# Patient Record
Sex: Male | Born: 1970 | Race: Black or African American | Hispanic: No | Marital: Married | Smoking: Never smoker
Health system: Southern US, Community
[De-identification: ages and names within clinical notes are randomized; demographics above are authoritative.]

## PROBLEM LIST (undated history)

## (undated) DIAGNOSIS — F319 Bipolar disorder, unspecified: Secondary | ICD-10-CM

## (undated) DIAGNOSIS — F419 Anxiety disorder, unspecified: Secondary | ICD-10-CM

## (undated) DIAGNOSIS — E785 Hyperlipidemia, unspecified: Secondary | ICD-10-CM

## (undated) DIAGNOSIS — I10 Essential (primary) hypertension: Secondary | ICD-10-CM

## (undated) HISTORY — PX: OTHER SURGICAL HISTORY: SHX169

---

## 2013-07-01 ENCOUNTER — Encounter (HOSPITAL_COMMUNITY): Payer: Self-pay | Admitting: Emergency Medicine

## 2013-07-01 ENCOUNTER — Emergency Department (HOSPITAL_COMMUNITY)
Admission: EM | Admit: 2013-07-01 | Discharge: 2013-07-01 | Disposition: A | Discharge status: Home / Self Care | Payer: No Typology Code available for payment source | Attending: Emergency Medicine | Admitting: Emergency Medicine

## 2013-07-01 DIAGNOSIS — H18822 Corneal disorder due to contact lens, left eye: Secondary | ICD-10-CM

## 2013-07-01 DIAGNOSIS — Y9389 Activity, other specified: Secondary | ICD-10-CM | POA: Insufficient documentation

## 2013-07-01 DIAGNOSIS — Z79899 Other long term (current) drug therapy: Secondary | ICD-10-CM | POA: Insufficient documentation

## 2013-07-01 DIAGNOSIS — X58XXXA Exposure to other specified factors, initial encounter: Secondary | ICD-10-CM | POA: Insufficient documentation

## 2013-07-01 DIAGNOSIS — H18829 Corneal disorder due to contact lens, unspecified eye: Secondary | ICD-10-CM | POA: Insufficient documentation

## 2013-07-01 DIAGNOSIS — I10 Essential (primary) hypertension: Secondary | ICD-10-CM | POA: Insufficient documentation

## 2013-07-01 DIAGNOSIS — Y9289 Other specified places as the place of occurrence of the external cause: Secondary | ICD-10-CM | POA: Insufficient documentation

## 2013-07-01 DIAGNOSIS — F319 Bipolar disorder, unspecified: Secondary | ICD-10-CM | POA: Insufficient documentation

## 2013-07-01 DIAGNOSIS — S058X9A Other injuries of unspecified eye and orbit, initial encounter: Secondary | ICD-10-CM | POA: Insufficient documentation

## 2013-07-01 HISTORY — DX: Essential (primary) hypertension: I10

## 2013-07-01 HISTORY — DX: Bipolar disorder, unspecified: F31.9

## 2013-07-01 MED ORDER — CIPROFLOXACIN HCL 0.3 % OP SOLN: 1.0000 [drp] | OPHTHALMIC | Status: DC

## 2013-07-01 MED ORDER — TETRACAINE HCL 0.5 % OP SOLN
1.0000 [drp] | Freq: Once | OPHTHALMIC | Status: AC
  Administered 2013-07-01: 1 [drp] via OPHTHALMIC
  Filled 2013-07-01: qty 2

## 2013-07-01 MED ORDER — HYDROCODONE-ACETAMINOPHEN 5-325 MG PO TABS: 1.0000 | ORAL_TABLET | ORAL | Status: DC | PRN

## 2013-07-01 MED ORDER — FLUORESCEIN SODIUM 1 MG OP STRP
1.0000 | ORAL_STRIP | Freq: Once | OPHTHALMIC | Status: AC
  Administered 2013-07-01: 1 via OPHTHALMIC
  Filled 2013-07-01: qty 1

## 2013-07-01 NOTE — ED Notes (Signed)
States woke at 0200 today with left eye irritation. Got up and took out his contact. Left eye has become more irritated and painful.

## 2013-07-01 NOTE — ED Provider Notes (Signed)
CSN: [id number]     Arrival date & time 07/01/13  0935 History     First MD Initiated Contact with Patient 07/01/13 [phone]     Chief Complaint  Patient presents with  . Eye Pain   (Consider location/radiation/quality/duration/timing/severity/associated sxs/prior Treatment) HPI Comments: Patient is a 42 year old male presented to the emergency department with left eye pain that woke patient up this morning. Patient states he then took his contact out and it got more painful. He describes his pain as sharp and scratching and rates his pain 8/10 with no alleviating factors. The patient states his pain is worsened with light. Patient states he had been wearing his contact lens for over a day and typically does wear his lenses at night. Denies any recent contact lens cleaning solution use. Patient endorses that he has very poor vision without his contacts.    Past Medical History  Diagnosis Date  . Bipolar 1 disorder   . Hypertension    No past surgical history on file. No family history on file. History  Substance Use Topics  . Smoking status: Never Smoker   . Smokeless tobacco: Not on file  . Alcohol Use: Yes    Review of Systems  Constitutional: Negative for fever.  Eyes: Positive for pain and redness.  All other systems reviewed and are negative.    Allergies  Review of patient's allergies indicates no known allergies.  Home Medications   Current Outpatient Rx  Name  Route  Sig  Dispense  Refill  . ARIPiprazole (ABILIFY) 20 MG tablet   Oral   Take 20 mg by mouth daily.         . Armodafinil (NUVIGIL) 250 MG tablet   Oral   Take 250 mg by mouth daily.         Marland Kitchen atorvastatin (LIPITOR) 40 MG tablet   Oral   Take 40 mg by mouth daily.         Marland Kitchen buPROPion (WELLBUTRIN XL) 150 MG 24 hr tablet   Oral   Take 150 mg by mouth daily.         . Eszopiclone 3 MG TABS   Oral   Take 3 mg by mouth at bedtime. Take immediately before bedtime         . lamoTRIgine  (LAMICTAL) 200 MG tablet   Oral   Take 200 mg by mouth daily.         . Vilazodone HCl (VIIBRYD) 40 MG TABS   Oral   Take 40 mg by mouth daily.         . ciprofloxacin (CILOXAN) 0.3 % ophthalmic solution   Ophthalmic   Apply 1 drop to eye every 4 (four) hours. Place two drops in your left eye every 15 minutes for the first 6 hours followed by two drops in your left eye every 30 minutes for the rest of the first day. On day 2 place 2 drops in your left eye hourly. On days 3-14 place two drops every 4 hours.   2.5 mL   1   . HYDROcodone-acetaminophen (NORCO/VICODIN) 5-325 MG per tablet   Oral   Take 1 tablet by mouth every 4 (four) hours as needed for pain.   10 tablet   0    BP 141/89  Pulse 90  Temp(Src) 98.6 F (37 C) (Oral)  SpO2 96% Physical Exam  Constitutional: He is oriented to person, place, and time. He appears well-developed and well-nourished. No distress.  HENT:  Head: Normocephalic and atraumatic.  Eyes: EOM are normal. Pupils are equal, round, and reactive to light. No foreign body present in the left eye. Left conjunctiva is injected.  Fundoscopic exam:      The right eye shows no hemorrhage and no papilledema.       The left eye shows no hemorrhage and no papilledema.  Slit lamp exam:      The left eye shows corneal abrasion.  OD: 20/30 (with contact lens use) OS: 20/200 (no contact lens or eye glass use)   Neck: Neck supple.  Neurological: He is alert and oriented to person, place, and time.  Skin: Skin is warm and dry. He is not diaphoretic.  Psychiatric: He has a normal mood and affect.    ED Course   Procedures (including critical care time)  Labs Reviewed - No data to display No results found. 1. Corneal abrasion due to contact lens, left     MDM  Pt with corneal abrasion on PE most likely d/t contact lens use. Eye irrigated w NS, no evidence of FB. VA intact in OD (20/30) w/ contact lens use. OS: 20/200, but patient typically wears  contact lenses and/or glasses for distance and did not have such for VA testing today. Patient did endorse poor vision w/o corrective lenses. Pain did improve after tetracaine administration. Patient will be discharged home with cipro drops.   Patient understands to follow up with ophthalmology ASAP and advised to not wear contact lenses until cleared by ophthomologist, & to return to ER if new symptoms develop including change in vision, purulent drainage, or entrapment.Patient is agreeable to plan. Patient is stable at time of discharge      Jeannetta Ellis, PA-C 07/01/13 1405

## 2013-07-01 NOTE — ED Provider Notes (Signed)
Medical screening examination/treatment/procedure(s) were performed by non-physician practitioner and as supervising physician I was immediately available for consultation/collaboration.  Raeford Razor, MD 07/01/13 [phone]

## 2015-07-20 ENCOUNTER — Encounter (HOSPITAL_COMMUNITY): Payer: Self-pay | Admitting: Emergency Medicine

## 2015-07-20 ENCOUNTER — Emergency Department (HOSPITAL_COMMUNITY)
Admission: EM | Admit: 2015-07-20 | Discharge: 2015-07-21 | Disposition: A | Discharge status: Home / Self Care | Payer: No Typology Code available for payment source | Attending: Emergency Medicine | Admitting: Emergency Medicine

## 2015-07-20 DIAGNOSIS — T426X2A Poisoning by other antiepileptic and sedative-hypnotic drugs, intentional self-harm, initial encounter: Secondary | ICD-10-CM | POA: Insufficient documentation

## 2015-07-20 DIAGNOSIS — T50902A Poisoning by unspecified drugs, medicaments and biological substances, intentional self-harm, initial encounter: Secondary | ICD-10-CM

## 2015-07-20 DIAGNOSIS — Y9289 Other specified places as the place of occurrence of the external cause: Secondary | ICD-10-CM | POA: Diagnosis not present

## 2015-07-20 DIAGNOSIS — F39 Unspecified mood [affective] disorder: Secondary | ICD-10-CM | POA: Diagnosis not present

## 2015-07-20 DIAGNOSIS — T424X2A Poisoning by benzodiazepines, intentional self-harm, initial encounter: Secondary | ICD-10-CM | POA: Diagnosis not present

## 2015-07-20 DIAGNOSIS — F1310 Sedative, hypnotic or anxiolytic abuse, uncomplicated: Secondary | ICD-10-CM | POA: Insufficient documentation

## 2015-07-20 DIAGNOSIS — I10 Essential (primary) hypertension: Secondary | ICD-10-CM | POA: Insufficient documentation

## 2015-07-20 DIAGNOSIS — Z79899 Other long term (current) drug therapy: Secondary | ICD-10-CM | POA: Insufficient documentation

## 2015-07-20 DIAGNOSIS — Y9389 Activity, other specified: Secondary | ICD-10-CM | POA: Insufficient documentation

## 2015-07-20 DIAGNOSIS — F419 Anxiety disorder, unspecified: Secondary | ICD-10-CM | POA: Diagnosis not present

## 2015-07-20 DIAGNOSIS — Y998 Other external cause status: Secondary | ICD-10-CM | POA: Insufficient documentation

## 2015-07-20 DIAGNOSIS — F319 Bipolar disorder, unspecified: Secondary | ICD-10-CM | POA: Insufficient documentation

## 2015-07-20 LAB — COMPREHENSIVE METABOLIC PANEL
ALK PHOS: 79 U/L (ref 38–126)
ALT: 51 U/L (ref 17–63)
ANION GAP: 8 (ref 5–15)
AST: 37 U/L (ref 15–41)
Albumin: 4.9 g/dL (ref 3.5–5.0)
BILIRUBIN TOTAL: 0.6 mg/dL (ref 0.3–1.2)
BUN: 7 mg/dL (ref 6–20)
CALCIUM: 10.4 mg/dL — AB (ref 8.9–10.3)
CO2: 30 mmol/L (ref 22–32)
CREATININE: 1.3 mg/dL — AB (ref 0.61–1.24)
Chloride: 103 mmol/L (ref 101–111)
GFR calc non Af Amer: >60 mL/min (ref >60)
GLUCOSE: 101 mg/dL — AB (ref 65–99)
Potassium: 4.3 mmol/L (ref 3.5–5.1)
Sodium: 141 mmol/L (ref 135–145)
TOTAL PROTEIN: 8.7 g/dL — AB (ref 6.5–8.1)

## 2015-07-20 LAB — URINALYSIS, ROUTINE W REFLEX MICROSCOPIC
BILIRUBIN URINE: NEGATIVE
GLUCOSE, UA: NEGATIVE mg/dL
HGB URINE DIPSTICK: NEGATIVE
Ketones, ur: NEGATIVE mg/dL
Leukocytes, UA: NEGATIVE
Nitrite: NEGATIVE
Protein, ur: NEGATIVE mg/dL
SPECIFIC GRAVITY, URINE: 1.007 (ref 1.005–1.030)
UROBILINOGEN UA: 0.2 mg/dL (ref 0.0–1.0)
pH: 6 (ref 5.0–8.0)

## 2015-07-20 LAB — RAPID URINE DRUG SCREEN, HOSP PERFORMED
AMPHETAMINES: NOT DETECTED
BARBITURATES: NOT DETECTED
Benzodiazepines: POSITIVE — AB
Cocaine: NOT DETECTED
OPIATES: NOT DETECTED
TETRAHYDROCANNABINOL: NOT DETECTED

## 2015-07-20 LAB — CBC WITH DIFFERENTIAL/PLATELET
Basophils Absolute: 0 10*3/uL (ref 0.0–0.1)
Basophils Relative: 0 % (ref 0–1)
Eosinophils Absolute: 0.1 10*3/uL (ref 0.0–0.7)
Eosinophils Relative: 1 % (ref 0–5)
HEMATOCRIT: 47.6 % (ref 39.0–52.0)
HEMOGLOBIN: 16.4 g/dL (ref 13.0–17.0)
LYMPHS ABS: 2.3 10*3/uL (ref 0.7–4.0)
LYMPHS PCT: 34 % (ref 12–46)
MCH: 31.3 pg (ref 26.0–34.0)
MCHC: 34.5 g/dL (ref 30.0–36.0)
MCV: 90.8 fL (ref 78.0–100.0)
MONOS PCT: 3 % (ref 3–12)
Monocytes Absolute: 0.2 10*3/uL (ref 0.1–1.0)
NEUTROS ABS: 4.2 10*3/uL (ref 1.7–7.7)
NEUTROS PCT: 62 % (ref 43–77)
Platelets: 218 10*3/uL (ref 150–400)
RBC: 5.24 MIL/uL (ref 4.22–5.81)
RDW: 12.8 % (ref 11.5–15.5)
WBC: 6.8 10*3/uL (ref 4.0–10.5)

## 2015-07-20 LAB — LIPASE, BLOOD: Lipase: 25 U/L (ref 22–51)

## 2015-07-20 LAB — SALICYLATE LEVEL

## 2015-07-20 LAB — ACETAMINOPHEN LEVEL

## 2015-07-20 MED ORDER — GI COCKTAIL ~~LOC~~: 30.0000 mL | Freq: Once | ORAL | Status: DC

## 2015-07-20 NOTE — ED Notes (Signed)
Pt. Noted in room. No complaints or concerns voiced. No distress or abnormal behavior noted. Will continue to monitor with security cameras. Q 15 minute rounds continue. 

## 2015-07-20 NOTE — ED Provider Notes (Addendum)
CSN: [id number]     Arrival date & time 07/20/15  1034 History   First MD Initiated Contact with Patient 07/20/15 1048     Chief Complaint  Patient presents with  . Drug Overdose     (Consider location/radiation/quality/duration/timing/severity/associated sxs/prior Treatment) Patient is a 44 y.o. male presenting with Overdose.  Drug Overdose This is a new problem. The current episode started yesterday. The problem occurs constantly. The problem has not changed since onset.Pertinent negatives include no chest pain, no abdominal pain, no headaches and no shortness of breath. Nothing aggravates the symptoms. Nothing relieves the symptoms. He has tried nothing for the symptoms. The treatment provided no relief.   44 yo M with a chief complaint of a drug overdose. Patient took 4 4 mg Lunesta, as well as for half milligram Xanax tablets. He stated today's an attempt to kill himself. States he's been having off and on lower crampy abdominal pain, for the past couple weeks. Patient takes associated with some nausea. Denies vomiting. Denies fevers or chills. Sinus PCP yesterday and was prescribed omeprazole, so far no relief. Patient has been manic for the past 3 or 4 days. Patient is been without his Seroquel for that amount of time. States he spent more than $3500.   Past Medical History  Diagnosis Date  . Bipolar 1 disorder   . Hypertension    History reviewed. No pertinent past surgical history. No family history on file. Social History  Substance Use Topics  . Smoking status: Never Smoker   . Smokeless tobacco: None  . Alcohol Use: Yes    Review of Systems  Constitutional: Negative for fever and chills.  HENT: Negative for congestion and facial swelling.   Eyes: Negative for discharge and visual disturbance.  Respiratory: Negative for shortness of breath.   Cardiovascular: Negative for chest pain and palpitations.  Gastrointestinal: Negative for vomiting, abdominal pain and diarrhea.   Musculoskeletal: Negative for myalgias and arthralgias.  Skin: Negative for color change and rash.  Neurological: Negative for tremors, syncope and headaches.  Psychiatric/Behavioral: Negative for confusion and dysphoric mood.      Allergies  Review of patient's allergies indicates no known allergies.  Home Medications   Prior to Admission medications   Medication Sig Start Date End Date Taking? Authorizing Provider  ALPRAZolam Prudy Feeler) 0.5 MG tablet Take 0.5 mg by mouth daily as needed. 05/14/15  Yes Historical Provider, MD  Armodafinil (NUVIGIL) 250 MG tablet Take 250 mg by mouth daily.   Yes Historical Provider, MD  atorvastatin (LIPITOR) 40 MG tablet Take 40 mg by mouth daily.   Yes Historical Provider, MD  buPROPion (WELLBUTRIN XL) 150 MG 24 hr tablet Take 150 mg by mouth daily.   Yes Historical Provider, MD  Eszopiclone 3 MG TABS Take 3 mg by mouth at bedtime. Take immediately before bedtime   Yes Historical Provider, MD  lamoTRIgine (LAMICTAL) 200 MG tablet Take 200 mg by mouth daily.   Yes Historical Provider, MD  omeprazole (PRILOSEC) 20 MG capsule Take 20 mg by mouth daily. 07/18/15  Yes Historical Provider, MD  SEROQUEL XR 400 MG 24 hr tablet Take 800 mg by mouth at bedtime as needed. 07/19/15  Yes Historical Provider, MD  valACYclovir (VALTREX) 500 MG tablet Take 500 mg by mouth. Takes for 5 days 05/25/15  Yes Historical Provider, MD  ciprofloxacin (CILOXAN) 0.3 % ophthalmic solution Apply 1 drop to eye every 4 (four) hours. Place two drops in your left eye every 15 minutes for the  first 6 hours followed by two drops in your left eye every 30 minutes for the rest of the first day. On day 2 place 2 drops in your left eye hourly. On days 3-14 place two drops every 4 hours. Patient not taking: Reported on 07/20/2015 07/01/13   Francee Piccolo, PA-C  HYDROcodone-acetaminophen (NORCO/VICODIN) 5-325 MG per tablet Take 1 tablet by mouth every 4 (four) hours as needed for pain. Patient  not taking: Reported on 07/20/2015 07/01/13   Victorino Dike Piepenbrink, PA-C   BP 115/92 mmHg  Pulse 76  Temp(Src) 98.3 F (36.8 C) (Oral)  Resp 18  Ht  (1.803 m)  Wt 217 lb (98.431 kg)  BMI 30.28 kg/m2  SpO2 98% Physical Exam  Constitutional: He is oriented to person, place, and time. He appears well-developed and well-nourished.  HENT:  Head: Normocephalic and atraumatic.  Eyes: EOM are normal. Pupils are equal, round, and reactive to light.  Neck: Normal range of motion. Neck supple. No JVD present.  Cardiovascular: Normal rate and regular rhythm.  Exam reveals no gallop and no friction rub.   No murmur heard. Pulmonary/Chest: No respiratory distress. He has no wheezes.  Abdominal: He exhibits no distension. There is no rebound and no guarding.  Musculoskeletal: Normal range of motion.  Neurological: He is alert and oriented to person, place, and time.  Skin: No rash noted. No pallor.  Psychiatric: He has a normal mood and affect. His behavior is normal.    ED Course  Procedures (including critical care time) Labs Review Labs Reviewed  COMPREHENSIVE METABOLIC PANEL - Abnormal; Notable for the following:    Glucose, Bld 101 (*)    Creatinine, Ser 1.30 (*)    Calcium 10.4 (*)    Total Protein 8.7 (*)    All other components within normal limits  URINE RAPID DRUG SCREEN, HOSP PERFORMED - Abnormal; Notable for the following:    Benzodiazepines POSITIVE (*)    All other components within normal limits  ACETAMINOPHEN LEVEL - Abnormal; Notable for the following:    Acetaminophen (Tylenol), Serum <10 (*)    All other components within normal limits  CBC WITH DIFFERENTIAL/PLATELET  LIPASE, BLOOD  URINALYSIS, ROUTINE W REFLEX MICROSCOPIC (NOT AT [facility])  SALICYLATE LEVEL    Imaging Review No results found. I have personally reviewed and evaluated these images and lab results as part of my medical decision-making.   EKG Interpretation None      MDM   Final diagnoses:   Overdose, intentional self-harm, initial encounter    44 yo M with a chief complaint of suicidal ideation drug overdose. Patient also having lower abdominal pain. Benign abdominal exam. Will check abdominal labs. Patient very alert for metastatic medication that he took. Feel possible patient did not take these medications.  Evaluated by psych, will observe for 24 hours and reassess in the morning.    Melene Plan, DO 07/20/15 1416  Melene Plan, DO 07/20/15 1416

## 2015-07-20 NOTE — ED Notes (Signed)
Report received from Karen RN. Pt. Sleeping, respirations regular and unlabored. Will continue to monitor for safety via security cameras and Q 15 minute checks. 

## 2015-07-20 NOTE — Discharge Instructions (Signed)
Accidental Overdose  A drug overdose occurs when a chemical substance (drug or medication) is used in amounts large enough to overcome a person. This may result in severe illness or death. This is a type of poisoning. Accidental overdoses of medications or other substances come from a variety of reasons. When this happens accidentally, it is often because the person taking the substance does not know enough about what they have taken. Drugs which commonly cause overdose deaths are alcohol, psychotropic medications (medications which affect the mind), pain medications, illegal drugs (street drugs) such as cocaine and heroin, and multiple drugs taken at the same time. It may result from careless behavior (such as over-indulging at a party). Other causes of overdose may include multiple drug use, a lapse in memory, or drug use after a period of no drug use.   Sometimes overdosing occurs because a person cannot remember if they have taken their medication.   A common unintentional overdose in young children involves multi-vitamins containing iron. Iron is a part of the hemoglobin molecule in blood. It is used to transport oxygen to living cells. When taken in small amounts, iron allows the body to restock hemoglobin. In large amounts, it causes problems in the body. If this overdose is not treated, it can lead to death.  Never take medicines that show signs of tampering or do not seem quite right. Never take medicines in the dark or in poor lighting. Read the label and check each dose of medicine before you take it. When adults are poisoned, it happens most often through carelessness or lack of information. Taking medicines in the dark or taking medicine prescribed for someone else to treat the same type of problem is a dangerous practice.  SYMPTOMS   Symptoms of overdose depend on the medication and amount taken. They can vary from over-activity with stimulant over-dosage, to sleepiness from depressants such as  alcohol, narcotics and tranquilizers. Confusion, dizziness, nausea and vomiting may be present. If problems are severe enough coma and death may result.  DIAGNOSIS   Diagnosis and management are generally straightforward if the drug is known. Otherwise it is more difficult. At times, certain symptoms and signs exhibited by the patient, or blood tests, can reveal the drug in question.   TREATMENT   In an emergency department, most patients can be treated with supportive measures. Antidotes may be available if there has been an overdose of opioids or benzodiazepines. A rapid improvement will often occur if this is the cause of overdose.  At home or away from medical care:   There may be no immediate problems or warning signs in children.   Not everything works well in all cases of poisoning.   Take immediate action. Poisons may act quickly.   If you think someone has swallowed medicine or a household product, and the person is unconscious, having seizures (convulsions), or is not breathing, immediately call for an ambulance.  IF a person is conscious and appears to be doing OK but has swallowed a poison:   Do not wait to see what effect the poison will have. Immediately call a poison control center (listed in the white pages of your telephone book under "Poison Control" or inside the front cover with other emergency numbers). Some poison control centers have TTY capability for the deaf. Check with your local center if you or someone in your family requires this service.   Keep the container so you can read the label on the product for ingredients.     Describe what, when, and how much was taken and the age and condition of the person poisoned. Inform them if the person is vomiting, choking, drowsy, shows a change in color or temperature of skin, is conscious or unconscious, or is convulsing.   Do not cause vomiting unless instructed by medical personnel. Do not induce vomiting or force liquids into a person who  is convulsing, unconscious, or very drowsy.  Stay calm and in control.    Activated charcoal also is sometimes used in certain types of poisoning and you may wish to add a supply to your emergency medicines. It is available without a prescription. Call a poison control center before using this medication.  PREVENTION   Thousands of children die every year from unintentional poisoning. This may be from household chemicals, poisoning from carbon monoxide in a car, taking their parent's medications, or simply taking a few iron pills or vitamins with iron. Poisoning comes from unexpected sources.   Store medicines out of the sight and reach of children, preferably in a locked cabinet. Do not keep medications in a food cabinet. Always store your medicines in a secure place. Get rid of expired medications.   If you have children living with you or have them as occasional guests, you should have child-resistant caps on your medicine containers. Keep everything out of reach. Child proof your home.   If you are called to the telephone or to answer the door while you are taking a medicine, take the container with you or put the medicine out of the reach of small children.   Do not take your medication in front of children. Do not tell your child how good a medication is and how good it is for them. They may get the idea it is more of a treat.   If you are an adult and have accidentally taken an overdose, you need to consider how this happened and what can be done to prevent it from happening again. If this was from a street drug or alcohol, determine if there is a problem that needs addressing. If you are not sure a problems exists, it is easy to talk to a professional and ask them if they think you have a problem. It is better to handle this problem in this way before it happens again and has a much worse consequence.  Document Released: 01/17/2005 Document Revised: 01/26/2012 Document Reviewed: 06/25/2009  [employer]  Patient Information 2015 [employer]. This information is not intended to replace advice given to you by your health care provider. Make sure you discuss any questions you have with your health care provider.

## 2015-07-20 NOTE — ED Notes (Signed)
Pt. Noted sleeping in room. No complaints or concerns voiced. No distress or abnormal behavior noted. Will continue to monitor with security cameras. Q 15 minute rounds continue. 

## 2015-07-20 NOTE — BH Assessment (Addendum)
Tele Assessment Note   Michael Mclean is an 44 y.o. male. Patient is a 44 year old male that presents this date with Bipolar D/O and over dosed on his anxiety/sleep medications. Patient stated that he had been having stomach issues for the last few weeks and increased anxiety from work feeling very over whelmed. Patient admitted to taking 4 tablets of Xanax and 4 tablets of Lunesta this date stating he, "didn't care if he woke up or not." Patient stated he has been so frustrated with excessive debt from a recent bipolar episode where he charged a large amount on his credit cards that he did not want to "face life anymore." Patient denies any more thoughts of self harm this date and was remorseful in reference to his actions. Patients denies any SA issues but does admit to being off his Bipolar medications for the last week due to him forgetting to pick them up. Patient stated he would like to go home and stated he had no further thoughts of self harm. Patient also stated that they would make sure they obtained their medication/s and was educated on the importance of medication compliance.Collateral information obtained from his girlfriend (who was present and signed a release) confirmed that patient had not been on his medications for some time and had noticed patient had been decompensating. This Clinical research associate staffed the case with Michael Nottingham NP who after reviewing the above stated patient will be monitored for the next 24 hours and re-evaluated in the a.m. Patient agreed and this Clinical research associate informed ED MD Michael Mclean of the disposition.       Axis I: 296.40 Bipolar Axis II: Deferred Axis III: GERD Past Medical History  Diagnosis Date  . Bipolar 1 disorder   . Hypertension    Axis IV: occupational problems and problems related to social environment Axis V: 51-60 moderate symptoms  Past Medical History:  Past Medical History  Diagnosis Date  . Bipolar 1 disorder   . Hypertension     History reviewed. No pertinent  past surgical history.  Family History: No family history on file.  Social History:  reports that he has never smoked. He does not have any smokeless tobacco history on file. He reports that he drinks alcohol. He reports that he does not use illicit drugs.  Additional Social History:  Alcohol / Drug Use Pain Medications: None noted see MAR Prescriptions: See MAR Over the Counter: See MAR History of alcohol / drug use?: No history of alcohol / drug abuse  CIWA: CIWA-Ar BP: 132/91 mmHg Pulse Rate: 88 COWS:    PATIENT STRENGTHS: (choose at least two) Ability for insight Average or above average intelligence Capable of independent living Communication skills Religious Affiliation Supportive family/friends  Allergies: No Known Allergies  Home Medications:  (Not in a hospital admission)  OB/GYN Status:  No LMP for male patient.  General Assessment Data Location of Assessment: [facility] TTS Assessment: In system Is this a Tele or Face-to-Face Assessment?: Face-to-Face Is this an Initial Assessment or a Re-assessment for this encounter?: Initial Assessment Marital status: Long term relationship Michael Mclean name: NA Is patient pregnant?: No Pregnancy Status: No Living Arrangements: Spouse/significant other Can pt return to current living arrangement?: Yes Admission Status: Voluntary Is patient capable of signing voluntary admission?: Yes Referral Source: Self/Family/Friend Insurance type: Proofreader Exam [facility] Walk-in ONLY) Medical Exam completed: Yes  Crisis Care Plan Living Arrangements: Spouse/significant other Name of Psychiatrist: Tiajuana Mclean Name of Therapist: None at this time  Education Status Is patient currently in school?: No Current Grade: NA Highest grade of school patient has completed: BS Name of school: [employer] Contact person: Michael Mclean  Risk to self with the past 6 months Suicidal Ideation: No Has patient been a risk to  self within the past 6 months prior to admission? : No Suicidal Intent: No Has patient had any suicidal intent within the past 6 months prior to admission? : Yes Is patient at risk for suicide?: No (Patient overdosed on anxiety/sleep medications) Suicidal Plan?: No Has patient had any suicidal plan within the past 6 months prior to admission? : No Access to Means: Yes Specify Access to Suicidal Means: Medications What has been your use of drugs/alcohol within the last 12 months?: None Previous Attempts/Gestures: No How many times?: 0 Other Self Harm Risks: None noted Triggers for Past Attempts: Other (Comment) Barrister's clerk) Intentional Self Injurious Behavior: None Family Suicide History: No Recent stressful life event(s): Financial Problems Persecutory voices/beliefs?: No Depression: No Depression Symptoms: Feeling angry/irritable Substance abuse history and/or treatment for substance abuse?: No Suicide prevention information given to non-admitted patients: Not applicable  Risk to Others within the past 6 months Homicidal Ideation: No Does patient have any lifetime risk of violence toward others beyond the six months prior to admission? : No Thoughts of Harm to Others: No Current Homicidal Intent: No Current Homicidal Plan: No Access to Homicidal Means: No Identified Victim: NA History of harm to others?: No Assessment of Violence: None Noted Violent Behavior Description: None Does patient have access to weapons?: No Criminal Charges Pending?: No Does patient have a court date: No Is patient on probation?: No  Psychosis Hallucinations: None noted Delusions: None noted  Mental Status Report Appearance/Hygiene: In scrubs Eye Contact: Fair Motor Activity: Unremarkable Speech: Unremarkable Level of Consciousness: Drowsy Mood: Sullen Affect: Sullen Anxiety Level: Minimal Thought Processes: Coherent Judgement: Unimpaired Orientation: Person, Place, Time,  Situation Obsessive Compulsive Thoughts/Behaviors: None  Cognitive Functioning Concentration: Fair Memory: Recent Intact, Remote Intact IQ: Above Average Insight: Good Impulse Control: Good Appetite: Good Weight Loss: 0 Weight Gain: 0 Sleep: No Change Total Hours of Sleep: 8 Vegetative Symptoms: None  ADLScreening [facility] Assessment Services) Patient's cognitive ability adequate to safely complete daily activities?: Yes Patient able to express need for assistance with ADLs?: Yes Independently performs ADLs?: Yes (appropriate for developmental age)  Prior Inpatient Therapy Prior Inpatient Therapy: No Prior Therapy Dates: None Prior Therapy Facilty/Provider(s): [facility] Reason for Treatment: Bipolar  Prior Outpatient Therapy Prior Outpatient Therapy: No Prior Therapy Dates: NA Prior Therapy Facilty/Provider(s): [facility] Reason for Treatment: Bipolar Does patient have an ACCT team?: No Does patient have Intensive In-House Services?  : No Does patient have [facility] services? : No Does patient have P4CC services?: No  ADL Screening (condition at time of admission) Patient's cognitive ability adequate to safely complete daily activities?: Yes Is the patient deaf or have difficulty hearing?: No Does the patient have difficulty seeing, even when wearing glasses/contacts?: No Does the patient have difficulty concentrating, remembering, or making decisions?: No Patient able to express need for assistance with ADLs?: Yes Does the patient have difficulty dressing or bathing?: No Independently performs ADLs?: Yes (appropriate for developmental age) Weakness of Legs: None Weakness of Arms/Hands: None  Home Assistive Devices/Equipment Home Assistive Devices/Equipment: None  Therapy Consults (therapy consults require a physician order) PT Evaluation Needed: No OT Evalulation Needed: No SLP Evaluation Needed: No Abuse/Neglect Assessment (Assessment to be complete while patient  is alone) Physical Abuse: Denies Verbal Abuse:  Denies Sexual Abuse: Denies Exploitation of patient/patient's resources: Denies Self-Neglect: Denies Values / Beliefs Cultural Requests During Hospitalization: None Spiritual Requests During Hospitalization: None Consults Spiritual Care Consult Needed: No Social Work Consult Needed: No Merchant navy officer (For Healthcare) Does patient have an advance directive?: No Would patient like information on creating an advanced directive?: No - patient declined information    Additional Information 1:1 In Past 12 Months?: No CIRT Risk: No Elopement Risk: No Does patient have medical clearance?: Yes     Disposition: This Clinical research associate staffed the case with Michael Nottingham NP who after reviewing the above stated patient will be monitored for the next 24 hours and re-evaluated in the a.m. Patient agreed and this Clinical research associate informed ED MD Michael Mclean of the disposition.       Disposition Initial Assessment Completed for this Encounter: Yes Disposition of Patient: Other dispositions (Re-evaluate in 24 hours) Other disposition(s): Other (Comment) (Re-evaluate in 24 hours)  Alfredia Ferguson 07/20/2015 1:55 PM

## 2015-07-20 NOTE — ED Notes (Signed)
Bed: [facility] Expected date:  Expected time:  Means of arrival:  Comments: Seroquel, lunesta, and xanax

## 2015-07-20 NOTE — ED Notes (Signed)
Pt was oriented to unit. Explained security monitoring. He denied SI/HI/AVH and pain. Gave him a blanket, sandwich, snack, and Ginger Ale. Will continue to monitor for needs/safety.

## 2015-07-20 NOTE — ED Notes (Signed)
From MD office (psych) via EMS, pt reports 8 4 mg Lunesta in 12 hr (4 two hours ago) and 6-8 0.5 Xanax pta, c/o abd pain and nausea,  Zofran pta, EMS reports SI pta, pt endorses manic behavior, denies HI/SI on arrival.

## 2015-07-20 NOTE — ED Notes (Signed)
Per poison control, CNS and resp depression, labs, EKG, airway support, obs 6-8 hours or until at baseline

## 2015-07-20 NOTE — ED Notes (Signed)
Spoke with Collene Leyden RN from [employer]. She was satisfied with his vitals, ECG, and labs. No further follow-up.

## 2015-07-21 DIAGNOSIS — F419 Anxiety disorder, unspecified: Secondary | ICD-10-CM | POA: Diagnosis not present

## 2015-07-21 DIAGNOSIS — F319 Bipolar disorder, unspecified: Secondary | ICD-10-CM | POA: Insufficient documentation

## 2015-07-21 DIAGNOSIS — F39 Unspecified mood [affective] disorder: Secondary | ICD-10-CM | POA: Diagnosis not present

## 2015-07-21 NOTE — [facility] Suicide Risk Assessment (Cosign Needed)
Suicide Risk Assessment  Discharge Assessment   [facility] Discharge Suicide Risk Assessment   Demographic Factors:  Male  Total Time spent with patient: 20 minutes  Musculoskeletal: Strength & Muscle Tone: within normal limits Gait & Station: normal Patient leans: N/A  Psychiatric Specialty Exam:     Blood pressure 125/87, pulse 77, temperature 98.4 F (36.9 C), temperature source Oral, resp. rate 18, height  (1.803 m), weight 98.431 kg (217 lb), SpO2 100 %.Body mass index is 30.28 kg/(m^2).  General Appearance: Casual and Fairly Groomed  Patent attorney::  Good  Speech:  Clear and Coherent and Normal Rate  Volume:  Normal  Mood:  Anxious  Affect:  Congruent  Thought Process:  Coherent, Goal Directed and Intact  Orientation:  Full (Time, Place, and Person)  Thought Content:  WDL  Suicidal Thoughts:  No  Homicidal Thoughts:  No  Memory:  Immediate;   Good Recent;   Good Remote;   Good  Judgement:  Intact  Insight:  Good  Psychomotor Activity:  Normal  Concentration:  Good  Recall:  NA  Fund of Knowledge:Good  Language: Good  Akathisia:  No  Handed:  Right  AIMS (if indicated):     Assets:  Desire for Improvement  ADL's:  Intact  Cognition: WNL        Has this patient used any form of tobacco in the last 30 days? (Cigarettes, Smokeless Tobacco, Cigars, and/or Pipes) N/A  Mental Status Per Nursing Assessment::   On Admission:     Current Mental Status by Physician: NA  Loss Factors: NA  Historical Factors: NA  Risk Reduction Factors:   Sense of responsibility to family, Religious beliefs about death, Employed, Living with another person, especially a relative and Positive therapeutic relationship  Continued Clinical Symptoms:  Severe Anxiety and/or Agitation Bipolar Disorder:   Depressive phase  Cognitive Features That Contribute To Risk:  Polarized thinking    Suicide Risk:  Minimal: No identifiable suicidal ideation.  Patients presenting with no  risk factors but with morbid ruminations; may be classified as minimal risk based on the severity of the depressive symptoms  Principal Problem: Mood disorder Discharge Diagnoses:  Patient Active Problem List   Diagnosis Date Noted  . Mood disorder [F39] 07/21/2015    Priority: High  . Anxiety disorder [F41.9]   . Bipolar affective disorder [F31.9]     Follow-up Information    Schedule an appointment as soon as possible for a visit with PARTRIDGE,JAMES, MD.   Specialty:  Internal Medicine      Plan Of Care/Follow-up recommendations:  Activity:  as tolerated Diet:  regular  Is patient on multiple antipsychotic therapies at discharge:  No   Has Patient had three or more failed trials of antipsychotic monotherapy by history:  No  Recommended Plan for Multiple Antipsychotic Therapies: NA    Parlee Amescua C   PMHNP-BC 07/21/2015, 12:56 PM

## 2015-07-21 NOTE — ED Notes (Signed)
Pt. Noted sleeping in room. No complaints or concerns voiced. No distress or abnormal behavior noted. Will continue to monitor with security cameras. Q 15 minute rounds continue. 

## 2015-07-21 NOTE — Consult Note (Signed)
Michael Mclean   Reason for Mclean: Unspecified mood disorder, Anxiety disorder, Bipolar 1 disorder hx Referring Physician:  EDP Patient Identification: Michael Mclean MRN:  [id number] Principal Diagnosis: Mood disorder Diagnosis:   Patient Active Problem List   Diagnosis Date Noted  . Mood disorder [F39] 07/21/2015    Priority: High    Total Time spent with patient: 1 hour  Subjective:   Michael Mclean is a 44 y.o. male patient admitted with Unspecified mood disorder, Anxiety disorder, Bipolar 1 disorder hx.  HPI:  AA male, 44 years old was evaluated this morning for anxiety and taking extra dose of his sleeping and anxiety medication.  He vehemently denies taking these extra 2 pills each to kill himself but stated that he was anxious and could not sleep.  Patient admits to a hx of Bipolar disorder and insomnia.  His Seroquel was increased to 800 mg at night time with the addition of Xanax and Lunester for anxiety and sleep respectively.  He also started a new medications for GI discomfort.  He states that he panicked after he took the first dose of the GI medications because his pain and discomfort got even worse.  He panicked and took 2 extra dose of Xanax and Lunester to allow him sleep and relax.  He denies SI/HI/AVH and denies previous suicide attempt.  He denies feeling hopeless or helpless and he started seeing DR Clovis Pu at [facility] for his [facility] care.  Patient has an upcoming  appointment with Dr Clovis Pu later this month.  Patient is discharged home now.  HPI Elements:   Location:  Unspecified mood disorder, Anxiety disorder, Bipolar disorder 1, . Quality:  Moderate-severe. Severity:  Moderate-severe. Timing:  Acute. Context:  Came in seeking assistance for anxiety..  Past Medical History:  Past Medical History  Diagnosis Date  . Bipolar 1 disorder   . Hypertension    History reviewed. No pertinent past surgical history. Family History: No family  history on file. Social History:  History  Alcohol Use  . Yes     History  Drug Use No    Social History   Social History  . Marital Status: Single    Spouse Name: N/A  . Number of Children: N/A  . Years of Education: N/A   Social History Main Topics  . Smoking status: Never Smoker   . Smokeless tobacco: None  . Alcohol Use: Yes  . Drug Use: No  . Sexual Activity: Not Asked   Other Topics Concern  . None   Social History Narrative   Additional Social History:    Pain Medications: None noted see MAR Prescriptions: See MAR Over the Counter: See MAR History of alcohol / drug use?: No history of alcohol / drug abuse                     Allergies:  No Known Allergies  Labs:  Results for orders placed or performed during the hospital encounter of 07/20/15 (from the past 48 hour(s))  Urinalysis, Routine w reflex microscopic (not at [facility])     Status: None   Collection Time: 07/20/15 11:20 AM  Result Value Ref Range   Color, Urine YELLOW YELLOW   APPearance CLEAR CLEAR   Specific Gravity, Urine 1.007 1.005 - 1.030   pH 6.0 5.0 - 8.0   Glucose, UA NEGATIVE NEGATIVE mg/dL   Hgb urine dipstick NEGATIVE NEGATIVE   Bilirubin Urine NEGATIVE NEGATIVE   Ketones, ur NEGATIVE NEGATIVE  mg/dL   Protein, ur NEGATIVE NEGATIVE mg/dL   Urobilinogen, UA 0.2 0.0 - 1.0 mg/dL   Nitrite NEGATIVE NEGATIVE   Leukocytes, UA NEGATIVE NEGATIVE    Comment: MICROSCOPIC NOT DONE ON URINES WITH NEGATIVE PROTEIN, BLOOD, LEUKOCYTES, NITRITE, OR GLUCOSE <1000 mg/dL.  Urine rapid drug screen (hosp performed)     Status: Abnormal   Collection Time: 07/20/15 11:20 AM  Result Value Ref Range   Opiates NONE DETECTED NONE DETECTED   Cocaine NONE DETECTED NONE DETECTED   Benzodiazepines POSITIVE (A) NONE DETECTED   Amphetamines NONE DETECTED NONE DETECTED   Tetrahydrocannabinol NONE DETECTED NONE DETECTED   Barbiturates NONE DETECTED NONE DETECTED    Comment:        DRUG SCREEN FOR MEDICAL  PURPOSES ONLY.  IF CONFIRMATION IS NEEDED FOR ANY PURPOSE, NOTIFY LAB WITHIN 5 DAYS.        LOWEST DETECTABLE LIMITS FOR URINE DRUG SCREEN Drug Class       Cutoff (ng/mL) Amphetamine      1000 Barbiturate      200 Benzodiazepine   532 Tricyclics       992 Opiates          300 Cocaine          300 THC              50   CBC with Differential     Status: None   Collection Time: 07/20/15 11:29 AM  Result Value Ref Range   WBC 6.8 4.0 - 10.5 K/uL   RBC 5.24 4.22 - 5.81 MIL/uL   Hemoglobin 16.4 13.0 - 17.0 g/dL   HCT 47.6 39.0 - 52.0 %   MCV 90.8 78.0 - 100.0 fL   MCH 31.3 26.0 - 34.0 pg   MCHC 34.5 30.0 - 36.0 g/dL   RDW 12.8 11.5 - 15.5 %   Platelets 218 150 - 400 K/uL   Neutrophils Relative % 62 43 - 77 %   Neutro Abs 4.2 1.7 - 7.7 K/uL   Lymphocytes Relative 34 12 - 46 %   Lymphs Abs 2.3 0.7 - 4.0 K/uL   Monocytes Relative 3 3 - 12 %   Monocytes Absolute 0.2 0.1 - 1.0 K/uL   Eosinophils Relative 1 0 - 5 %   Eosinophils Absolute 0.1 0.0 - 0.7 K/uL   Basophils Relative 0 0 - 1 %   Basophils Absolute 0.0 0.0 - 0.1 K/uL  Comprehensive metabolic panel     Status: Abnormal   Collection Time: 07/20/15 11:29 AM  Result Value Ref Range   Sodium 141 135 - 145 mmol/L   Potassium 4.3 3.5 - 5.1 mmol/L   Chloride 103 101 - 111 mmol/L   CO2 30 22 - 32 mmol/L   Glucose, Bld 101 (H) 65 - 99 mg/dL   BUN 7 6 - 20 mg/dL   Creatinine, Ser 1.30 (H) 0.61 - 1.24 mg/dL   Calcium 10.4 (H) 8.9 - 10.3 mg/dL   Total Protein 8.7 (H) 6.5 - 8.1 g/dL   Albumin 4.9 3.5 - 5.0 g/dL   AST 37 15 - 41 U/L   ALT 51 17 - 63 U/L   Alkaline Phosphatase 79 38 - 126 U/L   Total Bilirubin 0.6 0.3 - 1.2 mg/dL   GFR calc non Af Amer >60 >60 mL/min   GFR calc Af Amer >60 >60 mL/min    Comment: (NOTE) The eGFR has been calculated using the CKD EPI equation. This calculation has not been  validated in all clinical situations. eGFR's persistently <60 mL/min signify possible Chronic Kidney Disease.    Anion  gap 8 5 - 15  Lipase, blood     Status: None   Collection Time: 07/20/15 11:29 AM  Result Value Ref Range   Lipase 25 22 - 51 U/L  Acetaminophen level     Status: Abnormal   Collection Time: 07/20/15 11:29 AM  Result Value Ref Range   Acetaminophen (Tylenol), Serum <10 (L) 10 - 30 ug/mL    Comment:        THERAPEUTIC CONCENTRATIONS VARY SIGNIFICANTLY. A RANGE OF 10-30 ug/mL MAY BE AN EFFECTIVE CONCENTRATION FOR MANY PATIENTS. HOWEVER, SOME ARE BEST TREATED AT CONCENTRATIONS OUTSIDE THIS RANGE. ACETAMINOPHEN CONCENTRATIONS >150 ug/mL AT 4 HOURS AFTER INGESTION AND >50 ug/mL AT 12 HOURS AFTER INGESTION ARE OFTEN ASSOCIATED WITH TOXIC REACTIONS.   Salicylate level     Status: None   Collection Time: 07/20/15 11:29 AM  Result Value Ref Range   Salicylate Lvl <8.4 2.8 - 30.0 mg/dL    Vitals: Blood pressure 122/82, pulse 81, temperature 98 F (36.7 C), temperature source Oral, resp. rate 20, height 5' 11" (1.803 m), weight 98.431 kg (217 lb), SpO2 97 %.  Risk to Self: Suicidal Ideation: No Suicidal Intent: No Is patient at risk for suicide?: No (Patient overdosed on anxiety/sleep medications) Suicidal Plan?: No Access to Means: Yes Specify Access to Suicidal Means: Medications What has been your use of drugs/alcohol within the last 12 months?: None How many times?: 0 Other Self Harm Risks: None noted Triggers for Past Attempts: Other (Comment) (Financial) Intentional Self Injurious Behavior: None Risk to Others: Homicidal Ideation: No Thoughts of Harm to Others: No Current Homicidal Intent: No Current Homicidal Plan: No Access to Homicidal Means: No Identified Victim: NA History of harm to others?: No Assessment of Violence: None Noted Violent Behavior Description: None Does patient have access to weapons?: No Criminal Charges Pending?: No Does patient have a court date: No Prior Inpatient Therapy: Prior Inpatient Therapy: No Prior Therapy Dates: None Prior Therapy  Facilty/Provider(s): [facility] Reason for Treatment: Bipolar Prior Outpatient Therapy: Prior Outpatient Therapy: No Prior Therapy Dates: NA Prior Therapy Facilty/Provider(s): [facility] Reason for Treatment: Bipolar Does patient have an ACCT team?: No Does patient have Intensive In-House Services?  : No Does patient have [facility] services? : No Does patient have P4CC services?: No  Current Facility-Administered Medications  Medication Dose Route Frequency Provider Last Rate Last Dose  . gi cocktail (Maalox,Lidocaine,Donnatal)  30 mL Oral Once Michael Etienne, DO       Current Outpatient Prescriptions  Medication Sig Dispense Refill  . ALPRAZolam (XANAX) 0.5 MG tablet Take 0.5 mg by mouth daily as needed.    . Armodafinil (NUVIGIL) 250 MG tablet Take 250 mg by mouth daily.    Marland Kitchen atorvastatin (LIPITOR) 40 MG tablet Take 40 mg by mouth daily.    Marland Kitchen buPROPion (WELLBUTRIN XL) 150 MG 24 hr tablet Take 150 mg by mouth daily.    . Eszopiclone 3 MG TABS Take 3 mg by mouth at bedtime. Take immediately before bedtime    . lamoTRIgine (LAMICTAL) 200 MG tablet Take 200 mg by mouth daily.    Marland Kitchen omeprazole (PRILOSEC) 20 MG capsule Take 20 mg by mouth daily.  0  . SEROQUEL XR 400 MG 24 hr tablet Take 800 mg by mouth at bedtime as needed.  0  . valACYclovir (VALTREX) 500 MG tablet Take 500 mg by mouth. Takes for 5 days    .  ciprofloxacin (CILOXAN) 0.3 % ophthalmic solution Apply 1 drop to eye every 4 (four) hours. Place two drops in your left eye every 15 minutes for the first 6 hours followed by two drops in your left eye every 30 minutes for the rest of the first day. On day 2 place 2 drops in your left eye hourly. On days 3-14 place two drops every 4 hours. (Patient not taking: Reported on 07/20/2015) 2.5 mL 1  . HYDROcodone-acetaminophen (NORCO/VICODIN) 5-325 MG per tablet Take 1 tablet by mouth every 4 (four) hours as needed for pain. (Patient not taking: Reported on 07/20/2015) 10 tablet 0     Musculoskeletal: Strength & Muscle Tone: within normal limits Gait & Station: normal Patient leans: N/A  Psychiatric Specialty Exam: Physical Exam  Review of Systems  Constitutional: Negative.   HENT: Negative.   Eyes: Negative.   Respiratory: Negative.   Cardiovascular: Negative.   Gastrointestinal: Negative.   Genitourinary: Negative.   Musculoskeletal: Negative.   Skin: Negative.   Neurological: Negative.   Endo/Heme/Allergies: Negative.     Blood pressure 122/82, pulse 81, temperature 98 F (36.7 C), temperature source Oral, resp. rate 20, height 5' 11" (1.803 m), weight 98.431 kg (217 lb), SpO2 97 %.Body mass index is 30.28 kg/(m^2).  General Appearance: Casual and Fairly Groomed  Engineer, water::  Good  Speech:  Clear and Coherent and Normal Rate  Volume:  Normal  Mood:  Anxious  Affect:  Congruent  Thought Process:  Coherent, Goal Directed and Intact  Orientation:  Full (Time, Place, and Person)  Thought Content:  WDL  Suicidal Thoughts:  No  Homicidal Thoughts:  No  Memory:  Immediate;   Good Recent;   Good Remote;   Good  Judgement:  Intact  Insight:  Good  Psychomotor Activity:  Normal  Concentration:  Good  Recall:  NA  Fund of Knowledge:Good  Language: Good  Akathisia:  No  Handed:  Right  AIMS (if indicated):     Assets:  Desire for Improvement  ADL's:  Intact  Cognition: WNL  Sleep:      Medical Decision Making: Established Problem, Stable/Improving (1)  Disposition:  Discharge home, follow up with your [facility] provider as scheduled.  Delfin Gant   PMHNP-BC 07/21/2015 11:58 AM Patient seen face-to-face for psychiatric evaluation, chart reviewed and case discussed with the physician extender and developed treatment plan. Reviewed the information documented and agree with the treatment plan. Corena Pilgrim, MD

## 2015-07-21 NOTE — ED Notes (Signed)
On the phone 

## 2015-07-21 NOTE — ED Notes (Addendum)
Pt encouraged to follow up with his PMD and psychiatrist concerning his medications.  Pt verbalized understanding. Pt ambulatory to dc window w/ mHt, belongings returned after leaving the unit.  Significant other to pick pt up.

## 2016-07-28 ENCOUNTER — Emergency Department: Payer: Managed Care, Other (non HMO)

## 2016-07-28 ENCOUNTER — Emergency Department
Admission: EM | Admit: 2016-07-28 | Discharge: 2016-07-28 | Disposition: A | Discharge status: Home / Self Care | Payer: Managed Care, Other (non HMO) | Attending: Student | Admitting: Student

## 2016-07-28 DIAGNOSIS — R0789 Other chest pain: Secondary | ICD-10-CM | POA: Diagnosis not present

## 2016-07-28 DIAGNOSIS — I10 Essential (primary) hypertension: Secondary | ICD-10-CM | POA: Diagnosis not present

## 2016-07-28 DIAGNOSIS — R079 Chest pain, unspecified: Secondary | ICD-10-CM | POA: Diagnosis present

## 2016-07-28 LAB — CBC
HCT: 42.1 % (ref 40.0–52.0)
HEMOGLOBIN: 14.8 g/dL (ref 13.0–18.0)
MCH: 31.6 pg (ref 26.0–34.0)
MCHC: 35.2 g/dL (ref 32.0–36.0)
MCV: 89.6 fL (ref 80.0–100.0)
PLATELETS: 228 10*3/uL (ref 150–440)
RBC: 4.7 MIL/uL (ref 4.40–5.90)
RDW: 13.5 % (ref 11.5–14.5)
WBC: 7.4 10*3/uL (ref 3.8–10.6)

## 2016-07-28 LAB — BASIC METABOLIC PANEL
ANION GAP: 6 (ref 5–15)
BUN: 13 mg/dL (ref 6–20)
CHLORIDE: 106 mmol/L (ref 101–111)
CO2: 25 mmol/L (ref 22–32)
CREATININE: 1.39 mg/dL — AB (ref 0.61–1.24)
Calcium: 9.3 mg/dL (ref 8.9–10.3)
GFR calc non Af Amer: 59 mL/min — ABNORMAL LOW (ref >60)
Glucose, Bld: 106 mg/dL — ABNORMAL HIGH (ref 65–99)
POTASSIUM: 3.8 mmol/L (ref 3.5–5.1)
SODIUM: 137 mmol/L (ref 135–145)

## 2016-07-28 LAB — TROPONIN I: Troponin I: <0.03 ng/mL (ref <0.03)

## 2016-07-28 MED ORDER — OXYCODONE HCL 5 MG PO TABS: 5.0000 mg | ORAL_TABLET | Freq: Four times a day (QID) | ORAL | 0 refills | Status: DC | PRN

## 2016-07-28 MED ORDER — IBUPROFEN 600 MG PO TABS
600.0000 mg | ORAL_TABLET | Freq: Once | ORAL | Status: AC
  Administered 2016-07-28: 600 mg via ORAL
  Filled 2016-07-28: qty 1

## 2016-07-28 MED ORDER — IBUPROFEN 400 MG PO TABS: 400.0000 mg | ORAL_TABLET | Freq: Four times a day (QID) | ORAL | 0 refills | Status: AC | PRN

## 2016-07-28 MED ORDER — ACETAMINOPHEN 500 MG PO TABS
1000.0000 mg | ORAL_TABLET | Freq: Once | ORAL | Status: AC
  Administered 2016-07-28: 1000 mg via ORAL
  Filled 2016-07-28: qty 2

## 2016-07-28 NOTE — ED Notes (Signed)
Patient refused incentive spirometer. Dr. Inocencio HomesGayle aware.

## 2016-07-28 NOTE — ED Triage Notes (Signed)
Pt presents to the ED with intermittent sharp CP radiating to the R side of the back x 1 week. Pt reports he began experiencing pain 7 days ago that worsened today causing him to come into the ED. Pt describes the pain as "feels like sciatica in the chest". Denies diaphoresis or SOB. A&Ox4, skin warm, dry, and intact. Respirations even and unlabored.

## 2016-07-28 NOTE — ED Provider Notes (Signed)
[facility] Emergency Department Provider Note   ____________________________________________   First MD Initiated Contact with Patient 07/28/16 2014     (approximate)  I have reviewed the triage vital signs and the nursing notes.   HISTORY  Chief Complaint Chest Pain    HPI Michael Mclean is a 45 y.o. male with history of bipolar disorder and hypertension who presents for evaluation of one week of intermittent right chest pain which is brought on by movement, usually sudden onset, currently mild while he was laying still. Pain is worse with deep inspiration, pain wraps around to the right upper back. Pain is not associated with any shortness of breath, sudden sweating, nausea or vomiting. Pain is not worse with exertion. No family history of early coronary artery disease, no personal history of coronary artery disease. No personal history of DVT or PE. No hemostasis, exogenous estrogen use, recent surgeries, recent prolonged period of immobilization or leg swelling. No family history of sudden cardiac death.   Past Medical History:  Diagnosis Date  . Bipolar 1 disorder (HCC)   . Hypertension     Patient Active Problem List   Diagnosis Date Noted  . Mood disorder (HCC) 07/21/2015  . Anxiety disorder   . Bipolar affective disorder (HCC)     History reviewed. No pertinent surgical history.  Prior to Admission medications   Medication Sig Start Date End Date Taking? Authorizing Provider  ALPRAZolam Prudy Feeler) 0.5 MG tablet Take 0.5 mg by mouth daily as needed. 05/14/15   Historical Provider, MD  Armodafinil (NUVIGIL) 250 MG tablet Take 250 mg by mouth daily.    Historical Provider, MD  atorvastatin (LIPITOR) 40 MG tablet Take 40 mg by mouth daily.    Historical Provider, MD  buPROPion (WELLBUTRIN XL) 150 MG 24 hr tablet Take 150 mg by mouth daily.    Historical Provider, MD  Eszopiclone 3 MG TABS Take 3 mg by mouth at bedtime. Take immediately before  bedtime    Historical Provider, MD  ibuprofen (ADVIL,MOTRIN) 400 MG tablet Take 1 tablet (400 mg total) by mouth every 6 (six) hours as needed for moderate pain. 07/28/16   Gayla Doss, MD  lamoTRIgine (LAMICTAL) 200 MG tablet Take 200 mg by mouth daily.    Historical Provider, MD  omeprazole (PRILOSEC) 20 MG capsule Take 20 mg by mouth daily. 07/18/15   Historical Provider, MD  oxyCODONE (ROXICODONE) 5 MG immediate release tablet Take 1 tablet (5 mg total) by mouth every 6 (six) hours as needed for moderate pain. Do not drive while taking this medication. 07/28/16   Gayla Doss, MD  SEROQUEL XR 400 MG 24 hr tablet Take 800 mg by mouth at bedtime as needed. 07/19/15   Historical Provider, MD  valACYclovir (VALTREX) 500 MG tablet Take 500 mg by mouth. Takes for 5 days 05/25/15   Historical Provider, MD    Allergies Review of patient's allergies indicates no known allergies.  History reviewed. No pertinent family history.  Social History Social History  Substance Use Topics  . Smoking status: Never Smoker  . Smokeless tobacco: Never Used  . Alcohol use Yes    Review of Systems Constitutional: No fever/chills Eyes: No visual changes. ENT: No sore throat. Cardiovascular: +chest pain. Respiratory: Denies shortness of breath. Gastrointestinal: No abdominal pain.  No nausea, no vomiting.  No diarrhea.  No constipation. Genitourinary: Negative for dysuria. Musculoskeletal: Negative for back pain. Skin: Negative for rash. Neurological: Negative for headaches, focal weakness or numbness.  10-point ROS otherwise negative.  ____________________________________________   PHYSICAL EXAM:  Vitals:   07/28/16 1830 07/28/16 2029 07/28/16 2113  BP: 134/84 (!) 142/98 (!) 128/97  Pulse: 92 91 86  Resp: 16 16 16   Temp: 99.5 F (37.5 C)    TempSrc: Oral    SpO2: 100% 98% 98%    VITAL SIGNS: ED Triage Vitals  Enc Vitals Group     BP 07/28/16 1830 134/84     Pulse Rate 07/28/16 1830 92      Resp 07/28/16 1830 16     Temp 07/28/16 1830 99.5 F (37.5 C)     Temp Source 07/28/16 1830 Oral     SpO2 07/28/16 1830 100 %     Weight --      Height --      Head Circumference --      Peak Flow --      Pain Score 07/28/16 1826 7     Pain Loc --      Pain Edu? --      Excl. in GC? --     Constitutional: Alert and oriented. Well appearing and in no acute distress. Eyes: Conjunctivae are normal. PERRL. EOMI. Head: Atraumatic. Nose: No congestion/rhinnorhea. Mouth/Throat: Mucous membranes are moist.  Oropharynx non-erythematous. Neck: No stridor.  Supple without meningismus. Cardiovascular: Normal rate, regular rhythm. Grossly normal heart sounds.  Good peripheral circulation. Respiratory: Normal respiratory effort.  No retractions. Lungs CTAB. Gastrointestinal: Soft and nontender. No distention.  No CVA tenderness. Genitourinary: Deferred Musculoskeletal: No lower extremity tenderness nor edema.  No joint effusions. Mild tenderness to palpation throughout the right anterior chest wall, engagement of the right pectoralis muscles causes the patient to grimace and cry out in pain, this movement completely reduces his chest pain. Neurologic:  Normal speech and language. No gross focal neurologic deficits are appreciated. No gait instability. Skin:  Skin is warm, dry and intact. No rash noted. Psychiatric: Mood and affect are normal. Speech and behavior are normal.  ____________________________________________   LABS (all labs ordered are listed, but only abnormal results are displayed)  Labs Reviewed  BASIC METABOLIC PANEL - Abnormal; Notable for the following:       Result Value   Glucose, Bld 106 (*)    Creatinine, Ser 1.39 (*)    GFR calc non Af Amer 59 (*)    All other components within normal limits  CBC  TROPONIN I   ____________________________________________  EKG  ED ECG REPORT I, Gayla DossGayle, Ayala Ribble A, the attending physician, personally viewed and interpreted this  ECG.   Date: 07/28/2016  EKG Time: 18:21  Rate: 95  Rhythm: normal sinus rhythm  Axis: normal  Intervals:none  ST&T Change: No acute ST elevation or acute ST depression. Mild J-point elevation in V2 but this was seen previously on the EKG on 07/21/2015. EKG is unchanged from prior.  ____________________________________________  RADIOLOGY  CXR IMPRESSION:  1. Trace right pleural effusion with subsegmental atelectasis in the  right lower lobe.     ____________________________________________   PROCEDURES  Procedure(s) performed: None  Procedures  Critical Care performed: No  ____________________________________________   INITIAL IMPRESSION / ASSESSMENT AND PLAN / ED COURSE  Pertinent labs & imaging results that were available during my care of the patient were reviewed by me and considered in my medical decision making (see chart for details).  Michael Mclean is a 45 y.o. male with history of bipolar disorder and hypertension who presents for evaluation of one week of intermittent right  chest pain which is brought on by movement. On exam, he is very well-appearing and in no acute distress. His vital signs are stable and he is afebrile. He has reproducible right chest wall pain which I suspect is muscular skeletal in nature. His EKG is not consistent with acute ischemia and is unchanged from prior. I reviewed his labs. BMP shows mild creatinine elevation at 1.39 however this is chronic and appears to be the patient's baseline. CBC unremarkable. Troponin is negative. Not consistent with ACS, pain is not exertional, not associated with any shortness of breath. Heart score 3, low risk for ACS. He is PERC negative and I doubt PE. Pain is not ripping or tearing in nature, symmetric distal pulses in both arms and legs, not consistent with acute aortic dissection. Chest x-ray showed a right-sided atelectasis with a trace pleural effusion. I recommended he use an incentive spirometer to  help prevent pneumonia however he has refused this when we tried to provide him with one and teach him to use it. We will discharge with anti-inflammatory medications as well as very short course of oxycodone for treatment of breakthrough pain. We discussed meticulous return precautions and he is comfortable with the discharge plan. DC home.       ____________________________________________   FINAL CLINICAL IMPRESSION(S) / ED DIAGNOSES  Final diagnoses:  Chest wall pain      NEW MEDICATIONS STARTED DURING THIS VISIT:  Discharge Medication List as of 07/28/2016  9:38 PM    START taking these medications   Details  ibuprofen (ADVIL,MOTRIN) 400 MG tablet Take 1 tablet (400 mg total) by mouth every 6 (six) hours as needed for moderate pain., Starting Mon 07/28/2016, Print    oxyCODONE (ROXICODONE) 5 MG immediate release tablet Take 1 tablet (5 mg total) by mouth every 6 (six) hours as needed for moderate pain. Do not drive while taking this medication., Starting Mon 07/28/2016, Print         Note:  This document was prepared using Dragon voice recognition software and may include unintentional dictation errors.    Gayla Doss, MD 07/28/16 [phone]

## 2016-09-11 ENCOUNTER — Emergency Department: Payer: Managed Care, Other (non HMO)

## 2016-09-11 ENCOUNTER — Inpatient Hospital Stay
Admission: EM | Admit: 2016-09-11 | Discharge: 2016-09-16 | DRG: 541 | Disposition: A | Discharge status: Home Health | Payer: Managed Care, Other (non HMO) | Attending: Internal Medicine | Admitting: Internal Medicine

## 2016-09-11 DIAGNOSIS — M4644 Discitis, unspecified, thoracic region: Secondary | ICD-10-CM | POA: Diagnosis present

## 2016-09-11 DIAGNOSIS — F419 Anxiety disorder, unspecified: Secondary | ICD-10-CM | POA: Diagnosis present

## 2016-09-11 DIAGNOSIS — F319 Bipolar disorder, unspecified: Secondary | ICD-10-CM | POA: Diagnosis present

## 2016-09-11 DIAGNOSIS — M4624 Osteomyelitis of vertebra, thoracic region: Secondary | ICD-10-CM | POA: Diagnosis not present

## 2016-09-11 DIAGNOSIS — Z79899 Other long term (current) drug therapy: Secondary | ICD-10-CM

## 2016-09-11 DIAGNOSIS — M869 Osteomyelitis, unspecified: Secondary | ICD-10-CM | POA: Diagnosis present

## 2016-09-11 DIAGNOSIS — I10 Essential (primary) hypertension: Secondary | ICD-10-CM | POA: Diagnosis present

## 2016-09-11 DIAGNOSIS — Z452 Encounter for adjustment and management of vascular access device: Secondary | ICD-10-CM

## 2016-09-11 DIAGNOSIS — E785 Hyperlipidemia, unspecified: Secondary | ICD-10-CM | POA: Diagnosis present

## 2016-09-11 DIAGNOSIS — L0291 Cutaneous abscess, unspecified: Secondary | ICD-10-CM

## 2016-09-11 DIAGNOSIS — K5900 Constipation, unspecified: Secondary | ICD-10-CM | POA: Diagnosis present

## 2016-09-11 HISTORY — DX: Anxiety disorder, unspecified: F41.9

## 2016-09-11 HISTORY — DX: Hyperlipidemia, unspecified: E78.5

## 2016-09-11 LAB — URINALYSIS COMPLETE WITH MICROSCOPIC (ARMC ONLY)
BACTERIA UA: NONE SEEN
Bilirubin Urine: NEGATIVE
Glucose, UA: NEGATIVE mg/dL
HGB URINE DIPSTICK: NEGATIVE
Ketones, ur: NEGATIVE mg/dL
Leukocytes, UA: NEGATIVE
NITRITE: NEGATIVE
PROTEIN: NEGATIVE mg/dL
SPECIFIC GRAVITY, URINE: 1.03 (ref 1.005–1.030)
pH: 5 (ref 5.0–8.0)

## 2016-09-11 LAB — CBC
HCT: 42.4 % (ref 40.0–52.0)
HEMOGLOBIN: 14.1 g/dL (ref 13.0–18.0)
MCH: 30 pg (ref 26.0–34.0)
MCHC: 33.2 g/dL (ref 32.0–36.0)
MCV: 90.2 fL (ref 80.0–100.0)
Platelets: 323 10*3/uL (ref 150–440)
RBC: 4.7 MIL/uL (ref 4.40–5.90)
RDW: 13.3 % (ref 11.5–14.5)
WBC: 7.4 10*3/uL (ref 3.8–10.6)

## 2016-09-11 LAB — BASIC METABOLIC PANEL
ANION GAP: 7 (ref 5–15)
BUN: 11 mg/dL (ref 6–20)
CALCIUM: 9.3 mg/dL (ref 8.9–10.3)
CO2: 26 mmol/L (ref 22–32)
Chloride: 109 mmol/L (ref 101–111)
Creatinine, Ser: 1.33 mg/dL — ABNORMAL HIGH (ref 0.61–1.24)
GFR calc Af Amer: >60 mL/min (ref >60)
GFR calc non Af Amer: >60 mL/min (ref >60)
GLUCOSE: 87 mg/dL (ref 65–99)
Potassium: 3.7 mmol/L (ref 3.5–5.1)
Sodium: 142 mmol/L (ref 135–145)

## 2016-09-11 LAB — TROPONIN I

## 2016-09-11 LAB — SEDIMENTATION RATE: SED RATE: 36 mm/h — AB (ref 0–15)

## 2016-09-11 MED ORDER — IOPAMIDOL (ISOVUE-370) INJECTION 76%
75.0000 mL | Freq: Once | INTRAVENOUS | Status: AC | PRN
  Administered 2016-09-11: 75 mL via INTRAVENOUS
  Filled 2016-09-11: qty 75

## 2016-09-11 MED ORDER — GADOBENATE DIMEGLUMINE 529 MG/ML IV SOLN
20.0000 mL | Freq: Once | INTRAVENOUS | Status: AC | PRN
  Administered 2016-09-12: 19 mL via INTRAVENOUS

## 2016-09-11 NOTE — ED Provider Notes (Signed)
[facility] Emergency Department Provider Note  ____________________________________________   First MD Initiated Contact with Patient 09/11/16 2201     (approximate)  I have reviewed the triage vital signs and the nursing notes.   HISTORY  Chief Complaint Chest Pain    HPI Michael Mclean is a 45 y.o. male with a history of hypertension and bipolar disorder who presents for evaluation of gradually worsening right anterior chest wall pain that radiates towards his back.  It has been present for about one month.  Thing in particular makes it worse or better and it waxes and wanes and hits him suddenly at times with a very sharp and stabbing pain.  He states now that the pain radiates from a spot right in the middle of his spine all the way around the anterior rib cage.  He denies fever/chills, night sweats, any other chest pain, shortness of breath, nausea, vomiting, abdominal pain, dysuria.  He has no history of UTIs or prostatitis. He is not an IV drug abuser.  He has had 3 workups at urgent care and he last saw them earlier today.  They recommended that he come to the ED for possible CT scan for his anterior chest wall pain.  He has no numbness or nor tingling in any of his extremities and is able to ambulate without difficulty.   Past Medical History:  Diagnosis Date  . Bipolar 1 disorder ([place])   . Hypertension     Patient Active Problem List   Diagnosis Date Noted  . Mood disorder ([place]) 07/21/2015  . Anxiety disorder   . Bipolar affective disorder ([place])     No past surgical history on file.  Prior to Admission medications   Medication Sig Start Date End Date Taking? Authorizing Provider  ALPRAZolam Duanne Moron) 0.5 MG tablet Take 0.5 mg by mouth daily as needed. 05/14/15   Historical Provider, MD  Armodafinil (NUVIGIL) 250 MG tablet Take 250 mg by mouth daily.    Historical Provider, MD  atorvastatin (LIPITOR) 40 MG tablet Take 40 mg by mouth daily.     Historical Provider, MD  buPROPion (WELLBUTRIN XL) 150 MG 24 hr tablet Take 150 mg by mouth daily.    Historical Provider, MD  Eszopiclone 3 MG TABS Take 3 mg by mouth at bedtime. Take immediately before bedtime    Historical Provider, MD  ibuprofen (ADVIL,MOTRIN) 400 MG tablet Take 1 tablet (400 mg total) by mouth every 6 (six) hours as needed for moderate pain. 07/28/16   Joanne Gavel, MD  lamoTRIgine (LAMICTAL) 200 MG tablet Take 200 mg by mouth daily.    Historical Provider, MD  omeprazole (PRILOSEC) 20 MG capsule Take 20 mg by mouth daily. 07/18/15   Historical Provider, MD  oxyCODONE (ROXICODONE) 5 MG immediate release tablet Take 1 tablet (5 mg total) by mouth every 6 (six) hours as needed for moderate pain. Do not drive while taking this medication. 07/28/16   Joanne Gavel, MD  SEROQUEL XR 400 MG 24 hr tablet Take 800 mg by mouth at bedtime as needed. 07/19/15   Historical Provider, MD  valACYclovir (VALTREX) 500 MG tablet Take 500 mg by mouth. Takes for 5 days 05/25/15   Historical Provider, MD    Allergies Review of patient's allergies indicates no known allergies.  No family history on file.  Social History Social History  Substance Use Topics  . Smoking status: Never Smoker  . Smokeless tobacco: Never Used  . Alcohol use Yes  Review of Systems Constitutional: No fever/chills Eyes: No visual changes. ENT: No sore throat. Cardiovascular: Right anterior chest wall pain that radiates around to the middle of his back Respiratory: Denies shortness of breath. Gastrointestinal: No abdominal pain.  No nausea, no vomiting.  No diarrhea.  No constipation. Genitourinary: Negative for dysuria. Musculoskeletal: Pain that radiates around from the chest wall and is now continuous to a spot in the middle of his back Skin: Negative for rash. Neurological: Negative for headaches, focal weakness or numbness.  10-point ROS otherwise  negative.  ____________________________________________   PHYSICAL EXAM:  VITAL SIGNS: ED Triage Vitals  Enc Vitals Group     BP 09/11/16 2030 (!) 141/98     Pulse Rate 09/11/16 2030 93     Resp 09/11/16 2030 18     Temp 09/11/16 2030 97.9 F (36.6 C)     Temp Source 09/11/16 2030 Oral     SpO2 09/11/16 2030 100 %     Weight 09/11/16 2031 208 lb (94.3 kg)     Height 09/11/16 2031 '5\' 11"'$  (1.803 m)     Head Circumference --      Peak Flow --      Pain Score 09/11/16 2032 6     Pain Loc --      Pain Edu? --      Excl. in [place]? --     Constitutional: Alert and oriented. Well appearing and in no acute distress. Eyes: Conjunctivae are normal. PERRL. EOMI. Head: Atraumatic. Nose: No congestion/rhinnorhea. Mouth/Throat: Mucous membranes are moist.  Oropharynx non-erythematous. Neck: No stridor.  No meningeal signs.  No cervical spine tenderness to palpation. Cardiovascular: Normal rate, regular rhythm. Good peripheral circulation. Grossly normal heart sounds. Respiratory: Normal respiratory effort.  No retractions. Lungs CTAB. Gastrointestinal: Soft and nontender. No distention.  Musculoskeletal: Mild tenderness to palpation in the middle of his thoracic spine in the T4/D5/C6 region without any external evidence of swelling or erythema.  No reproducible chest wall tenderness to palpation. Neurologic:  Normal speech and language. No gross focal neurologic deficits are appreciated.  Skin:  Skin is warm, dry and intact. No rash noted. Psychiatric: Mood and affect are normal. Speech and behavior are normal.  ____________________________________________   LABS (all labs ordered are listed, but only abnormal results are displayed)  Labs Reviewed  BASIC METABOLIC PANEL - Abnormal; Notable for the following:       Result Value   Creatinine, Ser 1.33 (*)    All other components within normal limits  URINALYSIS COMPLETEWITH MICROSCOPIC ([facility] ONLY) - Abnormal; Notable for the  following:    Color, Urine YELLOW (*)    APPearance CLEAR (*)    Squamous Epithelial / LPF 0-5 (*)    All other components within normal limits  SEDIMENTATION RATE - Abnormal; Notable for the following:    Sed Rate 36 (*)    All other components within normal limits  CULTURE, BLOOD (ROUTINE X 2)  CULTURE, BLOOD (ROUTINE X 2)  CBC  TROPONIN I   ____________________________________________  EKG  ED ECG REPORT I, Reida Hem, the attending physician, personally viewed and interpreted this ECG.  Date: 09/11/2016 EKG Time: 20:36 Rate: 87 Rhythm: normal sinus rhythm with first-degree AV block QRS Axis: normal Intervals: First-degree AV block with PR interval of 212 ms ST/T Wave abnormalities: normal Conduction Disturbances: none Narrative Interpretation: unremarkable  ____________________________________________  RADIOLOGY   Dg Chest 2 View  Result Date: 09/11/2016 CLINICAL DATA:  Right lower rib cage pain EXAM:  CHEST  2 VIEW COMPARISON:  07/28/2016 FINDINGS: Mild linear atelectasis or scarring in the lower lobe on lateral view. No acute infiltrate or effusion. Cardiomediastinal silhouette normal. No pneumothorax. IMPRESSION: No radiographic evidence for acute cardiopulmonary abnormality. Electronically Signed   By: Donavan Foil M.D.   On: 09/11/2016 20:55   Ct Chest W Contrast  Result Date: 09/11/2016 CLINICAL DATA:  45 y/o M; severe pain in the right rib cage radiating around to the back. EXAM: CT CHEST WITH CONTRAST TECHNIQUE: Multidetector CT imaging of the chest was performed during intravenous contrast administration. CONTRAST:  75 cc Isovue 370 COMPARISON:  None. FINDINGS: Cardiovascular: No significant vascular findings. Normal heart size. No pericardial effusion. Mediastinum/Nodes: No enlarged mediastinal, hilar, or axillary lymph nodes. Thyroid gland, trachea, and esophagus demonstrate no significant findings. Lungs/Pleura: Lungs are clear. No pleural effusion or  pneumothorax. Upper Abdomen: Multiple fluid attenuating well-circumscribed foci within the liver measuring up to 13 mm in segment VII as well as several additional smaller foci that are too small to characterize probably representing cysts. Musculoskeletal: Destructive lesion centered within T6-7 disc and endplates (series 6, image 99) and paravertebral surrounding soft tissue thickening from T5-T7 (2:63 and 5:103). IMPRESSION: Destructive lesion centered within the T6-7 disc and endplates with paravertebral soft tissue thickening. In the absence of primary malignancy this probably represents discitis osteomyelitis. Thoracic MRI with contrast is recommended to evaluate for epidural disease. These results were called by telephone at the time of interpretation on 09/11/2016 at 10:50 pm to Dr. Hinda Kehr , who verbally acknowledged these results. Electronically Signed   By: Kristine Garbe M.D.   On: 09/11/2016 22:55    ____________________________________________   PROCEDURES  Procedure(s) performed:   Procedures   Critical Care performed: No ____________________________________________   INITIAL IMPRESSION / ASSESSMENT AND PLAN / ED COURSE  Pertinent labs & imaging results that were available during my care of the patient were reviewed by me and considered in my medical decision making (see chart for details).  The patient's workup thus far has been reassuring.  Differential diagnosis includes a number of rare diagnoses such as extrapulmonary TB, sarcoidosis, vertebral osteomyelitis (possible hematogenous spread from prostatitis).  Other diagnoses such as zoster is very unlikely given the fact that he has not developed a rash over the last month.  The patient has no abdominal/GI symptoms making a liver abnormalities very unlikely.  Common things being common he most likely is having musculoskeletal pain although it is unclear why he would be having this since he is, as he describes,  sedentary and desk bound.  I added on an ESR and a urinalysis and I am going to evaluate his chest as thoroughly as possible with a CT chest IV contrast.  He has no neurological deficits and I think that diagnoses such as vertebral osteomyelitis is very unlikely although it would require MR imaging for thorough evaluation.  However the fact the symptoms started in his right anterior chest wall and radiating to his back in my mind make prostatitis less likely.  I will reassessed after the rest of the labs and the imaging.  I explained to the patient that we will likely not identify the cause of his pain tonight and that he may need to follow up either with his primary care doctor or with the pain clinic.   Clinical Course  Comment By Time  The radiologist called and spoke with me about the results.  The patient appears to have osteomyelitis versus discitis with  some edema and destructive lesions of the thoracic spine.  He recommended MR thoracic spine with contrast for further evaluation and to make sure there is no evidence of epidural abscess.  The pain radiating throughout the patient's chest is likely radiculopathy from the vertebral lesions.  I have ordered MR thoracic spine and MRI lumbar spine given that I feel it is important to make sure he does not have any other hematogenous spread from the original source.  His urinalysis is unremarkable making prostatitis unlikely.  His sedimentation rate is slightly elevated and he does not have a leukocytosis.  I will order blood cultures but will hold off on antibiotics as the patient is not currently septic and he may benefit from biopsy to specifically identify the organism at fault.  I will transfer ED care to the oncoming night time physician and discuss the case in detail.  I updated the patient and he understands the current plan and he has no history of claustrophobia. 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    Hinda Kehr, MD 09/11/16 [phone]

## 2016-09-11 NOTE — ED Triage Notes (Addendum)
Pt reports right lower ribcage pain radiating around to back with no accomp symptoms; seen for same month ago and dx with CWP; seen at urgent care x 3 and PCP but not feeling any better; seen at urgent care tonight as well and received a toradol injection and told to come to ED for a CT scan

## 2016-09-11 NOTE — ED Notes (Signed)
Patient transported to CT 

## 2016-09-12 ENCOUNTER — Inpatient Hospital Stay
Admit: 2016-09-12 | Discharge: 2016-09-12 | Disposition: A | Discharge status: Home / Self Care | Payer: Managed Care, Other (non HMO) | Attending: Infectious Diseases | Admitting: Infectious Diseases

## 2016-09-12 ENCOUNTER — Encounter: Payer: Self-pay | Admitting: Internal Medicine

## 2016-09-12 DIAGNOSIS — M4644 Discitis, unspecified, thoracic region: Secondary | ICD-10-CM | POA: Diagnosis present

## 2016-09-12 DIAGNOSIS — E785 Hyperlipidemia, unspecified: Secondary | ICD-10-CM | POA: Diagnosis present

## 2016-09-12 DIAGNOSIS — F319 Bipolar disorder, unspecified: Secondary | ICD-10-CM | POA: Diagnosis present

## 2016-09-12 DIAGNOSIS — F419 Anxiety disorder, unspecified: Secondary | ICD-10-CM | POA: Diagnosis present

## 2016-09-12 DIAGNOSIS — M4624 Osteomyelitis of vertebra, thoracic region: Secondary | ICD-10-CM | POA: Diagnosis present

## 2016-09-12 DIAGNOSIS — M869 Osteomyelitis, unspecified: Secondary | ICD-10-CM | POA: Diagnosis present

## 2016-09-12 DIAGNOSIS — I10 Essential (primary) hypertension: Secondary | ICD-10-CM | POA: Diagnosis present

## 2016-09-12 DIAGNOSIS — Z79899 Other long term (current) drug therapy: Secondary | ICD-10-CM | POA: Diagnosis not present

## 2016-09-12 DIAGNOSIS — K5900 Constipation, unspecified: Secondary | ICD-10-CM | POA: Diagnosis present

## 2016-09-12 LAB — RAPID HIV SCREEN (HIV 1/2 AB+AG)
HIV 1/2 ANTIBODIES: NONREACTIVE
HIV-1 P24 Antigen - HIV24: NONREACTIVE

## 2016-09-12 LAB — SEDIMENTATION RATE: Sed Rate: 41 mm/hr — ABNORMAL HIGH (ref 0–15)

## 2016-09-12 LAB — C-REACTIVE PROTEIN: CRP: <0.8 mg/dL (ref <1.0)

## 2016-09-12 MED ORDER — ALPRAZOLAM 0.5 MG PO TABS
0.5000 mg | ORAL_TABLET | Freq: Two times a day (BID) | ORAL | Status: DC | PRN
  Administered 2016-09-14: 0.5 mg via ORAL
  Filled 2016-09-12 (×2): qty 1

## 2016-09-12 MED ORDER — CEFTRIAXONE SODIUM-DEXTROSE 1-3.74 GM-% IV SOLR: 1.0000 g | Freq: Once | INTRAVENOUS | Status: DC

## 2016-09-12 MED ORDER — CEFTRIAXONE SODIUM-DEXTROSE 2-2.22 GM-% IV SOLR
2.0000 g | INTRAVENOUS | Status: DC
  Filled 2016-09-12: qty 50

## 2016-09-12 MED ORDER — ARMODAFINIL 250 MG PO TABS: 250.0000 mg | ORAL_TABLET | Freq: Every day | ORAL | Status: DC

## 2016-09-12 MED ORDER — VANCOMYCIN HCL IN DEXTROSE 1-5 GM/200ML-% IV SOLN
1000.0000 mg | Freq: Once | INTRAVENOUS | Status: AC
  Administered 2016-09-12: 1000 mg via INTRAVENOUS
  Filled 2016-09-12: qty 200

## 2016-09-12 MED ORDER — KETOROLAC TROMETHAMINE 30 MG/ML IJ SOLN
15.0000 mg | Freq: Four times a day (QID) | INTRAMUSCULAR | Status: DC | PRN
  Administered 2016-09-12 – 2016-09-16 (×12): 15 mg via INTRAVENOUS
  Filled 2016-09-12 (×11): qty 1

## 2016-09-12 MED ORDER — ACETAMINOPHEN 650 MG RE SUPP: 650.0000 mg | Freq: Four times a day (QID) | RECTAL | Status: DC | PRN

## 2016-09-12 MED ORDER — MORPHINE SULFATE (PF) 2 MG/ML IV SOLN
4.0000 mg | Freq: Once | INTRAVENOUS | Status: AC
  Administered 2016-09-12: 4 mg via INTRAVENOUS
  Filled 2016-09-12: qty 2

## 2016-09-12 MED ORDER — LAMOTRIGINE 100 MG PO TABS
200.0000 mg | ORAL_TABLET | Freq: Every day | ORAL | Status: DC
  Administered 2016-09-12 – 2016-09-16 (×5): 200 mg via ORAL
  Filled 2016-09-12 (×5): qty 2

## 2016-09-12 MED ORDER — ACETAMINOPHEN 325 MG PO TABS: 650.0000 mg | ORAL_TABLET | Freq: Four times a day (QID) | ORAL | Status: DC | PRN

## 2016-09-12 MED ORDER — SODIUM CHLORIDE 0.9 % IV SOLN
INTRAVENOUS | Status: DC
  Administered 2016-09-12 (×2): via INTRAVENOUS

## 2016-09-12 MED ORDER — TOPIRAMATE 25 MG PO TABS
100.0000 mg | ORAL_TABLET | ORAL | Status: DC
  Administered 2016-09-12 – 2016-09-15 (×4): 100 mg via ORAL
  Filled 2016-09-12 (×4): qty 4

## 2016-09-12 MED ORDER — ATORVASTATIN CALCIUM 20 MG PO TABS
40.0000 mg | ORAL_TABLET | Freq: Every day | ORAL | Status: DC
  Administered 2016-09-12 – 2016-09-16 (×5): 40 mg via ORAL
  Filled 2016-09-12 (×6): qty 2

## 2016-09-12 MED ORDER — CEFTRIAXONE SODIUM-DEXTROSE 2-2.22 GM-% IV SOLR
2.0000 g | Freq: Once | INTRAVENOUS | Status: DC
  Filled 2016-09-12: qty 50

## 2016-09-12 MED ORDER — ZOLPIDEM TARTRATE 5 MG PO TABS
5.0000 mg | ORAL_TABLET | Freq: Every evening | ORAL | Status: DC | PRN
  Administered 2016-09-12 – 2016-09-14 (×2): 5 mg via ORAL
  Filled 2016-09-12 (×2): qty 1

## 2016-09-12 MED ORDER — DOCUSATE SODIUM 100 MG PO CAPS
100.0000 mg | ORAL_CAPSULE | Freq: Two times a day (BID) | ORAL | Status: DC
  Administered 2016-09-12 – 2016-09-16 (×9): 100 mg via ORAL
  Filled 2016-09-12 (×9): qty 1

## 2016-09-12 MED ORDER — DEXTROSE 5 % IV SOLN: 1.0000 g | Freq: Once | INTRAVENOUS | Status: DC

## 2016-09-12 MED ORDER — ONDANSETRON HCL 4 MG/2ML IJ SOLN
4.0000 mg | Freq: Once | INTRAMUSCULAR | Status: AC
  Administered 2016-09-12: 4 mg via INTRAVENOUS
  Filled 2016-09-12: qty 2

## 2016-09-12 MED ORDER — KETOROLAC TROMETHAMINE 30 MG/ML IJ SOLN
INTRAMUSCULAR | Status: AC
  Administered 2016-09-12: 15 mg via INTRAVENOUS
  Filled 2016-09-12: qty 1

## 2016-09-12 MED ORDER — QUETIAPINE FUMARATE ER 200 MG PO TB24
400.0000 mg | ORAL_TABLET | Freq: Every day | ORAL | Status: DC
Start: 2016-09-12 — End: 2016-09-16
  Administered 2016-09-12 – 2016-09-16 (×5): 400 mg via ORAL
  Filled 2016-09-12 (×6): qty 2

## 2016-09-12 MED ORDER — OXYCODONE HCL 5 MG PO TABS
5.0000 mg | ORAL_TABLET | Freq: Four times a day (QID) | ORAL | Status: DC | PRN
  Administered 2016-09-12 – 2016-09-16 (×12): 5 mg via ORAL
  Filled 2016-09-12 (×13): qty 1

## 2016-09-12 MED ORDER — VANCOMYCIN HCL 10 G IV SOLR
1250.0000 mg | Freq: Two times a day (BID) | INTRAVENOUS | Status: DC
  Administered 2016-09-12: 1250 mg via INTRAVENOUS
  Filled 2016-09-12 (×2): qty 1250

## 2016-09-12 MED ORDER — BUPROPION HCL ER (XL) 150 MG PO TB24
150.0000 mg | ORAL_TABLET | Freq: Every day | ORAL | Status: DC
  Administered 2016-09-12 – 2016-09-16 (×5): 150 mg via ORAL
  Filled 2016-09-12 (×5): qty 1

## 2016-09-12 MED ORDER — ENOXAPARIN SODIUM 40 MG/0.4ML ~~LOC~~ SOLN
40.0000 mg | SUBCUTANEOUS | Status: DC
  Administered 2016-09-12: 40 mg via SUBCUTANEOUS
  Filled 2016-09-12: qty 0.4

## 2016-09-12 NOTE — Progress Notes (Signed)
*  PRELIMINARY RESULTS* Echocardiogram 2D Echocardiogram has been performed.  Cristela BlueHege, Jazzma Neidhardt 09/12/2016, 2:52 PM

## 2016-09-12 NOTE — ED Provider Notes (Signed)
-----------------------------------------   2:49 AM on 09/12/2016 -----------------------------------------  Patient MRI consistent with osteomyelitis and discitis. Some epidural phlegmon but no epidural abscess. D[facility] with [facility] Conchita ParisNundkumar [facility]. No current indication for surgical intervention. Recommended starting IV abx. If blood cultures do not grow out any bacteria would then recommend IR biopsy. Will plan on starting IV abx and admitting to hospitalist service.    Phineas SemenGraydon Rinda Rollyson, MD 09/12/16 [phone]

## 2016-09-12 NOTE — Progress Notes (Signed)
Pharmacy Antibiotic Note  Michael Mclean Mclean is a 45 y.o. male admitted on 09/11/2016 with osteomyelitis.  Pharmacy has been consulted for ceftriaxone and vancomycin dosing.  Plan: 1. Ceftriaxone 2 gm IV Q24H 2. Vancomycin 1 gm IV x 1 in ED followed in 6 hours (stacked dosing) by vancomycin 1.25 gm IV Q12H, predicted trough 16 mcg/mL. Pharmacy will continue to follow and adjust as needed to maintain trough 15 to 20 mcg/mL.  Vd 58 L, Ke 0.072 hr-1, T1/2 9.6 hr  Height: 5\' 11"  (180.3 cm) Weight: 208 lb (94.3 kg) IBW/kg (Calculated) : 75.3  Temp (24hrs), Avg:97.9 F (36.6 C), Min:97.9 F (36.6 C), Max:97.9 F (36.6 C)   Recent Labs Lab 09/11/16 2038  WBC 7.4  CREATININE 1.33*    Estimated Creatinine Clearance: 81.4 mL/min (by C-G formula based on SCr of 1.33 mg/dL (H)).    No Known Allergies   Thank you for allowing pharmacy to be a part of this patient's care.  Michael Mclean, Pharm.D., BCPS Clinical Pharmacist 09/12/2016 3:52 AM

## 2016-09-12 NOTE — Consult Note (Signed)
[facility]     Reason for Consult: T spine osteomyelitis    Referring Physician: Boykin Reaper Date of Admission:  09/11/2016   Principal Problem:   Osteomyelitis [facility])   HPI: Michael Mclean is a 45 y.o. male relatively healthy admitted with a month - 2 month of increasing rib cage and back pain. On admit found on imaging to have evidence of osteo/discitis.  He reports the only illness was in August when he stopped his bipolar meds and develop da brief episode of violent nausea and vomiting and diarrhea. Did not seek care and his sxs resolved. He had a small cut on his hand in September as well but this did not get infected. No recent dental work or procedures. No ssti, no uti sxs, no PNA or upper resp infection.  No sick contacts. No travel, no animals. Lives with his GF, works from home 4/5 days for [employer]. No hx TB or TB contacts. Denies fevers, chills, ns. Not sure about wt loss.   Past Medical History:  Diagnosis Date  . Anxiety disorder   . Bipolar 1 disorder ([place])   . Hyperlipidemia   . Hypertension    Past Surgical History:  Procedure Laterality Date  . none     Social History  Substance Use Topics  . Smoking status: Never Smoker  . Smokeless tobacco: Never Used  . Alcohol use Yes   History reviewed. No pertinent family history.  Allergies: No Known Allergies  Current antibiotics: Antibiotics Given (last 72 hours)    Date/Time Action Medication Dose Rate   09/12/16 0935 Given   vancomycin (VANCOCIN) 1,250 mg in sodium chloride 0.9 % 250 mL IVPB 1,250 mg 166.7 mL/hr      MEDICATIONS: . atorvastatin  40 mg Oral Daily  . buPROPion  150 mg Oral Daily  . docusate sodium  100 mg Oral BID  . lamoTRIgine  200 mg Oral Daily  . QUEtiapine  400 mg Oral Daily  . topiramate  100 mg Oral [id number]    Review of Systems - 11 systems reviewed and negative per HPI   OBJECTIVE: Temp:  [97.9 F (36.6 C)-98.2 F (36.8 C)] 98.2 F (36.8 C) (10/27  1201) Pulse Rate:  [78-93] 82 (10/27 1201) Resp:  [15-25] 18 (10/27 0509) BP: (122-147)/(75-103) 125/93 (10/27 1201) SpO2:  [98 %-100 %] 100 % (10/27 1201) Weight:  [94.3 kg (208 lb)] 94.3 kg (208 lb) (10/26 2332) Physical Exam  Constitutional: He is oriented to person, place, and time. He appears well-developed and well-nourished. No distress.  HENT: anicteric Mouth/Throat: Oropharynx is clear and moist. No oropharyngeal exudate.  Cardiovascular: Normal rate, regular rhythm and normal heart sounds. Exam reveals no gallop and no friction rub.  Pulmonary/Chest: Effort normal and breath sounds normal. No respiratory distress. He has no wheezes.  Abdominal: Soft. Bowel sounds are normal. He exhibits no distension. There is no tenderness.  Back TTP over t spine, no deformity Lymphadenopathy: He has no cervical adenopathy.  Neurological: He is alert and oriented to person, place, and time.  Skin: Skin is warm and dry. No rash noted. No erythema. No stigmata endocarditits Psychiatric: He has a normal mood and affect. His behavior is normal.   LABS: Results for orders placed or performed during the hospital encounter of 09/11/16 (from the past 48 hour(s))  Basic metabolic panel     Status: Abnormal   Collection Time: 09/11/16  8:38 PM  Result Value Ref Range   Sodium 142 135 -  145 mmol/L   Potassium 3.7 3.5 - 5.1 mmol/L   Chloride 109 101 - 111 mmol/L   CO2 26 22 - 32 mmol/L   Glucose, Bld 87 65 - 99 mg/dL   BUN 11 6 - 20 mg/dL   Creatinine, Ser 1.33 (H) 0.61 - 1.24 mg/dL   Calcium 9.3 8.9 - 10.3 mg/dL   GFR calc non Af Amer >60 >60 mL/min   GFR calc Af Amer >60 >60 mL/min    Comment: (NOTE) The eGFR has been calculated using the CKD EPI equation. This calculation has not been validated in all clinical situations. eGFR's persistently <60 mL/min signify possible Chronic Kidney Disease.    Anion gap 7 5 - 15  CBC     Status: None   Collection Time: 09/11/16  8:38 PM  Result Value  Ref Range   WBC 7.4 3.8 - 10.6 K/uL   RBC 4.70 4.40 - 5.90 MIL/uL   Hemoglobin 14.1 13.0 - 18.0 g/dL   HCT 42.4 40.0 - 52.0 %   MCV 90.2 80.0 - 100.0 fL   MCH 30.0 26.0 - 34.0 pg   MCHC 33.2 32.0 - 36.0 g/dL   RDW 13.3 11.5 - 14.5 %   Platelets 323 150 - 440 K/uL  Troponin I     Status: None   Collection Time: 09/11/16  8:38 PM  Result Value Ref Range   Troponin I <0.03 <0.03 ng/mL  Sedimentation rate     Status: Abnormal   Collection Time: 09/11/16  8:38 PM  Result Value Ref Range   Sed Rate 36 (H) 0 - 15 mm/hr  Urinalysis complete, with microscopic ([facility] only)     Status: Abnormal   Collection Time: 09/11/16 10:30 PM  Result Value Ref Range   Color, Urine YELLOW (A) YELLOW   APPearance CLEAR (A) CLEAR   Glucose, UA NEGATIVE NEGATIVE mg/dL   Bilirubin Urine NEGATIVE NEGATIVE   Ketones, ur NEGATIVE NEGATIVE mg/dL   Specific Gravity, Urine 1.030 1.005 - 1.030   Hgb urine dipstick NEGATIVE NEGATIVE   pH 5.0 5.0 - 8.0   Protein, ur NEGATIVE NEGATIVE mg/dL   Nitrite NEGATIVE NEGATIVE   Leukocytes, UA NEGATIVE NEGATIVE   RBC / HPF 0-5 0 - 5 RBC/hpf   WBC, UA 0-5 0 - 5 WBC/hpf   Bacteria, UA NONE SEEN NONE SEEN   Squamous Epithelial / LPF 0-5 (A) NONE SEEN   Mucous PRESENT   Blood culture (routine x 2)     Status: None (Preliminary result)   Collection Time: 09/11/16 11:18 PM  Result Value Ref Range   Specimen Description BLOOD  RIGHT AC    Special Requests      BOTTLES DRAWN AEROBIC AND ANAEROBIC  ANA 10ML AER 7ML   Culture NO GROWTH < 12 HOURS    Report Status PENDING   Blood culture (routine x 2)     Status: None (Preliminary result)   Collection Time: 09/11/16 11:18 PM  Result Value Ref Range   Specimen Description BLOOD  LEFT HAND    Special Requests      BOTTLES DRAWN AEROBIC AND ANAEROBIC  ANA 4ML AER 4ML   Culture NO GROWTH < 12 HOURS    Report Status PENDING   Sedimentation rate     Status: Abnormal   Collection Time: 09/12/16 12:08 PM  Result Value Ref Range    Sed Rate 41 (H) 0 - 15 mm/hr  Rapid HIV screen (HIV 1/2 Ab+Ag)  Status: None   Collection Time: 09/12/16 12:08 PM  Result Value Ref Range   HIV-1 P24 Antigen - HIV24 NON REACTIVE NON REACTIVE   HIV 1/2 Antibodies NON REACTIVE NON REACTIVE   Interpretation (HIV Ag Ab)      A non reactive test result means that HIV 1 or HIV 2 antibodies and HIV 1 p24 antigen were not detected in the specimen.  C-reactive protein     Status: None   Collection Time: 09/12/16 12:09 PM  Result Value Ref Range   CRP <0.8 <1.0 mg/dL    Comment: Performed at [facility]   No components found for: ESR, C REACTIVE PROTEIN MICRO: Recent Results (from the past 720 hour(s))  Blood culture (routine x 2)     Status: None (Preliminary result)   Collection Time: 09/11/16 11:18 PM  Result Value Ref Range Status   Specimen Description BLOOD  RIGHT AC  Final   Special Requests   Final    BOTTLES DRAWN AEROBIC AND ANAEROBIC  ANA 10ML AER 7ML   Culture NO GROWTH < 12 HOURS  Final   Report Status PENDING  Incomplete  Blood culture (routine x 2)     Status: None (Preliminary result)   Collection Time: 09/11/16 11:18 PM  Result Value Ref Range Status   Specimen Description BLOOD  LEFT HAND  Final   Special Requests   Final    BOTTLES DRAWN AEROBIC AND ANAEROBIC  ANA 4ML AER 4ML   Culture NO GROWTH < 12 HOURS  Final   Report Status PENDING  Incomplete    IMAGING: Dg Chest 2 View  Result Date: 09/11/2016 CLINICAL DATA:  Right lower rib cage pain EXAM: CHEST  2 VIEW COMPARISON:  07/28/2016 FINDINGS: Mild linear atelectasis or scarring in the lower lobe on lateral view. No acute infiltrate or effusion. Cardiomediastinal silhouette normal. No pneumothorax. IMPRESSION: No radiographic evidence for acute cardiopulmonary abnormality. Electronically Signed   By: Donavan Foil M.D.   On: 09/11/2016 20:55   Ct Chest W Contrast  Result Date: 09/11/2016 CLINICAL DATA:  45 y/o M; severe pain in the right rib cage  radiating around to the back. EXAM: CT CHEST WITH CONTRAST TECHNIQUE: Multidetector CT imaging of the chest was performed during intravenous contrast administration. CONTRAST:  75 cc Isovue 370 COMPARISON:  None. FINDINGS: Cardiovascular: No significant vascular findings. Normal heart size. No pericardial effusion. Mediastinum/Nodes: No enlarged mediastinal, hilar, or axillary lymph nodes. Thyroid gland, trachea, and esophagus demonstrate no significant findings. Lungs/Pleura: Lungs are clear. No pleural effusion or pneumothorax. Upper Abdomen: Multiple fluid attenuating well-circumscribed foci within the liver measuring up to 13 mm in segment VII as well as several additional smaller foci that are too small to characterize probably representing cysts. Musculoskeletal: Destructive lesion centered within T6-7 disc and endplates (series 6, image 99) and paravertebral surrounding soft tissue thickening from T5-T7 (2:63 and 5:103). IMPRESSION: Destructive lesion centered within the T6-7 disc and endplates with paravertebral soft tissue thickening. In the absence of primary malignancy this probably represents discitis osteomyelitis. Thoracic MRI with contrast is recommended to evaluate for epidural disease. These results were called by telephone at the time of interpretation on 09/11/2016 at 10:50 pm to Dr. Hinda Kehr , who verbally acknowledged these results. Electronically Signed   By: Kristine Garbe M.D.   On: 09/11/2016 22:55   Mr Thoracic Spine W Wo Contrast  Result Date: 09/12/2016 CLINICAL DATA:  Initial evaluation for acute pain at right lower rib cage, radiating  around to back. EXAM: MRI THORACIC SPINE AND LUMBAR SPINE WITHOUT AND WITH CONTRAST TECHNIQUE: Multiplanar and multiecho pulse sequences of the thoracic spine were obtained without and with intravenous contrast. CONTRAST:  49m MULTIHANCE GADOBENATE DIMEGLUMINE 529 MG/ML IV SOLN COMPARISON:  Prior CTs from 09/11/2016. FINDINGS: MRI  THORACIC SPINE FINDINGS Alignment: Vertebral bodies normally aligned with preservation of the normal thoracic kyphosis. No listhesis or malalignment. Vertebrae: There is abnormal T1 hypo intense, T2/STIR hyperintense signal intensity with postcontrast enhancement involving the T6 and T7 vertebral bodies, most consistent with osteomyelitis discitis. Abnormal edema and enhancement within the T6-7 disc space. There is disc space height loss at T6-7 with scattered endplate irregularity. No associated pathologic fracture. Abnormal epidural enhancement within the ventral epidural space at T5-6 through T7-8 without frank abscess. Abnormal edema with enhancing phlegmonous change within the adjacent paraspinous soft tissues. No discrete soft tissue abscess or drainable fluid collection identified. Remainder of the vertebral bodies are normal in appearance with normal signal intensity. Cord: Signal intensity within the thoracic spinal cord is normal. Mild canal stenosis at the level of T6-7 related to the adjacent inflammatory process. No other significant canal narrowing. The Disc levels: Note made of a few small degenerative disc bulge nodes within the thoracic spine, most notable at the T9 level. No significant stenosis. MRI LUMBAR SPINE FINDINGS Segmentation: Normal segmentation. Lowest well-formed disc is labeled the L5-S1 level. Alignment: Vertebral bodies normally aligned with preservation of the normal lumbar lordosis. No listhesis. Vertebrae: Vertebral body heights maintained. No evidence for acute or chronic fracture. Signal intensity within the vertebral body bone marrow is normal. No findings to suggest acute infection within the lumbar spine. Conus medullaris: Extends to the L1-2 level and appears normal. Paraspinal and other soft tissues: Paraspinous soft tissues demonstrate no acute abnormality. Disc levels: L1-2:  Negative. L2-3:  Negative. L3-4: No disc bulge or disc protrusion. Mild bilateral facet  arthrosis. No significant stenosis. L4-5: Broad posterior central disc protrusion with slight caudad angulation. Associated annular fissure. Resultant mild subarticular stenosis without frank neural impingement. Mild bilateral foraminal narrowing, slightly worse on the left. L5-S1:  Negative. IMPRESSION: MRI THORACIC SPINE IMPRESSION: 1. 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 2. Associated epidural enhancement/phlegmon extending from T5-6 through T7 without discrete epidural abscess. Associated mild canal stenosis. 3. Associated paraspinous phlegmon at the T6-7 level. No discrete abscess or drainable fluid collection identified. MRI LUMBAR SPINE IMPRESSION: 1. No MRI evidence for acute infection within the lumbar spine. 2. Central disc protrusion at L4-5 without significant stenosis or evidence for neural impingement. 3. Mild facet arthropathy at L3-4. Electronically Signed   By: BJeannine BogaM.D.   On: 09/12/2016 02:11   Mr Lumbar Spine W Wo Contrast  Result Date: 09/12/2016 CLINICAL DATA:  Initial evaluation for acute pain at right lower rib cage, radiating around to back. EXAM: MRI THORACIC SPINE AND LUMBAR SPINE WITHOUT AND WITH CONTRAST TECHNIQUE: Multiplanar and multiecho pulse sequences of the thoracic spine were obtained without and with intravenous contrast. CONTRAST:  13mMULTIHANCE GADOBENATE DIMEGLUMINE 529 MG/ML IV SOLN COMPARISON:  Prior CTs from 09/11/2016. FINDINGS: MRI THORACIC SPINE FINDINGS Alignment: Vertebral bodies normally aligned with preservation of the normal thoracic kyphosis. No listhesis or malalignment. Vertebrae: There is abnormal T1 hypo intense, T2/STIR hyperintense signal intensity with postcontrast enhancement involving the T6 and T7 vertebral bodies, most consistent with osteomyelitis discitis. Abnormal edema and enhancement within the T6-7 disc space. There is disc space height loss at T6-7 with  scattered endplate irregularity.  No associated pathologic fracture. Abnormal epidural enhancement within the ventral epidural space at T5-6 through T7-8 without frank abscess. Abnormal edema with enhancing phlegmonous change within the adjacent paraspinous soft tissues. No discrete soft tissue abscess or drainable fluid collection identified. Remainder of the vertebral bodies are normal in appearance with normal signal intensity. Cord: Signal intensity within the thoracic spinal cord is normal. Mild canal stenosis at the level of T6-7 related to the adjacent inflammatory process. No other significant canal narrowing. The Disc levels: Note made of a few small degenerative disc bulge nodes within the thoracic spine, most notable at the T9 level. No significant stenosis. MRI LUMBAR SPINE FINDINGS Segmentation: Normal segmentation. Lowest well-formed disc is labeled the L5-S1 level. Alignment: Vertebral bodies normally aligned with preservation of the normal lumbar lordosis. No listhesis. Vertebrae: Vertebral body heights maintained. No evidence for acute or chronic fracture. Signal intensity within the vertebral body bone marrow is normal. No findings to suggest acute infection within the lumbar spine. Conus medullaris: Extends to the L1-2 level and appears normal. Paraspinal and other soft tissues: Paraspinous soft tissues demonstrate no acute abnormality. Disc levels: L1-2:  Negative. L2-3:  Negative. L3-4: No disc bulge or disc protrusion. Mild bilateral facet arthrosis. No significant stenosis. L4-5: Broad posterior central disc protrusion with slight caudad angulation. Associated annular fissure. Resultant mild subarticular stenosis without frank neural impingement. Mild bilateral foraminal narrowing, slightly worse on the left. L5-S1:  Negative. IMPRESSION: MRI THORACIC SPINE IMPRESSION: 1. 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 2. Associated epidural enhancement/phlegmon extending from T5-6 through T7  without discrete epidural abscess. Associated mild canal stenosis. 3. Associated paraspinous phlegmon at the T6-7 level. No discrete abscess or drainable fluid collection identified. MRI LUMBAR SPINE IMPRESSION: 1. No MRI evidence for acute infection within the lumbar spine. 2. Central disc protrusion at L4-5 without significant stenosis or evidence for neural impingement. 3. Mild facet arthropathy at L3-4. Electronically Signed   By: Jeannine Boga M.D.   On: 09/12/2016 02:11    Assessment:   Berthel Scull is a 45 y.o. male admitted with 1-2 months of back and rib cage pain and found to have MRI evidence of discitis and osteomyelitis at T6-7, and epidural enhancement and phlegmon. He has no obvious source. HIV neg. WBC on ly 7.4 BCX ngtd. ESR 41crp <0.8. UA neg, no evidence PNA on CT scan.  No TB risk factors, no recent procedures or infections. Echo Pending Perplexing case with no obvious source Recommendations Agree with Aspiration by IR  - please send fluid for culture, fungal and AFB. If enough is available could also send for cytology and crystal analysis as gout can cause similar findings but is rare and he has no hx gout. Once aspiration done and cultures pending can restart vanco and ceftriaxone. If bcx neg Sunday can place PICC line.  Thank you very much for allowing me to participate in the care of this patient. Please call with questions.   Cheral Marker. Ola Spurr, MD

## 2016-09-12 NOTE — Progress Notes (Signed)
Ct guided aspiration cancelled due to Lovenox in AM. Will be done tomorrow . Stopped abx till he gets procedure. D/C lovenox and start after procedure

## 2016-09-12 NOTE — ED Notes (Signed)
Patient transported to MRI 

## 2016-09-12 NOTE — ED Notes (Signed)
Pt provided meal tray and something to drink by Eileen StanfordJenna, Rn, okay per Dr. Tobi BastosPyreddy

## 2016-09-12 NOTE — H&P (Signed)
[facility] -  at [facility]   PATIENT NAME: Michael Mclean    MR#:  [id number]  DATE OF BIRTH:  Mar 22, 1971  DATE OF ADMISSION:  09/11/2016  PRIMARY CARE PHYSICIAN: Dione BoozePARTRIDGE,JAMES, MD   REQUESTING/REFERRING PHYSICIAN:   CHIEF COMPLAINT:   Chief Complaint  Patient presents with  . Chest Pain    HISTORY OF PRESENT ILLNESS: Michael KetCheron Jagoda  is a 45 y.o. male with a known history of Bipolar disorder, hyperlipidemia, hypertension presented to the emergency room with right rib cage pain radiating to the back. Pain is going on since September and it is sharp and aching in nature 7 out of 10 on a scale of 1-10. No complaints of any shortness of breath. No history of any fever or chills. Patient was managed for chest pain in September at our hospital and discharged to home. Patient says he has this pain going on since the last few weeks and was bothering him and he came to the emergency room. Patient also complains of mid thoracic and lumbar pain. No complaints of any bowel or bladder incontinence. No no paresthesias and any lower extremity weakness. Patient was worked up with CT spine and MRI spine which showed a epidural phlegmon T6-T7 level significant for osteomyelitis and discitis. Patient started on broad-spectrum IV antibiotics in the emergency room and case was discussed with Dr. Lucianne MussKumar neurosurgeon at [facility] who recommended IV antibiotics and cultures. Hospitalist service was consulted for further care of the patient. No complaints of headache, dizziness and blurry vision.  PAST MEDICAL HISTORY:   Past Medical History:  Diagnosis Date  . Anxiety disorder   . Bipolar 1 disorder (HCC)   . Hyperlipidemia   . Hypertension     PAST SURGICAL HISTORY: Past Surgical History:  Procedure Laterality Date  . none      SOCIAL HISTORY:  Social History  Substance Use Topics  . Smoking status: Never Smoker  . Smokeless tobacco: Never Used  . Alcohol use  Yes    FAMILY HISTORY: No family history on file.  DRUG ALLERGIES: No Known Allergies  REVIEW OF SYSTEMS:   CONSTITUTIONAL: No fever, fatigue or weakness.  EYES: No blurred or double vision.  EARS, NOSE, AND THROAT: No tinnitus or ear pain.  RESPIRATORY: No cough, shortness of breath, wheezing or hemoptysis.  CARDIOVASCULAR: Right rib cage pain, No orthopnea, edema.  GASTROINTESTINAL: No nausea, vomiting, diarrhea or abdominal pain.  GENITOURINARY: No dysuria, hematuria.  ENDOCRINE: No polyuria, nocturia,  HEMATOLOGY: No anemia, easy bruising or bleeding SKIN: No rash or lesion. MUSCULOSKELETAL: Mid thoracic pain   NEUROLOGIC: No tingling, numbness, weakness.  PSYCHIATRY: No anxiety or depression.   MEDICATIONS AT HOME:  Prior to Admission medications   Medication Sig Start Date End Date Taking? Authorizing Provider  ALPRAZolam Prudy Feeler(XANAX) 0.5 MG tablet Take 0.5 mg by mouth daily as needed. 05/14/15  Yes Historical Provider, MD  Armodafinil (NUVIGIL) 250 MG tablet Take 250 mg by mouth daily.   Yes Historical Provider, MD  atorvastatin (LIPITOR) 40 MG tablet Take 40 mg by mouth daily.   Yes Historical Provider, MD  buPROPion (WELLBUTRIN XL) 150 MG 24 hr tablet Take 150 mg by mouth daily.   Yes Historical Provider, MD  Eszopiclone 3 MG TABS Take 3 mg by mouth at bedtime. Take immediately before bedtime   Yes Historical Provider, MD  ibuprofen (ADVIL,MOTRIN) 400 MG tablet Take 1 tablet (400 mg total) by mouth every 6 (six) hours as needed for moderate  pain. 07/28/16  Yes Gayla Doss, MD  lamoTRIgine (LAMICTAL) 200 MG tablet Take 200 mg by mouth daily.   Yes Historical Provider, MD  SEROQUEL XR 400 MG 24 hr tablet Take 800 mg by mouth at bedtime as needed. 07/19/15  Yes Historical Provider, MD  topiramate (TOPAMAX) 100 MG tablet Take 1 tablet by mouth every morning.   Yes Historical Provider, MD  valACYclovir (VALTREX) 500 MG tablet Take 500 mg by mouth. Takes for 5 days 05/25/15  Yes  Historical Provider, MD  oxyCODONE (ROXICODONE) 5 MG immediate release tablet Take 1 tablet (5 mg total) by mouth every 6 (six) hours as needed for moderate pain. Do not drive while taking this medication. Patient not taking: Reported on 09/12/2016 07/28/16   Gayla Doss, MD      PHYSICAL EXAMINATION:   VITAL SIGNS: Blood pressure (!) 147/103, pulse 81, temperature 97.9 F (36.6 C), temperature source Oral, resp. rate (!) 22, height 5\' 11"  (1.803 m), weight 94.3 kg (208 lb), SpO2 99 %.  GENERAL:  45 y.o.-year-old patient lying in the bed with no acute distress.  EYES: Pupils equal, round, reactive to light and accommodation. No scleral icterus. Extraocular muscles intact.  HEENT: Head atraumatic, normocephalic. Oropharynx and nasopharynx clear.  NECK:  Supple, no jugular venous distention. No thyroid enlargement, no tenderness.  LUNGS: Normal breath sounds bilaterally, no wheezing, rales,rhonchi or crepitation. No use of accessory muscles of respiration.  CARDIOVASCULAR: S1, S2 normal. No murmurs, rubs, or gallops.  ABDOMEN: Soft, nontender, nondistended. Bowel sounds present. No organomegaly or mass.  Trunk : Mid thoracic tenderness EXTREMITIES: No pedal edema, cyanosis, or clubbing.  NEUROLOGIC: Cranial nerves II through XII are intact. Muscle strength 5/5 in all extremities. Sensation intact. Gait normal. PSYCHIATRIC: The patient is alert and oriented x 3.  SKIN: No obvious rash, lesion, or ulcer.   LABORATORY PANEL:   CBC  Recent Labs Lab 09/11/16 2038  WBC 7.4  HGB 14.1  HCT 42.4  PLT 323  MCV 90.2  MCH 30.0  MCHC 33.2  RDW 13.3   ------------------------------------------------------------------------------------------------------------------  Chemistries   Recent Labs Lab 09/11/16 2038  NA 142  K 3.7  CL 109  CO2 26  GLUCOSE 87  BUN 11  CREATININE 1.33*  CALCIUM 9.3    ------------------------------------------------------------------------------------------------------------------ estimated creatinine clearance is 81.4 mL/min (by C-G formula based on SCr of 1.33 mg/dL (H)). ------------------------------------------------------------------------------------------------------------------ No results for input(s): TSH, T4TOTAL, T3FREE, THYROIDAB in the last 72 hours.  Invalid input(s): FREET3   Coagulation profile No results for input(s): INR, PROTIME in the last 168 hours. ------------------------------------------------------------------------------------------------------------------- No results for input(s): DDIMER in the last 72 hours. -------------------------------------------------------------------------------------------------------------------  Cardiac Enzymes  Recent Labs Lab 09/11/16 2038  TROPONINI <0.03   ------------------------------------------------------------------------------------------------------------------ Invalid input(s): POCBNP  ---------------------------------------------------------------------------------------------------------------  Urinalysis    Component Value Date/Time   COLORURINE YELLOW (A) 09/11/2016 2230   APPEARANCEUR CLEAR (A) 09/11/2016 2230   LABSPEC 1.030 09/11/2016 2230   PHURINE 5.0 09/11/2016 2230   GLUCOSEU NEGATIVE 09/11/2016 2230   HGBUR NEGATIVE 09/11/2016 2230   BILIRUBINUR NEGATIVE 09/11/2016 2230   KETONESUR NEGATIVE 09/11/2016 2230   PROTEINUR NEGATIVE 09/11/2016 2230   UROBILINOGEN 0.2 07/20/2015 1120   NITRITE NEGATIVE 09/11/2016 2230   LEUKOCYTESUR NEGATIVE 09/11/2016 2230     RADIOLOGY: Dg Chest 2 View  Result Date: 09/11/2016 CLINICAL DATA:  Right lower rib cage pain EXAM: CHEST  2 VIEW COMPARISON:  07/28/2016 FINDINGS: Mild linear atelectasis or scarring in the lower lobe on lateral view.  No acute infiltrate or effusion. Cardiomediastinal silhouette normal. No  pneumothorax. IMPRESSION: No radiographic evidence for acute cardiopulmonary abnormality. Electronically Signed   By: Jasmine Pang M.D.   On: 09/11/2016 20:55   Ct Chest W Contrast  Result Date: 09/11/2016 CLINICAL DATA:  45 y/o M; severe pain in the right rib cage radiating around to the back. EXAM: CT CHEST WITH CONTRAST TECHNIQUE: Multidetector CT imaging of the chest was performed during intravenous contrast administration. CONTRAST:  75 cc Isovue 370 COMPARISON:  None. FINDINGS: Cardiovascular: No significant vascular findings. Normal heart size. No pericardial effusion. Mediastinum/Nodes: No enlarged mediastinal, hilar, or axillary lymph nodes. Thyroid gland, trachea, and esophagus demonstrate no significant findings. Lungs/Pleura: Lungs are clear. No pleural effusion or pneumothorax. Upper Abdomen: Multiple fluid attenuating well-circumscribed foci within the liver measuring up to 13 mm in segment VII as well as several additional smaller foci that are too small to characterize probably representing cysts. Musculoskeletal: Destructive lesion centered within T6-7 disc and endplates (series 6, image 99) and paravertebral surrounding soft tissue thickening from T5-T7 (2:63 and 5:103). IMPRESSION: Destructive lesion centered within the T6-7 disc and endplates with paravertebral soft tissue thickening. In the absence of primary malignancy this probably represents discitis osteomyelitis. Thoracic MRI with contrast is recommended to evaluate for epidural disease. These results were called by telephone at the time of interpretation on 09/11/2016 at 10:50 pm to Dr. Loleta Rose , who verbally acknowledged these results. Electronically Signed   By: Mitzi Hansen M.D.   On: 09/11/2016 22:55   Mr Thoracic Spine W Wo Contrast  Result Date: 09/12/2016 CLINICAL DATA:  Initial evaluation for acute pain at right lower rib cage, radiating around to back. EXAM: MRI THORACIC SPINE AND LUMBAR SPINE WITHOUT  AND WITH CONTRAST TECHNIQUE: Multiplanar and multiecho pulse sequences of the thoracic spine were obtained without and with intravenous contrast. CONTRAST:  19mL MULTIHANCE GADOBENATE DIMEGLUMINE 529 MG/ML IV SOLN COMPARISON:  Prior CTs from 09/11/2016. FINDINGS: MRI THORACIC SPINE FINDINGS Alignment: Vertebral bodies normally aligned with preservation of the normal thoracic kyphosis. No listhesis or malalignment. Vertebrae: There is abnormal T1 hypo intense, T2/STIR hyperintense signal intensity with postcontrast enhancement involving the T6 and T7 vertebral bodies, most consistent with osteomyelitis discitis. Abnormal edema and enhancement within the T6-7 disc space. There is disc space height loss at T6-7 with scattered endplate irregularity. No associated pathologic fracture. Abnormal epidural enhancement within the ventral epidural space at T5-6 through T7-8 without frank abscess. Abnormal edema with enhancing phlegmonous change within the adjacent paraspinous soft tissues. No discrete soft tissue abscess or drainable fluid collection identified. Remainder of the vertebral bodies are normal in appearance with normal signal intensity. Cord: Signal intensity within the thoracic spinal cord is normal. Mild canal stenosis at the level of T6-7 related to the adjacent inflammatory process. No other significant canal narrowing. The Disc levels: Note made of a few small degenerative disc bulge nodes within the thoracic spine, most notable at the T9 level. No significant stenosis. MRI LUMBAR SPINE FINDINGS Segmentation: Normal segmentation. Lowest well-formed disc is labeled the L5-S1 level. Alignment: Vertebral bodies normally aligned with preservation of the normal lumbar lordosis. No listhesis. Vertebrae: Vertebral body heights maintained. No evidence for acute or chronic fracture. Signal intensity within the vertebral body bone marrow is normal. No findings to suggest acute infection within the lumbar spine. Conus  medullaris: Extends to the L1-2 level and appears normal. Paraspinal and other soft tissues: Paraspinous soft tissues demonstrate no acute abnormality. Disc levels: L1-2:  Negative. L2-3:  Negative. L3-4: No disc bulge or disc protrusion. Mild bilateral facet arthrosis. No significant stenosis. L4-5: Broad posterior central disc protrusion with slight caudad angulation. Associated annular fissure. Resultant mild subarticular stenosis without frank neural impingement. Mild bilateral foraminal narrowing, slightly worse on the left. L5-S1:  Negative. IMPRESSION: MRI THORACIC SPINE IMPRESSION: 1. 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 2. Associated epidural enhancement/phlegmon extending from T5-6 through T7 without discrete epidural abscess. Associated mild canal stenosis. 3. Associated paraspinous phlegmon at the T6-7 level. No discrete abscess or drainable fluid collection identified. MRI LUMBAR SPINE IMPRESSION: 1. No MRI evidence for acute infection within the lumbar spine. 2. Central disc protrusion at L4-5 without significant stenosis or evidence for neural impingement. 3. Mild facet arthropathy at L3-4. Electronically Signed   By: Rise Mu M.D.   On: 09/12/2016 02:11   Mr Lumbar Spine W Wo Contrast  Result Date: 09/12/2016 CLINICAL DATA:  Initial evaluation for acute pain at right lower rib cage, radiating around to back. EXAM: MRI THORACIC SPINE AND LUMBAR SPINE WITHOUT AND WITH CONTRAST TECHNIQUE: Multiplanar and multiecho pulse sequences of the thoracic spine were obtained without and with intravenous contrast. CONTRAST:  19mL MULTIHANCE GADOBENATE DIMEGLUMINE 529 MG/ML IV SOLN COMPARISON:  Prior CTs from 09/11/2016. FINDINGS: MRI THORACIC SPINE FINDINGS Alignment: Vertebral bodies normally aligned with preservation of the normal thoracic kyphosis. No listhesis or malalignment. Vertebrae: There is abnormal T1 hypo intense, T2/STIR hyperintense signal  intensity with postcontrast enhancement involving the T6 and T7 vertebral bodies, most consistent with osteomyelitis discitis. Abnormal edema and enhancement within the T6-7 disc space. There is disc space height loss at T6-7 with scattered endplate irregularity. No associated pathologic fracture. Abnormal epidural enhancement within the ventral epidural space at T5-6 through T7-8 without frank abscess. Abnormal edema with enhancing phlegmonous change within the adjacent paraspinous soft tissues. No discrete soft tissue abscess or drainable fluid collection identified. Remainder of the vertebral bodies are normal in appearance with normal signal intensity. Cord: Signal intensity within the thoracic spinal cord is normal. Mild canal stenosis at the level of T6-7 related to the adjacent inflammatory process. No other significant canal narrowing. The Disc levels: Note made of a few small degenerative disc bulge nodes within the thoracic spine, most notable at the T9 level. No significant stenosis. MRI LUMBAR SPINE FINDINGS Segmentation: Normal segmentation. Lowest well-formed disc is labeled the L5-S1 level. Alignment: Vertebral bodies normally aligned with preservation of the normal lumbar lordosis. No listhesis. Vertebrae: Vertebral body heights maintained. No evidence for acute or chronic fracture. Signal intensity within the vertebral body bone marrow is normal. No findings to suggest acute infection within the lumbar spine. Conus medullaris: Extends to the L1-2 level and appears normal. Paraspinal and other soft tissues: Paraspinous soft tissues demonstrate no acute abnormality. Disc levels: L1-2:  Negative. L2-3:  Negative. L3-4: No disc bulge or disc protrusion. Mild bilateral facet arthrosis. No significant stenosis. L4-5: Broad posterior central disc protrusion with slight caudad angulation. Associated annular fissure. Resultant mild subarticular stenosis without frank neural impingement. Mild bilateral  foraminal narrowing, slightly worse on the left. L5-S1:  Negative. IMPRESSION: MRI THORACIC SPINE IMPRESSION: 1. 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 2. Associated epidural enhancement/phlegmon extending from T5-6 through T7 without discrete epidural abscess. Associated mild canal stenosis. 3. Associated paraspinous phlegmon at the T6-7 level. No discrete abscess or drainable fluid collection identified. MRI LUMBAR SPINE IMPRESSION: 1. No MRI evidence for acute infection within  the lumbar spine. 2. Central disc protrusion at L4-5 without significant stenosis or evidence for neural impingement. 3. Mild facet arthropathy at L3-4. Electronically Signed   By: Rise Mu M.D.   On: 09/12/2016 02:11    EKG: Orders placed or performed during the hospital encounter of 09/11/16  . EKG 12-Lead  . EKG 12-Lead  . ED EKG within 10 minutes  . ED EKG within 10 minutes    IMPRESSION AND PLAN: 45 year old male patient with history of hypertension, bipolar disorder, hyperlipidemia presented to the emergency room with mid thoracic back pain and right rib cage pain. Imaging studies in the emergency room showed osteomyelitis with discitis at T6-T7 level. No epidural abscess noted.  Admitting diagnosis 1. Osteomyelitis and discitis T5 T6 T7 level 2. Back pain secondary to discitis 3. Hypertension 4. Hyperlipidemia Treatment plan Admit patient to medical floor Start patient on IV vancomycin and IV Rocephin antibiotics Blood cultures Pain management Infectious disease consultation Interventional radiology consultation for drainage if needed Continue medication for bipolar disorder and hypertension DVT prophylaxis with subcutaneous Lovenox.  All the records are reviewed and case discussed with ED provider. Management plans discussed with the patient, family and they are in agreement.  CODE STATUS:FULL Code Status History    Date Active Date Inactive Code  Status Order ID Comments User Context   07/20/2015 11:24 AM 07/21/2015  5:03 PM Full Code [id number]  Melene Plan, DO ED       TOTAL TIME TAKING CARE OF THIS PATIENT: 56 minutes.    Ihor Austin M.D on 09/12/2016 at 3:47 AM  Between 7am to 6pm - Pager - [phone]  After 6pm go to [email or web address] - password [email or web address]  [facility]  Office  [phone]  CC: Primary care physician; Dione Booze, MD

## 2016-09-13 ENCOUNTER — Inpatient Hospital Stay: Payer: Managed Care, Other (non HMO)

## 2016-09-13 LAB — PROTIME-INR
INR: 1.07
PROTHROMBIN TIME: 13.9 s (ref 11.4–15.2)

## 2016-09-13 LAB — APTT: APTT: 38 s — AB (ref 24–36)

## 2016-09-13 MED ORDER — FENTANYL CITRATE (PF) 100 MCG/2ML IJ SOLN
INTRAMUSCULAR | Status: DC | PRN
  Administered 2016-09-13 (×2): 50 ug via INTRAVENOUS

## 2016-09-13 MED ORDER — MIDAZOLAM HCL 5 MG/5ML IJ SOLN
INTRAMUSCULAR | Status: DC | PRN
  Administered 2016-09-13 (×2): 1 mg via INTRAVENOUS

## 2016-09-13 MED ORDER — ENOXAPARIN SODIUM 40 MG/0.4ML ~~LOC~~ SOLN
40.0000 mg | SUBCUTANEOUS | Status: DC
  Administered 2016-09-13 – 2016-09-15 (×3): 40 mg via SUBCUTANEOUS
  Filled 2016-09-13 (×3): qty 0.4

## 2016-09-13 MED ORDER — VANCOMYCIN HCL 10 G IV SOLR
1500.0000 mg | Freq: Two times a day (BID) | INTRAVENOUS | Status: DC
  Filled 2016-09-13: qty 1500

## 2016-09-13 MED ORDER — VANCOMYCIN HCL IN DEXTROSE 1-5 GM/200ML-% IV SOLN
1000.0000 mg | Freq: Once | INTRAVENOUS | Status: AC
  Administered 2016-09-13: 1000 mg via INTRAVENOUS
  Filled 2016-09-13: qty 200

## 2016-09-13 MED ORDER — DEXTROSE 5 % IV SOLN
2.0000 g | INTRAVENOUS | Status: DC
  Administered 2016-09-13: 2 g via INTRAVENOUS
  Filled 2016-09-13 (×2): qty 2

## 2016-09-13 MED ORDER — FENTANYL CITRATE (PF) 100 MCG/2ML IJ SOLN
INTRAMUSCULAR | Status: AC
  Filled 2016-09-13: qty 4

## 2016-09-13 MED ORDER — MIDAZOLAM HCL 5 MG/5ML IJ SOLN
INTRAMUSCULAR | Status: AC
  Filled 2016-09-13: qty 5

## 2016-09-13 NOTE — Consult Note (Addendum)
Chief Complaint: Patient was seen in consultation today for  Chief Complaint  Patient presents with  . Chest Pain   at the request of * No referring provider recorded for this case *  Referring Physician(s): * No referring provider recorded for this case *  Supervising Physician: Jolaine Click  Patient Status: [facility] - In-pt  History of Present Illness: Michael Mclean is a 45 y.o. male with suspected T6/7 discitis. He presented with back pain and was found to have findings suspicious for discitis base don CT/MR.  Past Medical History:  Diagnosis Date  . Anxiety disorder   . Bipolar 1 disorder (HCC)   . Hyperlipidemia   . Hypertension     Past Surgical History:  Procedure Laterality Date  . none      Allergies: Review of patient's allergies indicates no known allergies.  Medications: Prior to Admission medications   Medication Sig Start Date End Date Taking? Authorizing Provider  ALPRAZolam Prudy Feeler) 0.5 MG tablet Take 0.5 mg by mouth daily as needed. 05/14/15  Yes Historical Provider, MD  Armodafinil (NUVIGIL) 250 MG tablet Take 250 mg by mouth daily.   Yes Historical Provider, MD  atorvastatin (LIPITOR) 40 MG tablet Take 40 mg by mouth daily.   Yes Historical Provider, MD  buPROPion (WELLBUTRIN XL) 150 MG 24 hr tablet Take 150 mg by mouth daily.   Yes Historical Provider, MD  Eszopiclone 3 MG TABS Take 3 mg by mouth at bedtime. Take immediately before bedtime   Yes Historical Provider, MD  ibuprofen (ADVIL,MOTRIN) 400 MG tablet Take 1 tablet (400 mg total) by mouth every 6 (six) hours as needed for moderate pain. 07/28/16  Yes Gayla Doss, MD  lamoTRIgine (LAMICTAL) 200 MG tablet Take 200 mg by mouth daily.   Yes Historical Provider, MD  SEROQUEL XR 400 MG 24 hr tablet Take 800 mg by mouth at bedtime as needed. 07/19/15  Yes Historical Provider, MD  topiramate (TOPAMAX) 100 MG tablet Take 1 tablet by mouth every morning.   Yes Historical Provider, MD  valACYclovir  (VALTREX) 500 MG tablet Take 500 mg by mouth. Takes for 5 days 05/25/15  Yes Historical Provider, MD  oxyCODONE (ROXICODONE) 5 MG immediate release tablet Take 1 tablet (5 mg total) by mouth every 6 (six) hours as needed for moderate pain. Do not drive while taking this medication. Patient not taking: Reported on 09/12/2016 07/28/16   Gayla Doss, MD     History reviewed. No pertinent family history.  Social History   Social History  . Marital status: Single    Spouse name: N/A  . Number of children: N/A  . Years of education: N/A   Occupational History  . works for [employer] for [employer]    Social History Main Topics  . Smoking status: Never Smoker  . Smokeless tobacco: Never Used  . Alcohol use Yes  . Drug use: No  . Sexual activity: Not Asked   Other Topics Concern  . None   Social History Narrative  . None     Review of Systems: A 12 point ROS discussed and pertinent positives are indicated in the HPI above.  All other systems are negative.  Review of Systems  Vital Signs: BP 120/78   Pulse 83   Temp 98.3 F (36.8 C) (Oral)   Resp 16   Ht 5\' 11"  (1.803 m)   Wt 208 lb (94.3 kg)   SpO2 100%   BMI 29.01  kg/m   Physical Exam  Constitutional: He is oriented to person, place, and time. He appears well-developed and well-nourished.  HENT:  Head: Normocephalic and atraumatic.  Neurological: He is alert and oriented to person, place, and time.    Mallampati Score:     Imaging: Dg Chest 2 View  Result Date: 09/11/2016 CLINICAL DATA:  Right lower rib cage pain EXAM: CHEST  2 VIEW COMPARISON:  07/28/2016 FINDINGS: Mild linear atelectasis or scarring in the lower lobe on lateral view. No acute infiltrate or effusion. Cardiomediastinal silhouette normal. No pneumothorax. IMPRESSION: No radiographic evidence for acute cardiopulmonary abnormality. Electronically Signed   By: Jasmine PangKim  Fujinaga M.D.   On: 09/11/2016 20:55   Ct Chest W  Contrast  Result Date: 09/11/2016 CLINICAL DATA:  45 y/o M; severe pain in the right rib cage radiating around to the back. EXAM: CT CHEST WITH CONTRAST TECHNIQUE: Multidetector CT imaging of the chest was performed during intravenous contrast administration. CONTRAST:  75 cc Isovue 370 COMPARISON:  None. FINDINGS: Cardiovascular: No significant vascular findings. Normal heart size. No pericardial effusion. Mediastinum/Nodes: No enlarged mediastinal, hilar, or axillary lymph nodes. Thyroid gland, trachea, and esophagus demonstrate no significant findings. Lungs/Pleura: Lungs are clear. No pleural effusion or pneumothorax. Upper Abdomen: Multiple fluid attenuating well-circumscribed foci within the liver measuring up to 13 mm in segment VII as well as several additional smaller foci that are too small to characterize probably representing cysts. Musculoskeletal: Destructive lesion centered within T6-7 disc and endplates (series 6, image 99) and paravertebral surrounding soft tissue thickening from T5-T7 (2:63 and 5:103). IMPRESSION: Destructive lesion centered within the T6-7 disc and endplates with paravertebral soft tissue thickening. In the absence of primary malignancy this probably represents discitis osteomyelitis. Thoracic MRI with contrast is recommended to evaluate for epidural disease. These results were called by telephone at the time of interpretation on 09/11/2016 at 10:50 pm to Dr. Loleta RoseORY FORBACH , who verbally acknowledged these results. Electronically Signed   By: Mitzi HansenLance  Furusawa-Stratton M.D.   On: 09/11/2016 22:55   Mr Thoracic Spine W Wo Contrast  Result Date: 09/12/2016 CLINICAL DATA:  Initial evaluation for acute pain at right lower rib cage, radiating around to back. EXAM: MRI THORACIC SPINE AND LUMBAR SPINE WITHOUT AND WITH CONTRAST TECHNIQUE: Multiplanar and multiecho pulse sequences of the thoracic spine were obtained without and with intravenous contrast. CONTRAST:  19mL MULTIHANCE  GADOBENATE DIMEGLUMINE 529 MG/ML IV SOLN COMPARISON:  Prior CTs from 09/11/2016. FINDINGS: MRI THORACIC SPINE FINDINGS Alignment: Vertebral bodies normally aligned with preservation of the normal thoracic kyphosis. No listhesis or malalignment. Vertebrae: There is abnormal T1 hypo intense, T2/STIR hyperintense signal intensity with postcontrast enhancement involving the T6 and T7 vertebral bodies, most consistent with osteomyelitis discitis. Abnormal edema and enhancement within the T6-7 disc space. There is disc space height loss at T6-7 with scattered endplate irregularity. No associated pathologic fracture. Abnormal epidural enhancement within the ventral epidural space at T5-6 through T7-8 without frank abscess. Abnormal edema with enhancing phlegmonous change within the adjacent paraspinous soft tissues. No discrete soft tissue abscess or drainable fluid collection identified. Remainder of the vertebral bodies are normal in appearance with normal signal intensity. Cord: Signal intensity within the thoracic spinal cord is normal. Mild canal stenosis at the level of T6-7 related to the adjacent inflammatory process. No other significant canal narrowing. The Disc levels: Note made of a few small degenerative disc bulge nodes within the thoracic spine, most notable at the T9 level. No significant stenosis. MRI  LUMBAR SPINE FINDINGS Segmentation: Normal segmentation. Lowest well-formed disc is labeled the L5-S1 level. Alignment: Vertebral bodies normally aligned with preservation of the normal lumbar lordosis. No listhesis. Vertebrae: Vertebral body heights maintained. No evidence for acute or chronic fracture. Signal intensity within the vertebral body bone marrow is normal. No findings to suggest acute infection within the lumbar spine. Conus medullaris: Extends to the L1-2 level and appears normal. Paraspinal and other soft tissues: Paraspinous soft tissues demonstrate no acute abnormality. Disc levels: L1-2:   Negative. L2-3:  Negative. L3-4: No disc bulge or disc protrusion. Mild bilateral facet arthrosis. No significant stenosis. L4-5: Broad posterior central disc protrusion with slight caudad angulation. Associated annular fissure. Resultant mild subarticular stenosis without frank neural impingement. Mild bilateral foraminal narrowing, slightly worse on the left. L5-S1:  Negative. IMPRESSION: MRI THORACIC SPINE IMPRESSION: 1. 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 2. Associated epidural enhancement/phlegmon extending from T5-6 through T7 without discrete epidural abscess. Associated mild canal stenosis. 3. Associated paraspinous phlegmon at the T6-7 level. No discrete abscess or drainable fluid collection identified. MRI LUMBAR SPINE IMPRESSION: 1. No MRI evidence for acute infection within the lumbar spine. 2. Central disc protrusion at L4-5 without significant stenosis or evidence for neural impingement. 3. Mild facet arthropathy at L3-4. Electronically Signed   By: Rise MuBenjamin  McClintock M.D.   On: 09/12/2016 02:11   Mr Lumbar Spine W Wo Contrast  Result Date: 09/12/2016 CLINICAL DATA:  Initial evaluation for acute pain at right lower rib cage, radiating around to back. EXAM: MRI THORACIC SPINE AND LUMBAR SPINE WITHOUT AND WITH CONTRAST TECHNIQUE: Multiplanar and multiecho pulse sequences of the thoracic spine were obtained without and with intravenous contrast. CONTRAST:  19mL MULTIHANCE GADOBENATE DIMEGLUMINE 529 MG/ML IV SOLN COMPARISON:  Prior CTs from 09/11/2016. FINDINGS: MRI THORACIC SPINE FINDINGS Alignment: Vertebral bodies normally aligned with preservation of the normal thoracic kyphosis. No listhesis or malalignment. Vertebrae: There is abnormal T1 hypo intense, T2/STIR hyperintense signal intensity with postcontrast enhancement involving the T6 and T7 vertebral bodies, most consistent with osteomyelitis discitis. Abnormal edema and enhancement within the T6-7  disc space. There is disc space height loss at T6-7 with scattered endplate irregularity. No associated pathologic fracture. Abnormal epidural enhancement within the ventral epidural space at T5-6 through T7-8 without frank abscess. Abnormal edema with enhancing phlegmonous change within the adjacent paraspinous soft tissues. No discrete soft tissue abscess or drainable fluid collection identified. Remainder of the vertebral bodies are normal in appearance with normal signal intensity. Cord: Signal intensity within the thoracic spinal cord is normal. Mild canal stenosis at the level of T6-7 related to the adjacent inflammatory process. No other significant canal narrowing. The Disc levels: Note made of a few small degenerative disc bulge nodes within the thoracic spine, most notable at the T9 level. No significant stenosis. MRI LUMBAR SPINE FINDINGS Segmentation: Normal segmentation. Lowest well-formed disc is labeled the L5-S1 level. Alignment: Vertebral bodies normally aligned with preservation of the normal lumbar lordosis. No listhesis. Vertebrae: Vertebral body heights maintained. No evidence for acute or chronic fracture. Signal intensity within the vertebral body bone marrow is normal. No findings to suggest acute infection within the lumbar spine. Conus medullaris: Extends to the L1-2 level and appears normal. Paraspinal and other soft tissues: Paraspinous soft tissues demonstrate no acute abnormality. Disc levels: L1-2:  Negative. L2-3:  Negative. L3-4: No disc bulge or disc protrusion. Mild bilateral facet arthrosis. No significant stenosis. L4-5: Broad posterior central disc protrusion with slight  caudad angulation. Associated annular fissure. Resultant mild subarticular stenosis without frank neural impingement. Mild bilateral foraminal narrowing, slightly worse on the left. L5-S1:  Negative. IMPRESSION: MRI THORACIC SPINE IMPRESSION: 1. Findings consistent with osteomyelitis discitis centered at the  T6-7 level. 2. Associated epidural enhancement/phlegmon extending from T5-6 through T7 without discrete epidural abscess. Associated mild canal stenosis. 3. Associated paraspinous phlegmon at the T6-7 level. No discrete abscess or drainable fluid collection identified. MRI LUMBAR SPINE IMPRESSION: 1. No MRI evidence for acute infection within the lumbar spine. 2. Central disc protrusion at L4-5 without significant stenosis or evidence for neural impingement. 3. Mild facet arthropathy at L3-4. Electronically Signed   By: Rise Mu M.D.   On: 09/12/2016 02:11    Labs:  CBC:  Recent Labs  07/28/16 1830 09/11/16 2038  WBC 7.4 7.4  HGB 14.8 14.1  HCT 42.1 42.4  PLT 228 323    COAGS:  Recent Labs  09/13/16 0648  INR 1.07  APTT 38*    BMP:  Recent Labs  07/28/16 1830 09/11/16 2038  NA 137 142  K 3.8 3.7  CL 106 109  CO2 25 26  GLUCOSE 106* 87  BUN 13 11  CALCIUM 9.3 9.3  CREATININE 1.39* 1.33*  GFRNONAA 59* >60  GFRAA >60 >60    LIVER FUNCTION TESTS: No results for input(s): BILITOT, AST, ALT, ALKPHOS, PROT, ALBUMIN in the last 8760 hours.  TUMOR MARKERS: No results for input(s): AFPTM, CEA, CA199, CHROMGRNA in the last 8760 hours.  Assessment and Plan:  T6/7 discitis. CT guided aspiration of paraspinal phlegmon to follow.  Thank you for this interesting consult.  I greatly enjoyed meeting Michael Mclean and look forward to participating in their care.  A copy of this report was sent to the requesting provider on this date.  Electronically Signed: Pamelyn Bancroft, ART A 09/13/2016, 1:26 PM   I spent a total of 40 Minutes    in face to face in clinical consultation, greater than 50% of which was counseling/coordinating care for discitis.

## 2016-09-13 NOTE — Progress Notes (Signed)
[facility]         [place]   09/13/2016  Patient: Michael Mclean   Date of Birth:  [birth year]  Date of admission:  09/11/2016  Date of Discharge  09/13/2016    To Whom it May Concern:   Meldrick Earl LitesGregory  Is admitted in [facility] since 09/11/2016. He is expected to be here till 09/16/2016  If you have any questions or concerns, please don't hesitate to call.  Sincerely,   [name], Shelitha Magley R  M.D Office : [phone]   .

## 2016-09-13 NOTE — Procedures (Signed)
T6/7 Right paraspinal phlegmon aspiration Specimen sent for culture. No comp/EBL

## 2016-09-13 NOTE — Progress Notes (Signed)
Patient's IV infiltrated. [facility]. Patient moved to room 216 with this RN. Handoff given to RN on floor.

## 2016-09-13 NOTE — Progress Notes (Signed)
[employer]OUND [employer] - [place] at [facility]   PATIENT NAME: Michael Mclean    MR#:  [id number]  DATE OF BIRTH:  January 05, 1971  SUBJECTIVE:  CHIEF COMPLAINT:   Chief Complaint  Patient presents with  . Chest Pain   Back pain better Afebrile  REVIEW OF SYSTEMS:    Review of Systems  Constitutional: Negative for chills and fever.  HENT: Negative for sore throat.   Eyes: Negative for blurred vision, double vision and pain.  Respiratory: Negative for cough, hemoptysis, shortness of breath and wheezing.   Cardiovascular: Negative for chest pain, palpitations, orthopnea and leg swelling.  Gastrointestinal: Negative for abdominal pain, constipation, diarrhea, heartburn, nausea and vomiting.  Genitourinary: Negative for dysuria and hematuria.  Musculoskeletal: Positive for back pain. Negative for joint pain.  Skin: Negative for rash.  Neurological: Negative for sensory change, speech change, focal weakness and headaches.  Endo/Heme/Allergies: Does not bruise/bleed easily.  Psychiatric/Behavioral: Negative for depression. The patient is not nervous/anxious.     DRUG ALLERGIES:  No Known Allergies  VITALS:  Blood pressure 116/83, pulse 79, temperature 98.3 F (36.8 C), temperature source Oral, resp. rate 20, height 5\' 11"  (1.803 m), weight 94.3 kg (208 lb), SpO2 100 %.  PHYSICAL EXAMINATION:   Physical Exam  GENERAL:  45 y.o.-year-old patient lying in the bed with no acute distress.  EYES: Pupils equal, round, reactive to light and accommodation. No scleral icterus. Extraocular muscles intact.  HEENT: Head atraumatic, normocephalic. Oropharynx and nasopharynx clear.  NECK:  Supple, no jugular venous distention. No thyroid enlargement, no tenderness.  LUNGS: Normal breath sounds bilaterally, no wheezing, rales, rhonchi. No use of accessory muscles of respiration.  CARDIOVASCULAR: S1, S2 normal. No murmurs, rubs, or gallops.  ABDOMEN: Soft, nontender, nondistended. Bowel  sounds present. No organomegaly or mass.  EXTREMITIES: No cyanosis, clubbing or edema b/l.    NEUROLOGIC: Cranial nerves II through XII are intact. No focal Motor or sensory deficits b/l.   PSYCHIATRIC: The patient is alert and oriented x 3.  SKIN: No obvious rash, lesion, or ulcer.   Ponit tenderness upper spine  LABORATORY PANEL:   CBC  Recent Labs Lab 09/11/16 2038  WBC 7.4  HGB 14.1  HCT 42.4  PLT 323   ------------------------------------------------------------------------------------------------------------------ Chemistries   Recent Labs Lab 09/11/16 2038  NA 142  K 3.7  CL 109  CO2 26  GLUCOSE 87  BUN 11  CREATININE 1.33*  CALCIUM 9.3   ------------------------------------------------------------------------------------------------------------------  Cardiac Enzymes  Recent Labs Lab 09/11/16 2038  TROPONINI <0.03   ------------------------------------------------------------------------------------------------------------------  RADIOLOGY:  Dg Chest 2 View  Result Date: 09/11/2016 CLINICAL DATA:  Right lower rib cage pain EXAM: CHEST  2 VIEW COMPARISON:  07/28/2016 FINDINGS: Mild linear atelectasis or scarring in the lower lobe on lateral view. No acute infiltrate or effusion. Cardiomediastinal silhouette normal. No pneumothorax. IMPRESSION: No radiographic evidence for acute cardiopulmonary abnormality. Electronically Signed   By: Jasmine Pang M.D.   On: 09/11/2016 20:55   Ct Chest W Contrast  Result Date: 09/11/2016 CLINICAL DATA:  45 y/o M; severe pain in the right rib cage radiating around to the back. EXAM: CT CHEST WITH CONTRAST TECHNIQUE: Multidetector CT imaging of the chest was performed during intravenous contrast administration. CONTRAST:  75 cc Isovue 370 COMPARISON:  None. FINDINGS: Cardiovascular: No significant vascular findings. Normal heart size. No pericardial effusion. Mediastinum/Nodes: No enlarged mediastinal, hilar, or axillary  lymph nodes. Thyroid gland, trachea, and esophagus demonstrate no significant findings. Lungs/Pleura: Lungs are clear. No  pleural effusion or pneumothorax. Upper Abdomen: Multiple fluid attenuating well-circumscribed foci within the liver measuring up to 13 mm in segment VII as well as several additional smaller foci that are too small to characterize probably representing cysts. Musculoskeletal: Destructive lesion centered within T6-7 disc and endplates (series 6, image 99) and paravertebral surrounding soft tissue thickening from T5-T7 (2:63 and 5:103). IMPRESSION: Destructive lesion centered within the T6-7 disc and endplates with paravertebral soft tissue thickening. In the absence of primary malignancy this probably represents discitis osteomyelitis. Thoracic MRI with contrast is recommended to evaluate for epidural disease. These results were called by telephone at the time of interpretation on 09/11/2016 at 10:50 pm to Dr. Loleta Rose , who verbally acknowledged these results. Electronically Signed   By: Mitzi Hansen M.D.   On: 09/11/2016 22:55   Mr Thoracic Spine W Wo Contrast  Result Date: 09/12/2016 CLINICAL DATA:  Initial evaluation for acute pain at right lower rib cage, radiating around to back. EXAM: MRI THORACIC SPINE AND LUMBAR SPINE WITHOUT AND WITH CONTRAST TECHNIQUE: Multiplanar and multiecho pulse sequences of the thoracic spine were obtained without and with intravenous contrast. CONTRAST:  19mL MULTIHANCE GADOBENATE DIMEGLUMINE 529 MG/ML IV SOLN COMPARISON:  Prior CTs from 09/11/2016. FINDINGS: MRI THORACIC SPINE FINDINGS Alignment: Vertebral bodies normally aligned with preservation of the normal thoracic kyphosis. No listhesis or malalignment. Vertebrae: There is abnormal T1 hypo intense, T2/STIR hyperintense signal intensity with postcontrast enhancement involving the T6 and T7 vertebral bodies, most consistent with osteomyelitis discitis. Abnormal edema and enhancement  within the T6-7 disc space. There is disc space height loss at T6-7 with scattered endplate irregularity. No associated pathologic fracture. Abnormal epidural enhancement within the ventral epidural space at T5-6 through T7-8 without frank abscess. Abnormal edema with enhancing phlegmonous change within the adjacent paraspinous soft tissues. No discrete soft tissue abscess or drainable fluid collection identified. Remainder of the vertebral bodies are normal in appearance with normal signal intensity. Cord: Signal intensity within the thoracic spinal cord is normal. Mild canal stenosis at the level of T6-7 related to the adjacent inflammatory process. No other significant canal narrowing. The Disc levels: Note made of a few small degenerative disc bulge nodes within the thoracic spine, most notable at the T9 level. No significant stenosis. MRI LUMBAR SPINE FINDINGS Segmentation: Normal segmentation. Lowest well-formed disc is labeled the L5-S1 level. Alignment: Vertebral bodies normally aligned with preservation of the normal lumbar lordosis. No listhesis. Vertebrae: Vertebral body heights maintained. No evidence for acute or chronic fracture. Signal intensity within the vertebral body bone marrow is normal. No findings to suggest acute infection within the lumbar spine. Conus medullaris: Extends to the L1-2 level and appears normal. Paraspinal and other soft tissues: Paraspinous soft tissues demonstrate no acute abnormality. Disc levels: L1-2:  Negative. L2-3:  Negative. L3-4: No disc bulge or disc protrusion. Mild bilateral facet arthrosis. No significant stenosis. L4-5: Broad posterior central disc protrusion with slight caudad angulation. Associated annular fissure. Resultant mild subarticular stenosis without frank neural impingement. Mild bilateral foraminal narrowing, slightly worse on the left. L5-S1:  Negative. IMPRESSION: MRI THORACIC SPINE IMPRESSION: 1. Findings consistent with osteomyelitis discitis  centered at the T6-7 level. 2. Associated epidural enhancement/phlegmon extending from T5-6 through T7 without discrete epidural abscess. Associated mild canal stenosis. 3. Associated paraspinous phlegmon at the T6-7 level. No discrete abscess or drainable fluid collection identified. MRI LUMBAR SPINE IMPRESSION: 1. No MRI evidence for acute infection within the lumbar spine. 2. Central disc protrusion at L4-5 without  significant stenosis or evidence for neural impingement. 3. Mild facet arthropathy at L3-4. Electronically Signed   By: Rise MuBenjamin  McClintock M.D.   On: 09/12/2016 02:11   Mr Lumbar Spine W Wo Contrast  Result Date: 09/12/2016 CLINICAL DATA:  Initial evaluation for acute pain at right lower rib cage, radiating around to back. EXAM: MRI THORACIC SPINE AND LUMBAR SPINE WITHOUT AND WITH CONTRAST TECHNIQUE: Multiplanar and multiecho pulse sequences of the thoracic spine were obtained without and with intravenous contrast. CONTRAST:  19mL MULTIHANCE GADOBENATE DIMEGLUMINE 529 MG/ML IV SOLN COMPARISON:  Prior CTs from 09/11/2016. FINDINGS: MRI THORACIC SPINE FINDINGS Alignment: Vertebral bodies normally aligned with preservation of the normal thoracic kyphosis. No listhesis or malalignment. Vertebrae: There is abnormal T1 hypo intense, T2/STIR hyperintense signal intensity with postcontrast enhancement involving the T6 and T7 vertebral bodies, most consistent with osteomyelitis discitis. Abnormal edema and enhancement within the T6-7 disc space. There is disc space height loss at T6-7 with scattered endplate irregularity. No associated pathologic fracture. Abnormal epidural enhancement within the ventral epidural space at T5-6 through T7-8 without frank abscess. Abnormal edema with enhancing phlegmonous change within the adjacent paraspinous soft tissues. No discrete soft tissue abscess or drainable fluid collection identified. Remainder of the vertebral bodies are normal in appearance with normal signal  intensity. Cord: Signal intensity within the thoracic spinal cord is normal. Mild canal stenosis at the level of T6-7 related to the adjacent inflammatory process. No other significant canal narrowing. The Disc levels: Note made of a few small degenerative disc bulge nodes within the thoracic spine, most notable at the T9 level. No significant stenosis. MRI LUMBAR SPINE FINDINGS Segmentation: Normal segmentation. Lowest well-formed disc is labeled the L5-S1 level. Alignment: Vertebral bodies normally aligned with preservation of the normal lumbar lordosis. No listhesis. Vertebrae: Vertebral body heights maintained. No evidence for acute or chronic fracture. Signal intensity within the vertebral body bone marrow is normal. No findings to suggest acute infection within the lumbar spine. Conus medullaris: Extends to the L1-2 level and appears normal. Paraspinal and other soft tissues: Paraspinous soft tissues demonstrate no acute abnormality. Disc levels: L1-2:  Negative. L2-3:  Negative. L3-4: No disc bulge or disc protrusion. Mild bilateral facet arthrosis. No significant stenosis. L4-5: Broad posterior central disc protrusion with slight caudad angulation. Associated annular fissure. Resultant mild subarticular stenosis without frank neural impingement. Mild bilateral foraminal narrowing, slightly worse on the left. L5-S1:  Negative. IMPRESSION: MRI THORACIC SPINE IMPRESSION: 1. 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 2. Associated epidural enhancement/phlegmon extending from T5-6 through T7 without discrete epidural abscess. Associated mild canal stenosis. 3. Associated paraspinous phlegmon at the T6-7 level. No discrete abscess or drainable fluid collection identified. MRI LUMBAR SPINE IMPRESSION: 1. No MRI evidence for acute infection within the lumbar spine. 2. Central disc protrusion at L4-5 without significant stenosis or evidence for neural impingement. 3. Mild facet  arthropathy at L3-4. Electronically Signed   By: Rise MuBenjamin  McClintock M.D.   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 ID consulted fluid for culture, fungal and AFB  * Hypertension Home meds  * Hyperlipidemia  All the records are reviewed and case discussed with Care Management/Social Workerr. Management plans discussed with the patient, family and they are in agreement.  CODE STATUS: FULL CODE  DVT Prophylaxis: SCDs  TOTAL TIME TAKING CARE OF THIS PATIENT: 35 minutes.   POSSIBLE D/C IN 3-4 DAYS, DEPENDING ON CLINICAL CONDITION.  [name], Anubis Fundora R M.D on 09/13/2016 at 9:50 AM  Between 7am to 6pm - Pager - [phone]  After 6pm go to [email or web address] - password [email or web address] [facility]  [facility] [place] [facility]  Office  [phone]  CC: Primary care physician; Dione BoozePARTRIDGE,JAMES, MD  Note: This dictation was prepared with Dragon dictation along with smaller phrase technology. Any transcriptional errors that result from this process are unintentional.

## 2016-09-13 NOTE — Progress Notes (Signed)
Pharmacy Antibiotic Note  Michael Mclean is a 45 y.o. 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 Pharmacy has been consulted for Vancomycin and ceftriaxone dosing.  Plan:  Ke: 0.072   T1/2:  9.63    Vd: 66  Patient received vancomycin 1gm IV X1. Will start Vancomycin 1500mg  Iv every 12 hours with 6 hours stack dosing. Calculated trough at Css is 18. Will draw trough level prior to 4th dose. Will continue to monitor patients renal function and adjust dose as needed.   Will start the patient on ceftriaxone 2gm IV every 24 hours.   Height: 5\' 11"  (180.3 cm) Weight: 208 lb (94.3 kg) IBW/kg (Calculated) : 75.3  Temp (24hrs), Avg:98 F (36.7 C), Min:97.5 F (36.4 C), Max:98.3 F (36.8 C)   Recent Labs Lab 09/11/16 2038  WBC 7.4  CREATININE 1.33*    Estimated Creatinine Clearance: 81.4 mL/min (by C-G formula based on SCr of 1.33 mg/dL (H)).    No Known Allergies  Antimicrobials this admission: 10/28 Ceftriaxone >> 10/28 Vancomycin >>  Dose adjustments this admission:  Microbiology results: 10/26  BCx: NG x 2 days 10/28 Body Fluid Cx:  Thank you for allowing pharmacy to be a part of this patient's care.  Cher NakaiSheema Larena Ohnemus, PharmD Clinical Pharmacist 09/13/2016 5:43 PM

## 2016-09-14 LAB — BASIC METABOLIC PANEL
ANION GAP: 6 (ref 5–15)
BUN: 11 mg/dL (ref 6–20)
CHLORIDE: 108 mmol/L (ref 101–111)
CO2: 26 mmol/L (ref 22–32)
Calcium: 9.3 mg/dL (ref 8.9–10.3)
Creatinine, Ser: 1.15 mg/dL (ref 0.61–1.24)
GFR calc Af Amer: >60 mL/min (ref >60)
GLUCOSE: 94 mg/dL (ref 65–99)
POTASSIUM: 3.9 mmol/L (ref 3.5–5.1)
SODIUM: 140 mmol/L (ref 135–145)

## 2016-09-14 LAB — ECHOCARDIOGRAM COMPLETE
Height: 71 in
WEIGHTICAEL: 3328 [oz_av]

## 2016-09-14 MED ORDER — CEFTRIAXONE SODIUM-DEXTROSE 2-2.22 GM-% IV SOLR
2.0000 g | INTRAVENOUS | Status: DC
  Administered 2016-09-14 – 2016-09-15 (×2): 2 g via INTRAVENOUS
  Filled 2016-09-14 (×3): qty 50

## 2016-09-14 MED ORDER — VANCOMYCIN HCL 10 G IV SOLR
1500.0000 mg | Freq: Two times a day (BID) | INTRAVENOUS | Status: DC
  Administered 2016-09-14 – 2016-09-16 (×4): 1500 mg via INTRAVENOUS
  Filled 2016-09-14 (×6): qty 1500

## 2016-09-14 MED ORDER — OXYCODONE HCL 5 MG PO TABS
5.0000 mg | ORAL_TABLET | Freq: Once | ORAL | Status: AC
  Administered 2016-09-14: 5 mg via ORAL

## 2016-09-14 NOTE — Progress Notes (Signed)
[employer] - [place] at [facility]   PATIENT NAME: Michael Mclean    MR#:  [id number]  DATE OF BIRTH:  September 14, 1971  SUBJECTIVE:  CHIEF COMPLAINT:   Chief Complaint  Patient presents with  . Chest Pain   Continues to have upper back pain. This had improved but worsened after procedure. Now tolerable again. No weakness or numbness of lower extremity's. Afebrile.  REVIEW OF SYSTEMS:    Review of Systems  Constitutional: Negative for chills and fever.  HENT: Negative for sore throat.   Eyes: Negative for blurred vision, double vision and pain.  Respiratory: Negative for cough, hemoptysis, shortness of breath and wheezing.   Cardiovascular: Negative for chest pain, palpitations, orthopnea and leg swelling.  Gastrointestinal: Negative for abdominal pain, constipation, diarrhea, heartburn, nausea and vomiting.  Genitourinary: Negative for dysuria and hematuria.  Musculoskeletal: Positive for back pain. Negative for joint pain.  Skin: Negative for rash.  Neurological: Negative for sensory change, speech change, focal weakness and headaches.  Endo/Heme/Allergies: Does not bruise/bleed easily.  Psychiatric/Behavioral: Negative for depression. The patient is not nervous/anxious.     DRUG ALLERGIES:  No Known Allergies  VITALS:  Blood pressure 100/83, pulse 75, temperature 98.6 F (37 C), temperature source Oral, resp. rate 18, height 5\' 11"  (1.803 m), weight 94.3 kg (208 lb), SpO2 99 %.  PHYSICAL EXAMINATION:   Physical Exam  GENERAL:  [age] y.o.-year-old patient lying in the bed with no acute distress.  EYES: Pupils equal, round, reactive to light and accommodation. No scleral icterus. Extraocular muscles intact.  HEENT: Head atraumatic, normocephalic. Oropharynx and nasopharynx clear.  NECK:  Supple, no jugular venous distention. No thyroid enlargement, no tenderness.  LUNGS: Normal breath sounds bilaterally, no wheezing, rales, rhonchi. No use of accessory  muscles of respiration.  CARDIOVASCULAR: S1, S2 normal. No murmurs, rubs, or gallops.  ABDOMEN: Soft, nontender, nondistended. Bowel sounds present. No organomegaly or mass.  EXTREMITIES: No cyanosis, clubbing or edema b/l.    NEUROLOGIC: Cranial nerves II through XII are intact. No focal Motor or sensory deficits b/l.   PSYCHIATRIC: The patient is alert and oriented x 3.  SKIN: No obvious rash, lesion, or ulcer.   Ponit tenderness upper spine  LABORATORY PANEL:   CBC  Recent Labs Lab 09/11/16 2038  WBC 7.4  HGB 14.1  HCT 42.4  PLT 323   ------------------------------------------------------------------------------------------------------------------ Chemistries   Recent Labs Lab 09/11/16 2038  NA 142  K 3.7  CL 109  CO2 26  GLUCOSE 87  BUN 11  CREATININE 1.33*  CALCIUM 9.3   ------------------------------------------------------------------------------------------------------------------  Cardiac Enzymes  Recent Labs Lab 09/11/16 2038  TROPONINI <0.03   ------------------------------------------------------------------------------------------------------------------  RADIOLOGY:  Ct Aspiration  Result Date: 09/13/2016 INDICATION: T6-7 discitis EXAM: CT GUIDANCE NEEDLE PLACEMENT MEDICATIONS: The patient is currently admitted to the hospital and receiving intravenous antibiotics. The antibiotics were administered within an appropriate time frame prior to the initiation of the procedure. ANESTHESIA/SEDATION: Fentanyl 100 mcg IV; Versed 2 mg IV Moderate Sedation Time:  15 The patient was continuously monitored during the procedure by the interventional radiology nurse under my direct supervision. COMPLICATIONS: None immediate. PROCEDURE: Informed written consent was obtained from the patient after a thorough discussion of the procedural risks, benefits and alternatives. All questions were addressed. Maximal Sterile Barrier Technique was utilized including caps, mask,  sterile gowns, sterile gloves, sterile drape, hand hygiene and skin antiseptic. A timeout was performed prior to the initiation of the procedure. 1% lidocaine was utilized for local anesthesia.  Under CT guidance, an 18 gauge needle was inserted into the right paraspinal phlegmon via paraspinal approach. Aspiration yielded a few bloody fragments of perispinal tissue. This was sent for culture. Followup imaging was performed. FINDINGS: Imaging documents needle placement in the right paraspinal phlegmon at the T6-T7 level. Post aspiration imaging demonstrates no evidence of pneumothorax. IMPRESSION: Successful the T6-7 right paraspinal phlegmon aspiration as described above. A sample was sent for culture. Electronically Signed   By: Jolaine ClickArthur  Hoss M.D.   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 ID consulted and appreciate input fluid for culture, fungal and AFB Discussed with micro-lab  * Hyperlipidemia  * Bipolar disorder Continue home medications   All the records are reviewed and case discussed with Care Management/Social Workerr. Management plans discussed with the patient, family and they are in agreement.  CODE STATUS: FULL CODE  DVT Prophylaxis: SCDs  TOTAL TIME TAKING CARE OF THIS PATIENT: 35 minutes.   POSSIBLE D/C IN 3-4 DAYS, DEPENDING ON CLINICAL CONDITION.  [name], [name] R M.D on 09/14/2016 at 10:38 AM  Between 7am to 6pm - Pager - [phone]  After 6pm go to [email or web address] - password [email or web address] [facility]  [facility] [place] [facility]  Office  [phone]  CC: Primary care physician; Dione BoozePARTRIDGE,JAMES, MD  Note: This dictation was prepared with Dragon dictation along with smaller phrase technology. Any transcriptional errors that result from this process are unintentional.

## 2016-09-14 NOTE — Progress Notes (Signed)
Pharmacy Antibiotic Note  Michael Mclean is a 45 y.o. 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 Pharmacy has been consulted for Vancomycin and ceftriaxone dosing.  Plan:  Ke: 0.072   T1/2:  9.63    Vd: 66  Patient received vancomycin 1gm IV X1. Will start Vancomycin 1500mg  Iv every 12 hours with 6 hours stack dosing. Calculated trough at Css is 18. Will draw trough level prior to 4th dose. Will continue to monitor patients renal function and adjust dose as needed.  Will start the patient on ceftriaxone 2gm IV every 24 hours.   10/29: Scr 1.15  Ke=0.075  T1/2=9.24  Vd 66. Will continue Vancomycin 1500 mg Q12h. F/u renal fxn. Timing adjusted. Check trough 10/31.    Height: 5\' 11"  (180.3 cm) Weight: 208 lb (94.3 kg) IBW/kg (Calculated) : 75.3  Temp (24hrs), Avg:98.5 F (36.9 C), Min:97.5 F (36.4 C), Max:99.2 F (37.3 C)   Recent Labs Lab 09/11/16 2038 09/14/16 1114  WBC 7.4  --   CREATININE 1.33* 1.15    Estimated Creatinine Clearance: 94.1 mL/min (by C-G formula based on SCr of 1.15 mg/dL).    No Known Allergies  Antimicrobials this admission: 10/28 Ceftriaxone >> 10/28 Vancomycin >>  Dose adjustments this admission:  Microbiology results: 10/26  BCx: NG x 2 days 10/28 Body Fluid Cx:  Thank you for allowing pharmacy to be a part of this patient's care.  Bari MantisKristin Janit Cutter PharmD Clinical Pharmacist 09/14/2016

## 2016-09-15 ENCOUNTER — Inpatient Hospital Stay: Payer: Managed Care, Other (non HMO)

## 2016-09-15 LAB — ACID FAST SMEAR (AFB): ACID FAST SMEAR - AFSCU2: NEGATIVE

## 2016-09-15 LAB — CBC
HCT: 38.7 % — ABNORMAL LOW (ref 40.0–52.0)
HEMOGLOBIN: 13.3 g/dL (ref 13.0–18.0)
MCH: 30.4 pg (ref 26.0–34.0)
MCHC: 34.3 g/dL (ref 32.0–36.0)
MCV: 88.6 fL (ref 80.0–100.0)
PLATELETS: 242 10*3/uL (ref 150–440)
RBC: 4.37 MIL/uL — ABNORMAL LOW (ref 4.40–5.90)
RDW: 13.1 % (ref 11.5–14.5)
WBC: 3.4 10*3/uL — ABNORMAL LOW (ref 3.8–10.6)

## 2016-09-15 LAB — BASIC METABOLIC PANEL
Anion gap: 4 — ABNORMAL LOW (ref 5–15)
BUN: 13 mg/dL (ref 6–20)
CALCIUM: 8.6 mg/dL — AB (ref 8.9–10.3)
CHLORIDE: 115 mmol/L — AB (ref 101–111)
CO2: 22 mmol/L (ref 22–32)
CREATININE: 0.98 mg/dL (ref 0.61–1.24)
Glucose, Bld: 110 mg/dL — ABNORMAL HIGH (ref 65–99)
Potassium: 3.7 mmol/L (ref 3.5–5.1)
SODIUM: 141 mmol/L (ref 135–145)

## 2016-09-15 MED ORDER — POLYETHYLENE GLYCOL 3350 17 G PO PACK: 17.0000 g | PACK | Freq: Every day | ORAL | Status: DC | PRN

## 2016-09-15 MED ORDER — TOPIRAMATE 25 MG PO TABS
100.0000 mg | ORAL_TABLET | Freq: Every day | ORAL | Status: DC
  Administered 2016-09-16: 100 mg via ORAL
  Filled 2016-09-15: qty 4

## 2016-09-15 NOTE — Progress Notes (Signed)
Infectious Disease Long Term IV Antibiotic Orders  Diagnosis: T6-7 vertebral osteomyelitis, discitis  Culture results - Pending  Lab Results  Component Value Date   ESRSEDRATE 41 (H) 09/12/2016   Lab Results  Component Value Date   CRP <0.8 09/12/2016   Lab Results  Component Value Date   CREATININE 0.98 09/15/2016   Lab Results  Component Value Date   HGB 13.3 09/15/2016   Allergies: No Known Allergies  Discharge antibiotics Vancomycin       1500           mg  every   12            hours .     Goal vancomycin trough 15-20.    Pharmacy to adjust dosing based on levels Ceftriaxone 2 grams every         24      hours  PICC Care per protocol Labs weekly while on IV antibiotics      CBC w diff   Comprehensive met panel Vancomycin Trough   CRP  ESR   Planned duration of antibiotics 6 weeks  Stop date Dec 9th 2017 Follow up clinic date - within 2 weeks   FAX weekly labs to [phone]   P , MD  

## 2016-09-15 NOTE — Care Management (Signed)
Patient admitted from home with osteomyelitis, discitis.  Patient lives at home with his girlfriend and fhild.  PCP M[facility]les.  [facility] [address]  Patient to discharge with home IV antibiotics.  Per MD PICC line to be placed today.  Patient was offered agency choice and [facility] was selected.  He has called [facility], and they are in network with [facility].  I have notified Barbara CowerJason with [facility] of heads up referral RNCM following.

## 2016-09-15 NOTE — Progress Notes (Signed)
[employer] [facility] - [place] at [facility]   PATIENT NAME: Michael Mclean    MR#:  [id number]  DATE OF BIRTH:  Nov 15, 1971  SUBJECTIVE:  CHIEF COMPLAINT:   Chief Complaint  Patient presents with  . Chest Pain   Upper back pain. Constipation.  REVIEW OF SYSTEMS:    Review of Systems  Constitutional: Negative for chills and fever.  HENT: Negative for sore throat.   Eyes: Negative for blurred vision, double vision and pain.  Respiratory: Negative for cough, hemoptysis, shortness of breath and wheezing.   Cardiovascular: Negative for chest pain, palpitations, orthopnea and leg swelling.  Gastrointestinal: Negative for abdominal pain, constipation, diarrhea, heartburn, nausea and vomiting.  Genitourinary: Negative for dysuria and hematuria.  Musculoskeletal: Positive for back pain. Negative for joint pain.  Skin: Negative for rash.  Neurological: Negative for sensory change, speech change, focal weakness and headaches.  Endo/Heme/Allergies: Does not bruise/bleed easily.  Psychiatric/Behavioral: Negative for depression. The patient is not nervous/anxious.     DRUG ALLERGIES:  No Known Allergies  VITALS:  Blood pressure 112/68, pulse 73, temperature 98 F (36.7 C), temperature source Oral, resp. rate 20, height 5\' 11"  (1.803 m), weight 94.3 kg (208 lb), SpO2 100 %.  PHYSICAL EXAMINATION:   Physical Exam  GENERAL:  45 y.o.-year-old patient lying in the bed with no acute distress.  EYES: Pupils equal, round, reactive to light and accommodation. No scleral icterus. Extraocular muscles intact.  HEENT: Head atraumatic, normocephalic. Oropharynx and nasopharynx clear.  NECK:  Supple, no jugular venous distention. No thyroid enlargement, no tenderness.  LUNGS: Normal breath sounds bilaterally, no wheezing, rales, rhonchi. No use of accessory muscles of respiration.  CARDIOVASCULAR: S1, S2 normal. No murmurs, rubs, or gallops.  ABDOMEN: Soft, nontender, nondistended. Bowel  sounds present. No organomegaly or mass.  EXTREMITIES: No cyanosis, clubbing or edema b/l.    NEUROLOGIC: Cranial nerves II through XII are intact. No focal Motor or sensory deficits b/l.   PSYCHIATRIC: The patient is alert and oriented x 3.  SKIN: No obvious rash, lesion, or ulcer.   Ponit tenderness upper spine  LABORATORY PANEL:   CBC  Recent Labs Lab 09/15/16 0500  WBC 3.4*  HGB 13.3  HCT 38.7*  PLT 242   ------------------------------------------------------------------------------------------------------------------ Chemistries   Recent Labs Lab 09/15/16 0500  NA 141  K 3.7  CL 115*  CO2 22  GLUCOSE 110*  BUN 13  CREATININE 0.98  CALCIUM 8.6*   ------------------------------------------------------------------------------------------------------------------  Cardiac Enzymes  Recent Labs Lab 09/11/16 2038  TROPONINI <0.03   ------------------------------------------------------------------------------------------------------------------  RADIOLOGY:  Ct Aspiration  Result Date: 09/13/2016 INDICATION: T6-7 discitis EXAM: CT GUIDANCE NEEDLE PLACEMENT MEDICATIONS: The patient is currently admitted to the hospital and receiving intravenous antibiotics. The antibiotics were administered within an appropriate time frame prior to the initiation of the procedure. ANESTHESIA/SEDATION: Fentanyl 100 mcg IV; Versed 2 mg IV Moderate Sedation Time:  15 The patient was continuously monitored during the procedure by the interventional radiology nurse under my direct supervision. COMPLICATIONS: None immediate. PROCEDURE: Informed written consent was obtained from the patient after a thorough discussion of the procedural risks, benefits and alternatives. All questions were addressed. Maximal Sterile Barrier Technique was utilized including caps, mask, sterile gowns, sterile gloves, sterile drape, hand hygiene and skin antiseptic. A timeout was performed prior to the initiation of  the procedure. 1% lidocaine was utilized for local anesthesia. Under CT guidance, an 18 gauge needle was inserted into the right paraspinal phlegmon via paraspinal approach. Aspiration yielded a  few bloody fragments of perispinal tissue. This was sent for culture. Followup imaging was performed. FINDINGS: Imaging documents needle placement in the right paraspinal phlegmon at the T6-T7 level. Post aspiration imaging demonstrates no evidence of pneumothorax. IMPRESSION: Successful the T6-7 right paraspinal phlegmon aspiration as described above. A sample was sent for culture. Electronically Signed   By: Jolaine ClickArthur  Hoss M.D.   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 ID consulted and appreciate input fluid for culture, fungal and AFB PICC line. IV antibiotics for 6 weeks of discharge. We'll discuss with ID.  * Hyperlipidemia  * Bipolar disorder Continue home medications  * Constipation On Colace. MiraLAX when necessary.   All the records are reviewed and case discussed with Care Management/Social Workerr. Management plans discussed with the patient, family and they are in agreement.  CODE STATUS: FULL CODE  DVT Prophylaxis: SCDs  TOTAL TIME TAKING CARE OF THIS PATIENT: 35 minutes.   POSSIBLE D/C IN 3-4 DAYS, DEPENDING ON CLINICAL CONDITION.  [name], Argie Lober R M.D on 09/15/2016 at 11:21 AM  Between 7am to 6pm - Pager - [phone]  After 6pm go to [email or web address] - password [email or web address] [facility]  [facility] [place] [facility]  Office  [phone]  CC: Primary care physician; Dione BoozePARTRIDGE,JAMES, MD  Note: This dictation was prepared with Dragon dictation along with smaller phrase technology. Any transcriptional errors that result from this process are unintentional.

## 2016-09-15 NOTE — Progress Notes (Signed)
[facility] PROGRESS NOTE Date of Admission:  09/11/2016     ID: Devion Chriscoe is a 45 y.o. male with T spine osteo Principal Problem:   Osteomyelitis ([place])   Subjective: Aspiration done 10/28 started on vanco and ceftriaxone,. Less pain, no fevers  ROS  Eleven systems are reviewed and negative except per hpi  Medications:  Antibiotics Given (last 72 hours)    Date/Time Action Medication Dose Rate   09/13/16 1617 Given   cefTRIAXone (ROCEPHIN) 2 g in dextrose 5 % 50 mL IVPB 2 g 100 mL/hr   09/13/16 1710 Given   vancomycin (VANCOCIN) IVPB 1000 mg/200 mL premix 1,000 mg 200 mL/hr   09/14/16 1502 Given   vancomycin (VANCOCIN) 1,500 mg in sodium chloride 0.9 % 500 mL IVPB 1,500 mg 250 mL/hr   09/14/16 1713 Given  [another antibiotic running.]   cefTRIAXone (ROCEPHIN) IVPB 2 g 2 g 100 mL/hr   09/15/16 0232 Given   vancomycin (VANCOCIN) 1,500 mg in sodium chloride 0.9 % 500 mL IVPB 1,500 mg 250 mL/hr     . atorvastatin  40 mg Oral Daily  . buPROPion  150 mg Oral Daily  . cefTRIAXone  2 g Intravenous Q24H  . docusate sodium  100 mg Oral BID  . enoxaparin (LOVENOX) injection  40 mg Subcutaneous Q24H  . lamoTRIgine  200 mg Oral Daily  . QUEtiapine  400 mg Oral Daily  . topiramate  100 mg Oral BH-q7a  . vancomycin  1,500 mg Intravenous Q12H    Objective: Vital signs in last 24 hours: Temp:  [98 F (36.7 C)-98.1 F (36.7 C)] 98.1 F (36.7 C) (10/30 1405) Pulse Rate:  [70-91] 91 (10/30 1405) Resp:  [16-20] 16 (10/30 1405) BP: (112-128)/(68-81) 113/75 (10/30 1405) SpO2:  [97 %-100 %] 97 % (10/30 1405) Constitutional: He is oriented to person, place, and time. He appears well-developed and well-nourished. No distress.  HENT: anicteric Mouth/Throat: Oropharynx is clear and moist. No oropharyngeal exudate.  Cardiovascular: Normal rate, regular rhythm and normal heart sounds. Exam reveals no gallop and no friction rub.  Pulmonary/Chest: Effort normal and  breath sounds normal. No respiratory distress. He has no wheezes.  Abdominal: Soft. Bowel sounds are normal. He exhibits no distension. There is no tenderness.  Back TTP over t spine, no deformity Lymphadenopathy: He has no cervical adenopathy.  Neurological: He is alert and oriented to person, place, and time.  Skin: Skin is warm and dry. No rash noted. No erythema. No stigmata endocarditits Psychiatric: He has a normal mood and affect. His behavior is normal.    Lab Results  Recent Labs  09/14/16 1114 09/15/16 0500  WBC  --  3.4*  HGB  --  13.3  HCT  --  38.7*  NA 140 141  K 3.9 3.7  CL 108 115*  CO2 26 22  BUN 11 13  CREATININE 1.15 0.98    Microbiology: Results for orders placed or performed during the hospital encounter of 09/11/16  Blood culture (routine x 2)     Status: None (Preliminary result)   Collection Time: 09/11/16 11:18 PM  Result Value Ref Range Status   Specimen Description BLOOD  RIGHT AC  Final   Special Requests   Final    BOTTLES DRAWN AEROBIC AND ANAEROBIC  ANA 10ML AER 7ML   Culture NO GROWTH 4 DAYS  Final   Report Status PENDING  Incomplete  Blood culture (routine x 2)     Status: None (Preliminary result)  Collection Time: 09/11/16 11:18 PM  Result Value Ref Range Status   Specimen Description BLOOD  LEFT HAND  Final   Special Requests   Final    BOTTLES DRAWN AEROBIC AND ANAEROBIC  ANA 4ML AER 4ML   Culture NO GROWTH 4 DAYS  Final   Report Status PENDING  Incomplete  Body fluid culture     Status: None (Preliminary result)   Collection Time: 09/13/16  1:59 PM  Result Value Ref Range Status   Specimen Description VERTEBRA  Final   Special Requests T6/T7 DISC  Final   Gram Stain   Final    FEW WBC PRESENT, PREDOMINANTLY PMN NO ORGANISMS SEEN    Culture   Final    NO GROWTH 2 DAYS Performed at [facility]    Report Status PENDING  Incomplete  Culture, fungus without smear     Status: None (Preliminary result)   Collection  Time: 09/13/16  1:59 PM  Result Value Ref Range Status   Specimen Description VERTEBRA  Final   Special Requests T6/T7 DISC  Final   Culture   Final    NO FUNGUS ISOLATED AFTER 1 DAY Performed at [facility]    Report Status PENDING  Incomplete    Studies/Results: Dg Chest 2 View  Result Date: 09/11/2016 CLINICAL DATA:  Right lower rib cage pain EXAM: CHEST  2 VIEW COMPARISON:  07/28/2016 FINDINGS: Mild linear atelectasis or scarring in the lower lobe on lateral view. No acute infiltrate or effusion. Cardiomediastinal silhouette normal. No pneumothorax. IMPRESSION: No radiographic evidence for acute cardiopulmonary abnormality. Electronically Signed   By: Donavan Foil M.D.   On: 09/11/2016 20:55   Ct Chest W Contrast  Result Date: 09/11/2016 CLINICAL DATA:  45 y/o M; severe pain in the right rib cage radiating around to the back. EXAM: CT CHEST WITH CONTRAST TECHNIQUE: Multidetector CT imaging of the chest was performed during intravenous contrast administration. CONTRAST:  75 cc Isovue 370 COMPARISON:  None. FINDINGS: Cardiovascular: No significant vascular findings. Normal heart size. No pericardial effusion. Mediastinum/Nodes: No enlarged mediastinal, hilar, or axillary lymph nodes. Thyroid gland, trachea, and esophagus demonstrate no significant findings. Lungs/Pleura: Lungs are clear. No pleural effusion or pneumothorax. Upper Abdomen: Multiple fluid attenuating well-circumscribed foci within the liver measuring up to 13 mm in segment VII as well as several additional smaller foci that are too small to characterize probably representing cysts. Musculoskeletal: Destructive lesion centered within T6-7 disc and endplates (series 6, image 99) and paravertebral surrounding soft tissue thickening from T5-T7 (2:63 and 5:103). IMPRESSION: Destructive lesion centered within the T6-7 disc and endplates with paravertebral soft tissue thickening. In the absence of primary malignancy this  probably represents discitis osteomyelitis. Thoracic MRI with contrast is recommended to evaluate for epidural disease. These results were called by telephone at the time of interpretation on 09/11/2016 at 10:50 pm to Dr. Hinda Kehr , who verbally acknowledged these results. Electronically Signed   By: Kristine Garbe M.D.   On: 09/11/2016 22:55   Mr Thoracic Spine W Wo Contrast  Result Date: 09/12/2016 CLINICAL DATA:  Initial evaluation for acute pain at right lower rib cage, radiating around to back. EXAM: MRI THORACIC SPINE AND LUMBAR SPINE WITHOUT AND WITH CONTRAST TECHNIQUE: Multiplanar and multiecho pulse sequences of the thoracic spine were obtained without and with intravenous contrast. CONTRAST:  48m MULTIHANCE GADOBENATE DIMEGLUMINE 529 MG/ML IV SOLN COMPARISON:  Prior CTs from 09/11/2016. FINDINGS: MRI THORACIC SPINE FINDINGS Alignment: Vertebral bodies normally aligned  with preservation of the normal thoracic kyphosis. No listhesis or malalignment. Vertebrae: There is abnormal T1 hypo intense, T2/STIR hyperintense signal intensity with postcontrast enhancement involving the T6 and T7 vertebral bodies, most consistent with osteomyelitis discitis. Abnormal edema and enhancement within the T6-7 disc space. There is disc space height loss at T6-7 with scattered endplate irregularity. No associated pathologic fracture. Abnormal epidural enhancement within the ventral epidural space at T5-6 through T7-8 without frank abscess. Abnormal edema with enhancing phlegmonous change within the adjacent paraspinous soft tissues. No discrete soft tissue abscess or drainable fluid collection identified. Remainder of the vertebral bodies are normal in appearance with normal signal intensity. Cord: Signal intensity within the thoracic spinal cord is normal. Mild canal stenosis at the level of T6-7 related to the adjacent inflammatory process. No other significant canal narrowing. The Disc levels: Note made  of a few small degenerative disc bulge nodes within the thoracic spine, most notable at the T9 level. No significant stenosis. MRI LUMBAR SPINE FINDINGS Segmentation: Normal segmentation. Lowest well-formed disc is labeled the L5-S1 level. Alignment: Vertebral bodies normally aligned with preservation of the normal lumbar lordosis. No listhesis. Vertebrae: Vertebral body heights maintained. No evidence for acute or chronic fracture. Signal intensity within the vertebral body bone marrow is normal. No findings to suggest acute infection within the lumbar spine. Conus medullaris: Extends to the L1-2 level and appears normal. Paraspinal and other soft tissues: Paraspinous soft tissues demonstrate no acute abnormality. Disc levels: L1-2:  Negative. L2-3:  Negative. L3-4: No disc bulge or disc protrusion. Mild bilateral facet arthrosis. No significant stenosis. L4-5: Broad posterior central disc protrusion with slight caudad angulation. Associated annular fissure. Resultant mild subarticular stenosis without frank neural impingement. Mild bilateral foraminal narrowing, slightly worse on the left. L5-S1:  Negative. IMPRESSION: MRI THORACIC SPINE IMPRESSION: 1. 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 2. Associated epidural enhancement/phlegmon extending from T5-6 through T7 without discrete epidural abscess. Associated mild canal stenosis. 3. Associated paraspinous phlegmon at the T6-7 level. No discrete abscess or drainable fluid collection identified. MRI LUMBAR SPINE IMPRESSION: 1. No MRI evidence for acute infection within the lumbar spine. 2. Central disc protrusion at L4-5 without significant stenosis or evidence for neural impingement. 3. Mild facet arthropathy at L3-4. Electronically Signed   By: Jeannine Boga M.D.   On: 09/12/2016 02:11   Mr Lumbar Spine W Wo Contrast  Result Date: 09/12/2016 CLINICAL DATA:  Initial evaluation for acute pain at right lower rib cage,  radiating around to back. EXAM: MRI THORACIC SPINE AND LUMBAR SPINE WITHOUT AND WITH CONTRAST TECHNIQUE: Multiplanar and multiecho pulse sequences of the thoracic spine were obtained without and with intravenous contrast. CONTRAST:  46m MULTIHANCE GADOBENATE DIMEGLUMINE 529 MG/ML IV SOLN COMPARISON:  Prior CTs from 09/11/2016. FINDINGS: MRI THORACIC SPINE FINDINGS Alignment: Vertebral bodies normally aligned with preservation of the normal thoracic kyphosis. No listhesis or malalignment. Vertebrae: There is abnormal T1 hypo intense, T2/STIR hyperintense signal intensity with postcontrast enhancement involving the T6 and T7 vertebral bodies, most consistent with osteomyelitis discitis. Abnormal edema and enhancement within the T6-7 disc space. There is disc space height loss at T6-7 with scattered endplate irregularity. No associated pathologic fracture. Abnormal epidural enhancement within the ventral epidural space at T5-6 through T7-8 without frank abscess. Abnormal edema with enhancing phlegmonous change within the adjacent paraspinous soft tissues. No discrete soft tissue abscess or drainable fluid collection identified. Remainder of the vertebral bodies are normal in appearance with normal signal intensity.  Cord: Signal intensity within the thoracic spinal cord is normal. Mild canal stenosis at the level of T6-7 related to the adjacent inflammatory process. No other significant canal narrowing. The Disc levels: Note made of a few small degenerative disc bulge nodes within the thoracic spine, most notable at the T9 level. No significant stenosis. MRI LUMBAR SPINE FINDINGS Segmentation: Normal segmentation. Lowest well-formed disc is labeled the L5-S1 level. Alignment: Vertebral bodies normally aligned with preservation of the normal lumbar lordosis. No listhesis. Vertebrae: Vertebral body heights maintained. No evidence for acute or chronic fracture. Signal intensity within the vertebral body bone marrow is  normal. No findings to suggest acute infection within the lumbar spine. Conus medullaris: Extends to the L1-2 level and appears normal. Paraspinal and other soft tissues: Paraspinous soft tissues demonstrate no acute abnormality. Disc levels: L1-2:  Negative. L2-3:  Negative. L3-4: No disc bulge or disc protrusion. Mild bilateral facet arthrosis. No significant stenosis. L4-5: Broad posterior central disc protrusion with slight caudad angulation. Associated annular fissure. Resultant mild subarticular stenosis without frank neural impingement. Mild bilateral foraminal narrowing, slightly worse on the left. L5-S1:  Negative. IMPRESSION: MRI THORACIC SPINE IMPRESSION: 1. 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 2. Associated epidural enhancement/phlegmon extending from T5-6 through T7 without discrete epidural abscess. Associated mild canal stenosis. 3. Associated paraspinous phlegmon at the T6-7 level. No discrete abscess or drainable fluid collection identified. MRI LUMBAR SPINE IMPRESSION: 1. No MRI evidence for acute infection within the lumbar spine. 2. Central disc protrusion at L4-5 without significant stenosis or evidence for neural impingement. 3. Mild facet arthropathy at L3-4. Electronically Signed   By: Jeannine Boga M.D.   On: 09/12/2016 02:11   Ct Aspiration  Result Date: 09/13/2016 INDICATION: T6-7 discitis EXAM: CT GUIDANCE NEEDLE PLACEMENT MEDICATIONS: The patient is currently admitted to the hospital and receiving intravenous antibiotics. The antibiotics were administered within an appropriate time frame prior to the initiation of the procedure. ANESTHESIA/SEDATION: Fentanyl 100 mcg IV; Versed 2 mg IV Moderate Sedation Time:  15 The patient was continuously monitored during the procedure by the interventional radiology nurse under my direct supervision. COMPLICATIONS: None immediate. PROCEDURE: Informed written consent was obtained from the patient after  a thorough discussion of the procedural risks, benefits and alternatives. All questions were addressed. Maximal Sterile Barrier Technique was utilized including caps, mask, sterile gowns, sterile gloves, sterile drape, hand hygiene and skin antiseptic. A timeout was performed prior to the initiation of the procedure. 1% lidocaine was utilized for local anesthesia. Under CT guidance, an 18 gauge needle was inserted into the right paraspinal phlegmon via paraspinal approach. Aspiration yielded a few bloody fragments of perispinal tissue. This was sent for culture. Followup imaging was performed. FINDINGS: Imaging documents needle placement in the right paraspinal phlegmon at the T6-T7 level. Post aspiration imaging demonstrates no evidence of pneumothorax. IMPRESSION: Successful the T6-7 right paraspinal phlegmon aspiration as described above. A sample was sent for culture. Electronically Signed   By: Marybelle Killings M.D.   On: 09/13/2016 14:17     Assessment/Plan: Jelan Batterton is a 45 y.o. male admitted with 1-2 months of back and rib cage pain and found to have MRI evidence of discitis and osteomyelitis at T6-7, and epidural enhancement and phlegmon. He has no obvious source. HIV neg. WBC only 7.4 BCX ngtd. ESR 41crp <0.8. UA neg, no evidence PNA on CT scan.  No TB risk factors, no recent procedures or infections. Echo negative  Perplexing case with  no obvious source Culture from aspiration neg to date- had received abx prior though   Recommendations Continue vanco and ceftriaxone. Place picc See abx order sheet Will plan on 6 week course Thank you very much for the consult. Will follow with you.  [place], DAVID P   09/15/2016, 3:41 PM

## 2016-09-15 NOTE — Progress Notes (Signed)
Pt was alert and talkative but anxious over procedure to put in pick. [place] [employer] CH offered prayer. CH available for follow up.   09/15/16 1100  Clinical Encounter Type  Visited With Patient  Visit Type Initial  Consult/Referral To Nurse  Spiritual Encounters  Spiritual Needs Prayer;Emotional  Stress Factors  Patient Stress Factors None identified

## 2016-09-16 LAB — CULTURE, BLOOD (ROUTINE X 2)
CULTURE: NO GROWTH
CULTURE: NO GROWTH

## 2016-09-16 LAB — C-REACTIVE PROTEIN

## 2016-09-16 MED ORDER — OXYCODONE HCL 5 MG PO TABS: 5.0000 mg | ORAL_TABLET | Freq: Four times a day (QID) | ORAL | 0 refills | Status: DC | PRN

## 2016-09-16 NOTE — Progress Notes (Addendum)
Alert and oriented. Vital signs stable . No signs of acute distress. Discharge instructions given. Teaching given regarding PICC line access. Patient verbalized understanding. No other issues noted at this time.

## 2016-09-16 NOTE — Discharge Summary (Signed)
[facility] -  at [facility]   PATIENT NAME: Michael Mclean    MR#:  [id number]  DATE OF BIRTH:  [birth year]  DATE OF ADMISSION:  09/11/2016 ADMITTING PHYSICIAN: Ihor AustinPavan Pyreddy, MD  DATE OF DISCHARGE: 09/16/2016  1:27 PM  PRIMARY CARE PHYSICIAN: PARTRIDGE,JAMES, MD   ADMISSION DIAGNOSIS:  Acute osteomyelitis of thoracic spine (HCC) [M46.24]  DISCHARGE DIAGNOSIS:  Principal Problem:   Osteomyelitis (HCC)   SECONDARY DIAGNOSIS:   Past Medical History:  Diagnosis Date  . Anxiety disorder   . Bipolar 1 disorder (HCC)   . Hyperlipidemia   . Hypertension      ADMITTING HISTORY  HISTORY OF PRESENT ILLNESS: Michael KetCheron Cassells  is a 45 y.o. male with a known history of Bipolar disorder, hyperlipidemia, hypertension presented to the emergency room with right rib cage pain radiating to the back. Pain is going on since September and it is sharp and aching in nature 7 out of 10 on a scale of 1-10. No complaints of any shortness of breath. No history of any fever or chills. Patient was managed for chest pain in September at our hospital and discharged to home. Patient says he has this pain going on since the last few weeks and was bothering him and he came to the emergency room. Patient also complains of mid thoracic and lumbar pain. No complaints of any bowel or bladder incontinence. No no paresthesias and any lower extremity weakness. Patient was worked up with CT spine and MRI spine which showed a epidural phlegmon T6-T7 level significant for osteomyelitis and discitis. Patient started on broad-spectrum IV antibiotics in the emergency room and case was discussed with Dr. Lucianne MussKumar neurosurgeon at [facility] who recommended IV antibiotics and cultures. Hospitalist service was consulted for further care of the patient. No complaints of headache, dizziness and blurry vision.  HOSPITAL COURSE:   *Osteomyelitis and discitis  T6 -T7  With epidural abscess T5,6,7 CT-guided  aspiration with less than 1 mm sample on 09/13/2016 Restarted Vancomycin and ceftriaxone after procedure. ID consulted and appreciate input fluid for culture, fungal and AFB PICC line placed. IV antibiotics with ceftriaxone and vancomycin for 6 weeks of discharge.  His back pain is improved. Cultures have remained negative.  *Hyperlipidemia  * Bipolar disorder Continue home medications  * Constipation On Colace. MiraLAX when necessary.  Stable for discharge home to follow-up with infectious disease Dr. Sampson GoonFitzgerald as outpatient.  CONSULTS OBTAINED:  Treatment Team:  Mick Sellavid P Fitzgerald, MD  DRUG ALLERGIES:  No Known Allergies  DISCHARGE MEDICATIONS:   Discharge Medication List as of 09/16/2016 11:07 AM    CONTINUE these medications which have CHANGED   Details  oxyCODONE (ROXICODONE) 5 MG immediate release tablet Take 1 tablet (5 mg total) by mouth every 6 (six) hours as needed for severe pain. Do not drive while taking this medication., Starting Tue 09/16/2016, Print      CONTINUE these medications which have NOT CHANGED   Details  ALPRAZolam (XANAX) 0.5 MG tablet Take 0.5 mg by mouth daily as needed., Starting 05/14/2015, Until Discontinued, Historical Med    Armodafinil (NUVIGIL) 250 MG tablet Take 250 mg by mouth daily., Until Discontinued, Historical Med    atorvastatin (LIPITOR) 40 MG tablet Take 40 mg by mouth daily., Until Discontinued, Historical Med    buPROPion (WELLBUTRIN XL) 150 MG 24 hr tablet Take 150 mg by mouth daily., Until Discontinued, Historical Med    Eszopiclone 3 MG TABS Take 3 mg by mouth at bedtime. Take  immediately before bedtime, Until Discontinued, Historical Med    ibuprofen (ADVIL,MOTRIN) 400 MG tablet Take 1 tablet (400 mg total) by mouth every 6 (six) hours as needed for moderate pain., Starting Mon 07/28/2016, Print    lamoTRIgine (LAMICTAL) 200 MG tablet Take 200 mg by mouth daily., Until Discontinued, Historical Med    SEROQUEL XR  400 MG 24 hr tablet Take 800 mg by mouth at bedtime as needed., Starting 07/19/2015, Until Discontinued, Historical Med    topiramate (TOPAMAX) 100 MG tablet Take 1 tablet by mouth every morning., Historical Med    valACYclovir (VALTREX) 500 MG tablet Take 500 mg by mouth. Takes for 5 days, Starting 05/25/2015, Until Discontinued, Historical Med        Today   VITAL SIGNS:  Blood pressure 109/70, pulse 76, temperature 98.1 F (36.7 C), temperature source Oral, resp. rate 18, height 5\' 11"  (1.803 m), weight 94.3 kg (208 lb), SpO2 100 %.  I/O:   Intake/Output Summary (Last 24 hours) at 09/16/16 1330 Last data filed at 09/16/16 1019  Gross per 24 hour  Intake             1090 ml  Output                0 ml  Net             1090 ml    PHYSICAL EXAMINATION:  Physical Exam  GENERAL:  45 y.o.-year-old patient lying in the bed with no acute distress.  LUNGS: Normal breath sounds bilaterally, no wheezing, rales,rhonchi or crepitation. No use of accessory muscles of respiration.  CARDIOVASCULAR: S1, S2 normal. No murmurs, rubs, or gallops.  ABDOMEN: Soft, non-tender, non-distended. Bowel sounds present. No organomegaly or mass.  NEUROLOGIC: Moves all 4 extremities. PSYCHIATRIC: The patient is alert and oriented x 3.  SKIN: No obvious rash, lesion, or ulcer.   DATA REVIEW:   CBC  Recent Labs Lab 09/15/16 0500  WBC 3.4*  HGB 13.3  HCT 38.7*  PLT 242    Chemistries   Recent Labs Lab 09/15/16 0500  NA 141  K 3.7  CL 115*  CO2 22  GLUCOSE 110*  BUN 13  CREATININE 0.98  CALCIUM 8.6*    Cardiac Enzymes  Recent Labs Lab 09/11/16 2038  TROPONINI <0.03    Microbiology Results  Results for orders placed or performed during the hospital encounter of 09/11/16  Blood culture (routine x 2)     Status: None   Collection Time: 09/11/16 11:18 PM  Result Value Ref Range Status   Specimen Description BLOOD  RIGHT AC  Final   Special Requests   Final    BOTTLES DRAWN  AEROBIC AND ANAEROBIC  ANA 10ML AER 7ML   Culture NO GROWTH 5 DAYS  Final   Report Status 09/16/2016 FINAL  Final  Blood culture (routine x 2)     Status: None   Collection Time: 09/11/16 11:18 PM  Result Value Ref Range Status   Specimen Description BLOOD  LEFT HAND  Final   Special Requests   Final    BOTTLES DRAWN AEROBIC AND ANAEROBIC  ANA 4ML AER 4ML   Culture NO GROWTH 5 DAYS  Final   Report Status 09/16/2016 FINAL  Final  Body fluid culture     Status: None (Preliminary result)   Collection Time: 09/13/16  1:59 PM  Result Value Ref Range Status   Specimen Description VERTEBRA  Final   Special Requests T6/T7 DISC  Final  Gram Stain   Final    FEW WBC PRESENT, PREDOMINANTLY PMN NO ORGANISMS SEEN    Culture   Final    NO GROWTH 3 DAYS Performed at [facility]    Report Status PENDING  Incomplete  Culture, fungus without smear     Status: None (Preliminary result)   Collection Time: 09/13/16  1:59 PM  Result Value Ref Range Status   Specimen Description VERTEBRA  Final   Special Requests T6/T7 DISC  Final   Culture   Final    NO FUNGUS ISOLATED AFTER 2 DAYS Performed at [facility]    Report Status PENDING  Incomplete  Acid Fast Smear (AFB)     Status: None   Collection Time: 09/14/16  8:50 AM  Result Value Ref Range Status   AFB Specimen Processing Concentration  Final   Acid Fast Smear Negative  Final    Comment: (NOTE) Performed At: [facility] [address] [place], [place] [id number] Mila Homer MD EX:[id number]    Source (AFB) ABSCESS  Final    Comment:  EPIDURAL     RADIOLOGY:  Dg Chest Port 1 View  Result Date: 09/15/2016 CLINICAL DATA:  No acute cardiopulmonary process. EXAM: PORTABLE CHEST 1 VIEW COMPARISON:  Chest radiograph September 11 2016 FINDINGS: RIGHT peripherally inserted central venous catheter distal tip projects proximal RIGHT atrium. Cardiomediastinal silhouette is unremarkable for this low inspiratory  examination with crowded vasculature markings. The lungs are clear without pleural effusions or focal consolidations. Trachea projects midline and there is no pneumothorax. Obliterated T6-7 disc space corresponding to known discitis osteomyelitis. IMPRESSION: RIGHT PICC distal tip projects and proximal atrium, recommend 1-2 cm of retraction. No acute cardiopulmonary process. Electronically Signed   By: Awilda Metro M.D.   On: 09/15/2016 22:28    Follow up with PCP in 1 week.  Management plans discussed with the patient, family and they are in agreement.  CODE STATUS:     Code Status Orders        Start     Ordered   09/12/16 0515  Full code  Continuous     09/12/16 0514    Code Status History    Date Active Date Inactive Code Status Order ID Comments User Context   07/20/2015 11:24 AM 07/21/2015  5:03 PM Full Code [id number]  Melene Plan, DO ED      TOTAL TIME TAKING CARE OF THIS PATIENT ON DAY OF DISCHARGE: more than 30 minutes.   Milagros Loll R M.D on 09/16/2016 at 1:30 PM  Between 7am to 6pm - Pager - [phone]  After 6pm go to [email or web address] - password [email or web address] [facility]  [facility]  Office  [phone]  CC: Primary care physician; Dione Booze, MD  Note: This dictation was prepared with Dragon dictation along with smaller phrase technology. Any transcriptional errors that result from this process are unintentional.

## 2016-09-16 NOTE — Progress Notes (Signed)
Pt was alert and dressed and ready for discharge. Pt will drive self home. CH offered prayer.   09/16/16 1020  Clinical Encounter Type  Visited With Patient  Visit Type Follow-up;Spiritual support  Referral From Nurse  Spiritual Encounters  Spiritual Needs Prayer;Emotional  Stress Factors  Patient Stress Factors None identified

## 2016-09-16 NOTE — Discharge Instructions (Signed)
Resume prior activity and diet

## 2016-09-16 NOTE — Progress Notes (Signed)
[facility]         [place], [place]   09/16/2016  Patient: Michael Mclean   Date of Birth:  12-12-1970  Date of admission:  09/11/2016  Date of Discharge  09/16/2016    To Whom it May Concern:   Michael Mclean  may return to work on 09/17/2016.  PHYSICAL ACTIVITY:  Full  If you have any questions or concerns, please don't hesitate to call.  Sincerely,   [name], Michael Schrom R M.D Office : [phone]   .

## 2016-09-16 NOTE — Care Management (Signed)
Patient discharged home today.  Patient to discharge on IV vanc and rocephin.  [facility] to initiate home administration at 1600 today.  Notified ID office of patient's choice for home health agency.  RNCM signing off

## 2016-09-17 LAB — BODY FLUID CULTURE: Culture: NO GROWTH

## 2016-10-08 LAB — CULTURE, FUNGUS WITHOUT SMEAR

## 2016-10-28 LAB — ACID FAST CULTURE WITH REFLEXED SENSITIVITIES (MYCOBACTERIA): Acid Fast Culture: NEGATIVE

## 2017-12-05 IMAGING — MR MR LUMBAR SPINE WO/W CM
7 of 8 series · 33 of 48 positions shown · IV contrast (multihance)
Comparison: Prior CTs from 09/11/2016.

CLINICAL DATA: Initial evaluation for acute pain at right lower rib
cage, radiating around to back.

EXAM:
MRI THORACIC SPINE AND LUMBAR SPINE WITHOUT AND WITH CONTRAST
TECHNIQUE: Multiplanar and multiecho pulse sequences of the thoracic spine were
obtained without and with intravenous contrast.
CONTRAST:  19mL MULTIHANCE GADOBENATE DIMEGLUMINE 529 MG/ML IV SOLN

[Series 2: T2 · sagittal · 4.0mm · 0.81mm/px · 4 of 17 slices shown (1 of 2)]
[im 1/17]
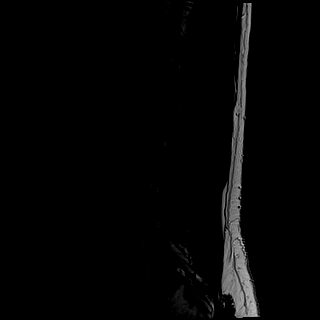
[im 6/17]
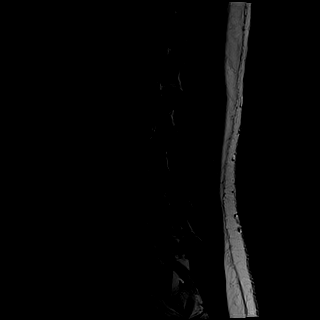
[im 11/17]
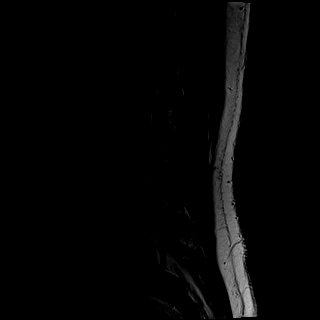
[im 17/17]
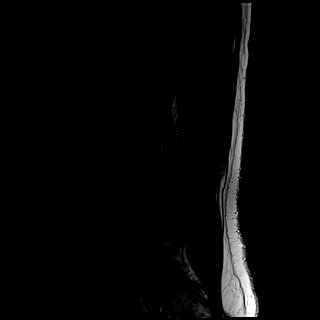

[Series 3: T1 · sagittal · 4.0mm · 0.81mm/px · 4 of 17 slices shown (1 of 2)]
[im 1/17]
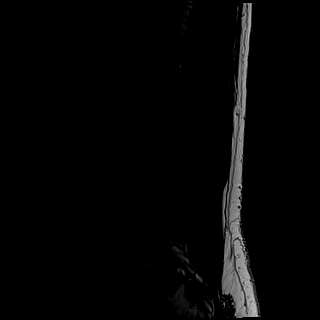
[im 6/17]
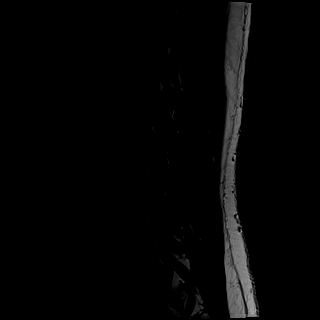
[im 11/17]
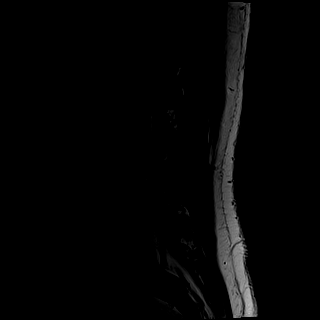
[im 17/17]
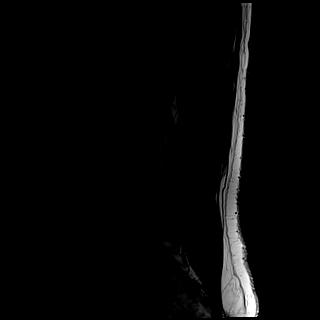

[Series 4: STIR · sagittal · 4.0mm · 1.02mm/px · 4 of 17 slices shown]
[im 1/17]
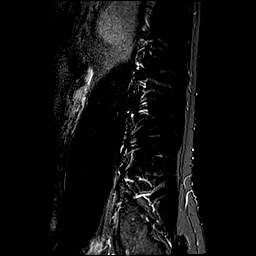
[im 6/17]
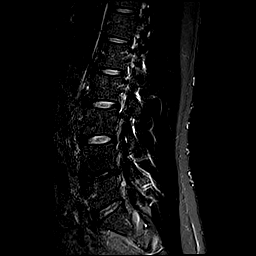
[im 11/17]
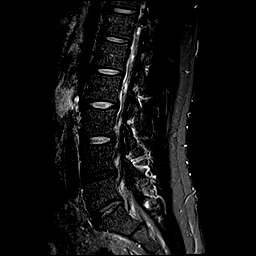
[im 17/17]
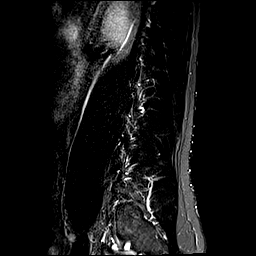

[Series 5: T2 · axial · 4.0mm · 0.78mm/px · z∈[-107,+129]mm · 8 of 38 slices shown (2 of 2)]
[im 1/38]
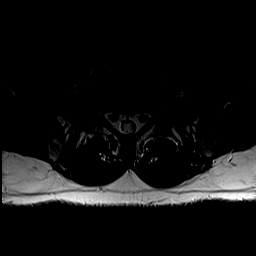
[im 6/38]
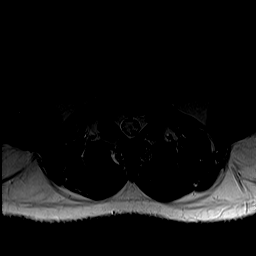
[im 11/38]
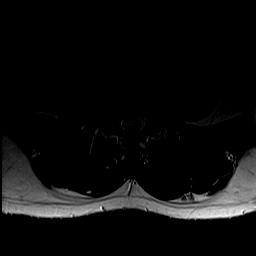
[im 16/38]
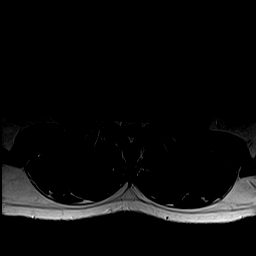
[im 22/38]
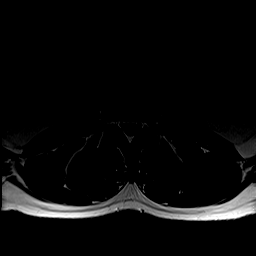
[im 27/38]
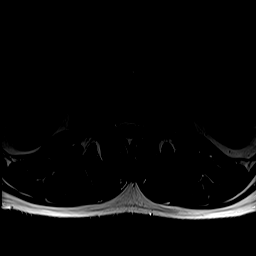
[im 32/38]
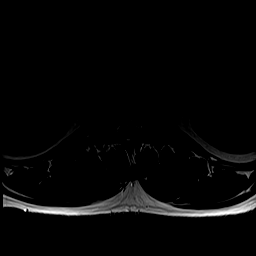
[im 38/38]
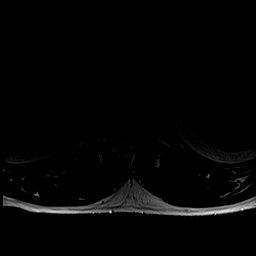

[Series 6: T1 · axial · 4.0mm · 0.39mm/px · z∈[-107,+129]mm · 8 of 38 slices shown (2 of 2)]
[im 1/38]
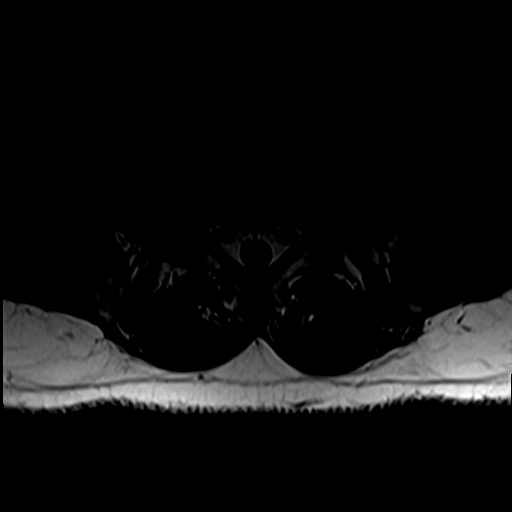
[im 6/38]
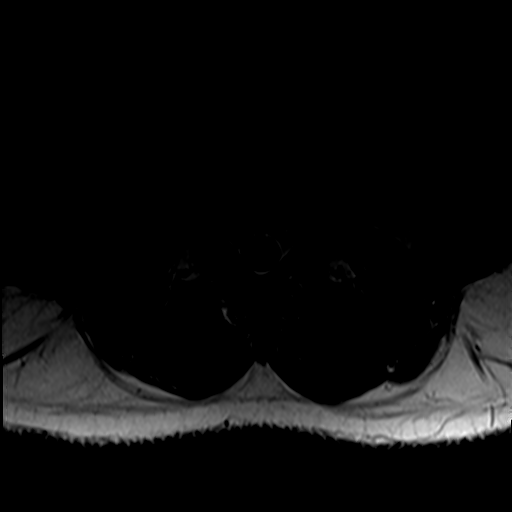
[im 11/38]
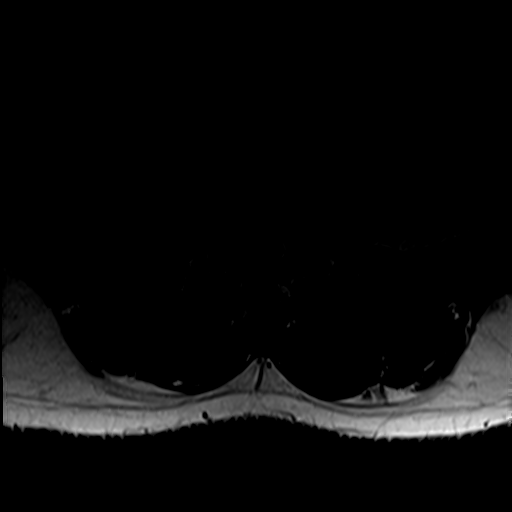
[im 16/38]
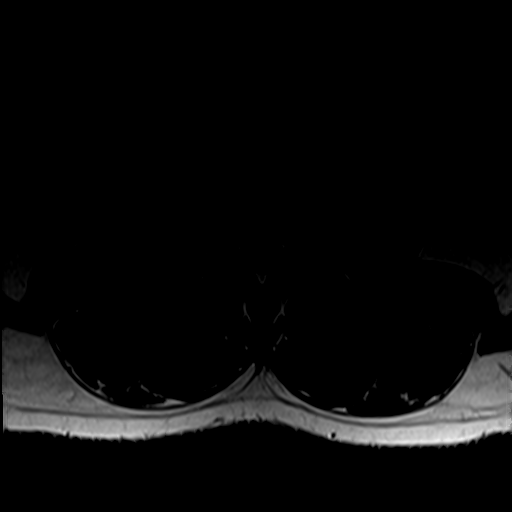
[im 22/38]
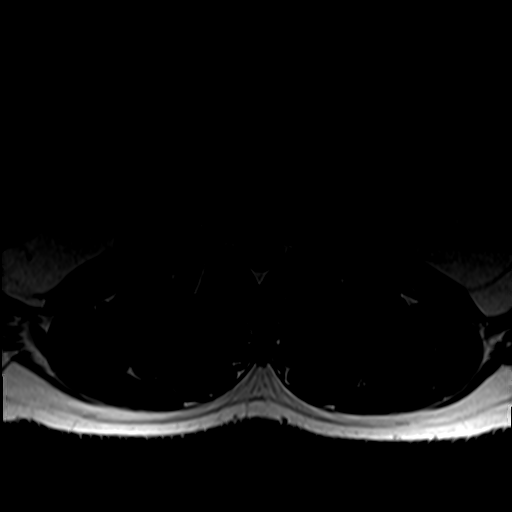
[im 27/38]
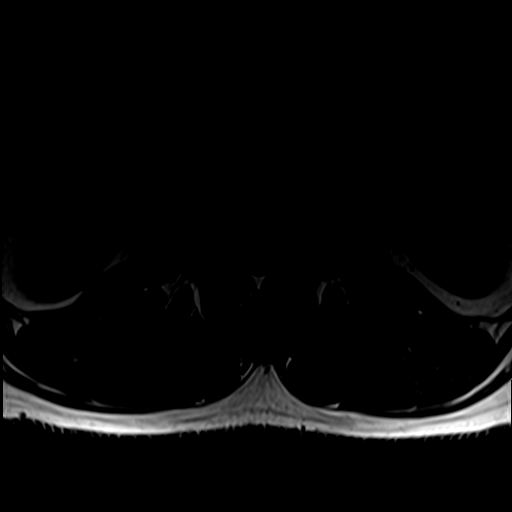
[im 32/38]
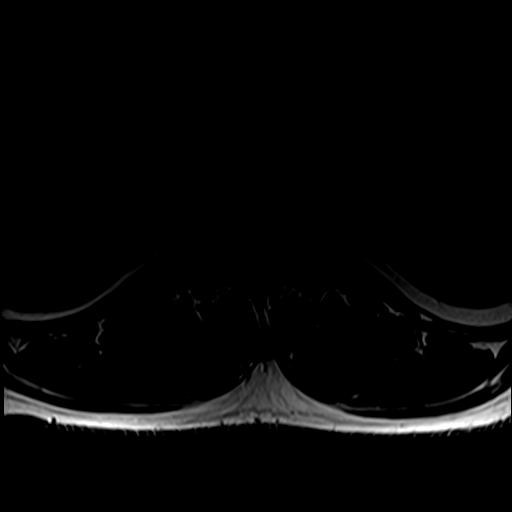
[im 38/38]
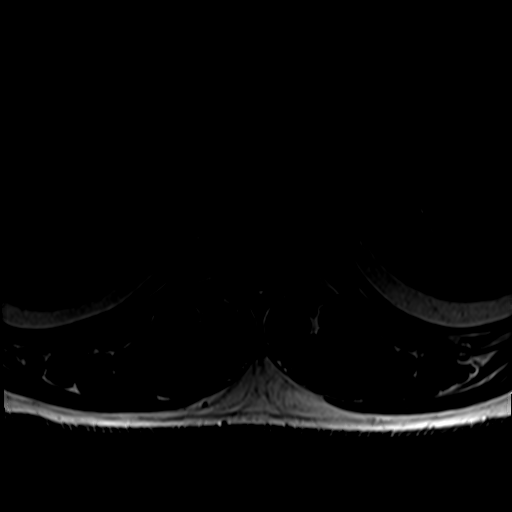

[Series 8: T1 fat-sat post-contrast · sagittal · 4.0mm · 0.81mm/px · 4 of 17 slices shown]
[im 1/17]
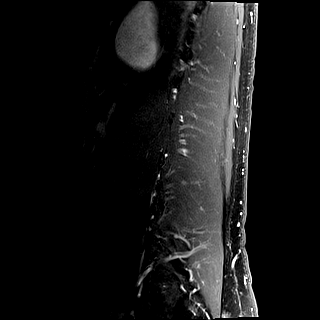
[im 6/17]
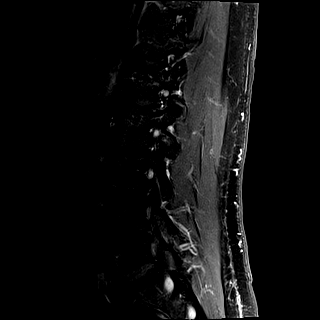
[im 11/17]
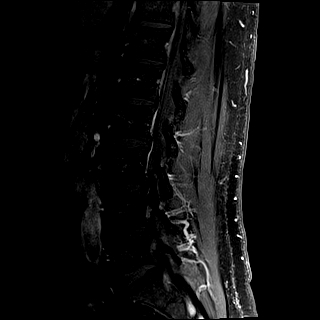
[im 17/17]
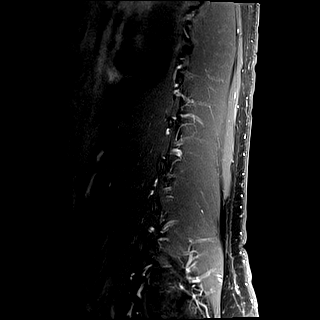

[Series 9: T1 post-contrast · axial · 4.0mm · 0.39mm/px · 1 of 39 slices shown]
[im 1/39]
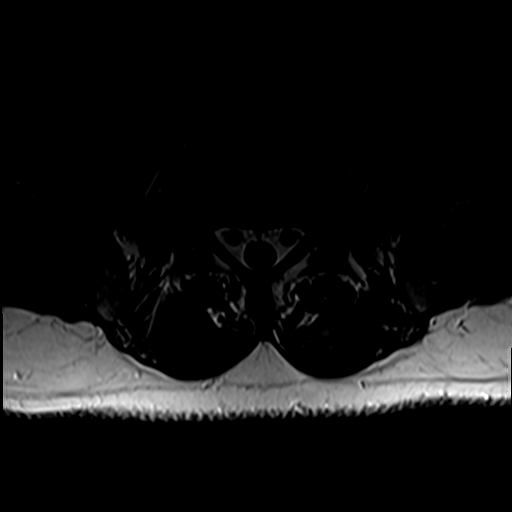

[33 of 48 positions shown; findings below may reference images not displayed]

FINDINGS: MRI THORACIC SPINE FINDINGS

Alignment: Vertebral bodies normally aligned with preservation of
the normal thoracic kyphosis. No listhesis or malalignment.

Vertebrae: There is abnormal T1 hypo intense, T2/STIR hyperintense
signal intensity with postcontrast enhancement involving the T6 and
T7 vertebral bodies, most consistent with osteomyelitis discitis.
Abnormal edema and enhancement within the T6-7 disc space. There is
disc space height loss at T6-7 with scattered endplate irregularity.
No associated pathologic fracture. Abnormal epidural enhancement
within the ventral epidural space at T5-6 through T7-8 without frank
abscess. Abnormal edema with enhancing phlegmonous change within the
adjacent paraspinous soft tissues. No discrete soft tissue abscess
or drainable fluid collection identified.

Remainder of the vertebral bodies are normal in appearance with
normal signal intensity.

Cord: Signal intensity within the thoracic spinal cord is normal.
Mild canal stenosis at the level of T6-7 related to the adjacent
inflammatory process. No other significant canal narrowing.

The

Disc levels:

Note made of a few small degenerative disc bulge nodes within the
thoracic spine, most notable at the T9 level. No significant
stenosis.

MRI LUMBAR SPINE FINDINGS

Segmentation: Normal segmentation. Lowest well-formed disc is
labeled the L5-S1 level.

Alignment: Vertebral bodies normally aligned with preservation of
the normal lumbar lordosis. No listhesis.

Vertebrae: Vertebral body heights maintained. No evidence for acute
or chronic fracture. Signal

intensity within the vertebral body bone marrow is normal. No
findings to suggest acute infection within the lumbar spine.

Conus medullaris: Extends to the L1-2 level and appears normal.

Paraspinal and other soft tissues: Paraspinous soft tissues
demonstrate no acute abnormality.

Disc levels:

L1-2:  Negative.

L2-3:  Negative.

L3-4: No disc bulge or disc protrusion. Mild bilateral facet
arthrosis. No significant stenosis.

L4-5: Broad posterior central disc protrusion with slight caudad
angulation. Associated annular fissure. Resultant mild subarticular
stenosis without frank neural impingement. Mild bilateral foraminal
narrowing, slightly worse on the left.

L5-S1:  Negative.
IMPRESSION: MRI THORACIC SPINE IMPRESSION:

1. Findings consistent with osteomyelitis discitis centered at the
T6-7 level.
2. Associated epidural enhancement/phlegmon extending from T5-6
through T7 without discrete epidural abscess. Associated mild canal
stenosis.
3. Associated paraspinous phlegmon at the T6-7 level. No discrete
abscess or drainable fluid collection identified.
MRI LUMBAR SPINE IMPRESSION:

1. No MRI evidence for acute infection within the lumbar spine.
2. Central disc protrusion at L4-5 without significant stenosis or
evidence for neural impingement.
3. Mild facet arthropathy at L3-4.

## 2017-12-05 IMAGING — CT CT CHEST W/ CM
2 of 3 series · 15 of 36 positions shown, 18 images · IV contrast (isovue)
Comparison: None.

CLINICAL DATA: 45 y/o M; severe pain in the right rib cage
radiating around to the back.

EXAM:
CT CHEST WITH CONTRAST
TECHNIQUE: Multidetector CT imaging of the chest was performed during
intravenous contrast administration.
CONTRAST:  75 cc Isovue 370

[Series 2: axial st · axial · 0.79mm/px · z∈[-415,-185]mm · 12 of 137 slices shown, 15 images]
[im 11/137  mediastinal]
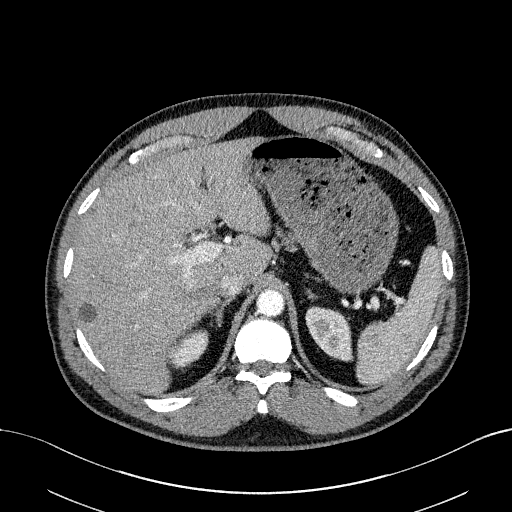
[im 11/137  lung]
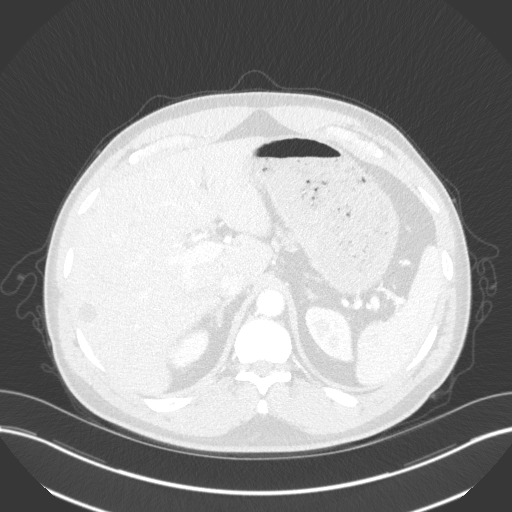
[im 21/137  lung]
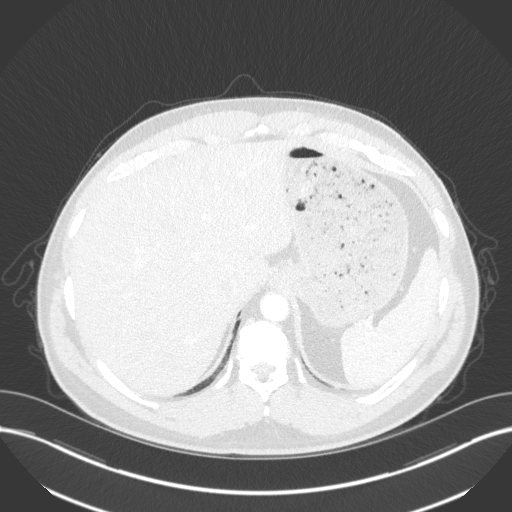
[im 31/137  lung]
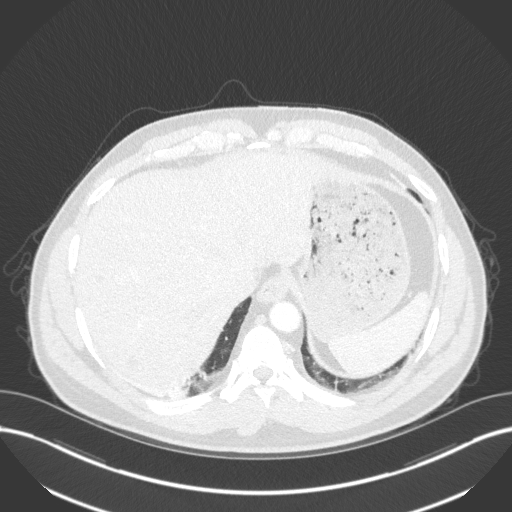
[im 41/137  lung]
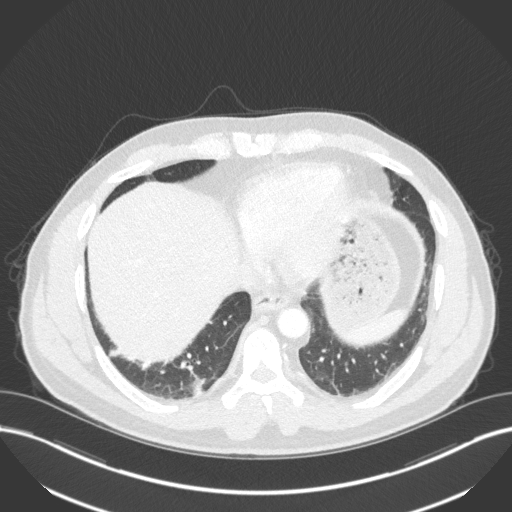
[im 51/137  mediastinal]
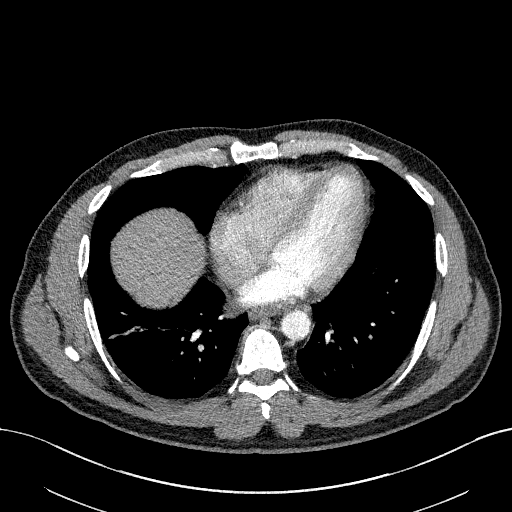
[im 51/137  lung]
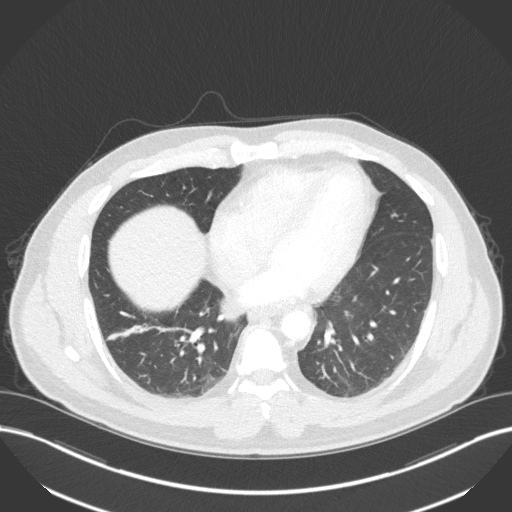
[im 61/137  lung]
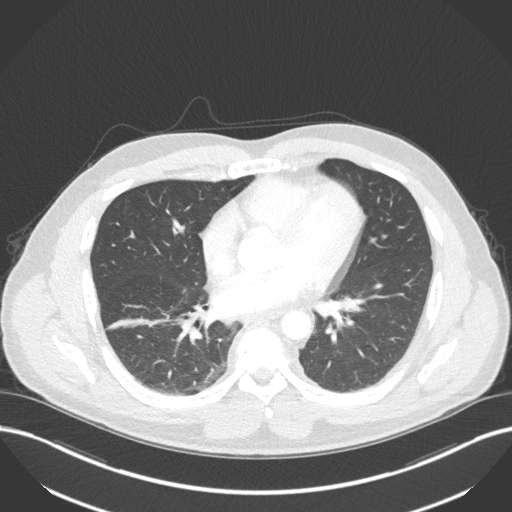
[im 76/137  lung]
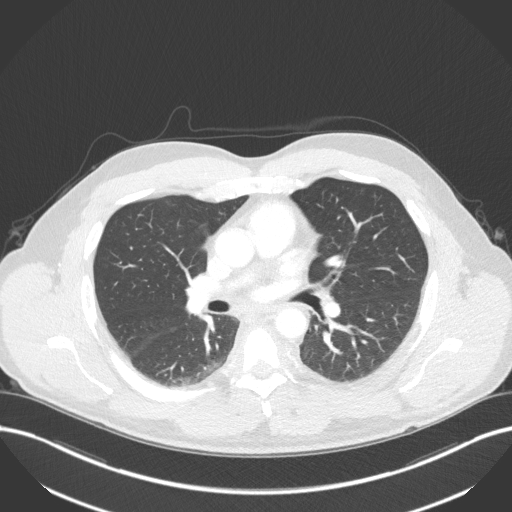
[im 86/137  lung]
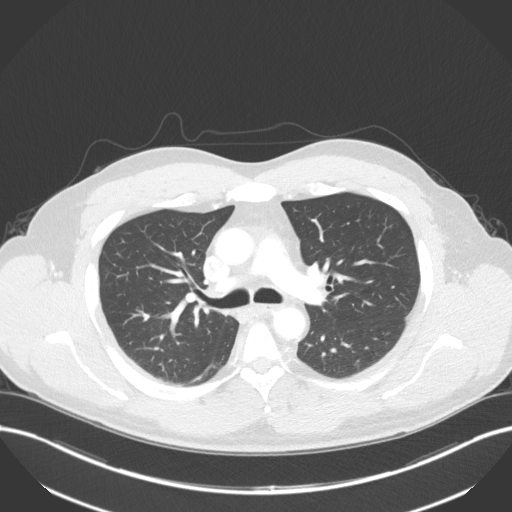
[im 96/137  mediastinal]
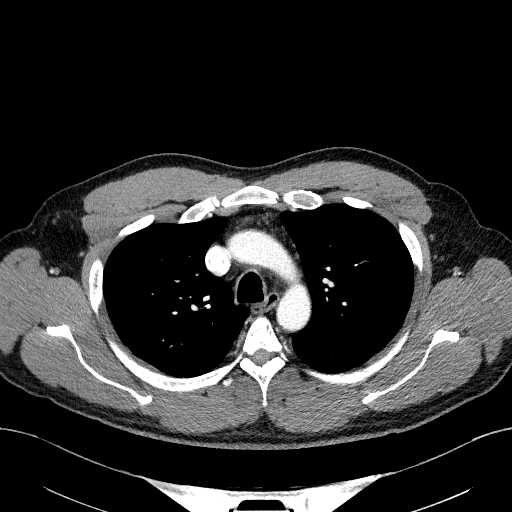
[im 96/137  lung]
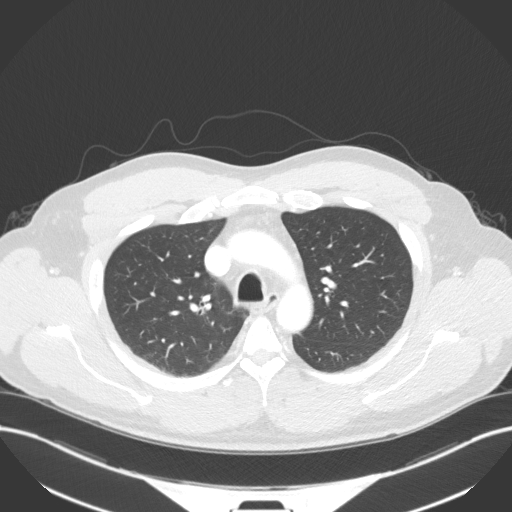
[im 106/137  lung]
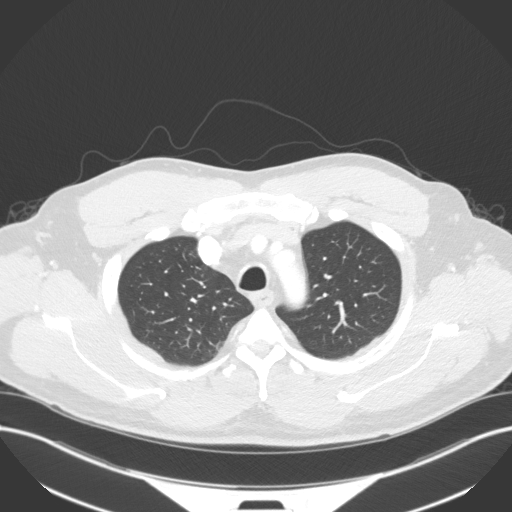
[im 116/137  lung]
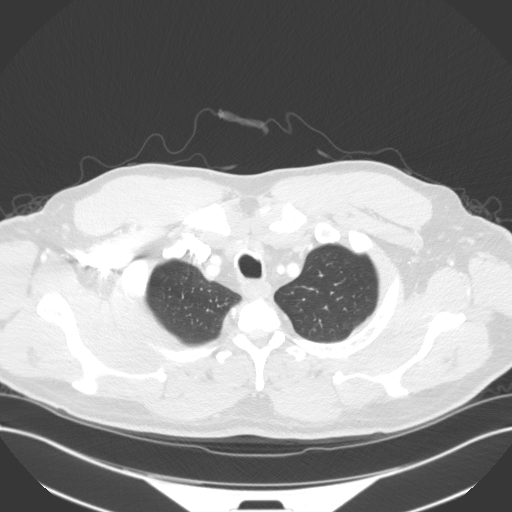
[im 126/137  lung]
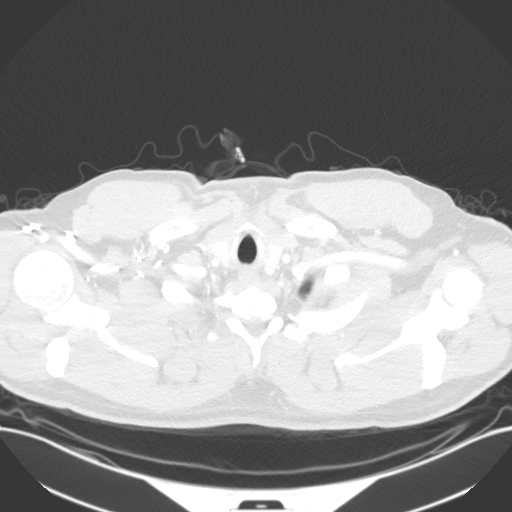

[Series 5: coronal · coronal · 0.54mm/px · 3 of 142 slices shown]
[im 29/142  lung]
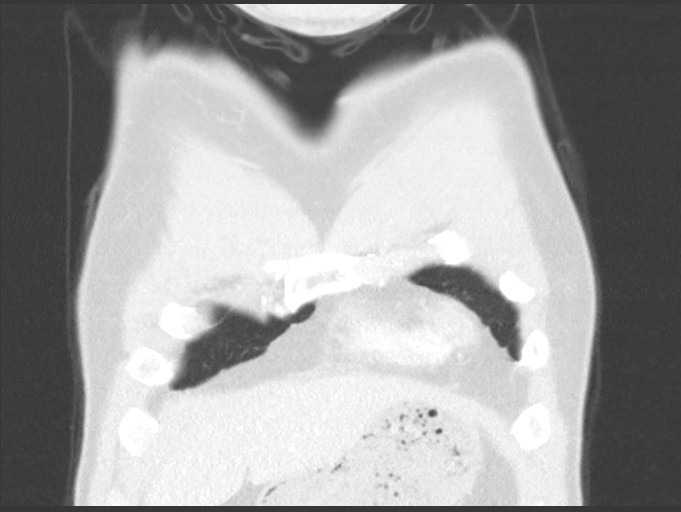
[im 57/142  lung]
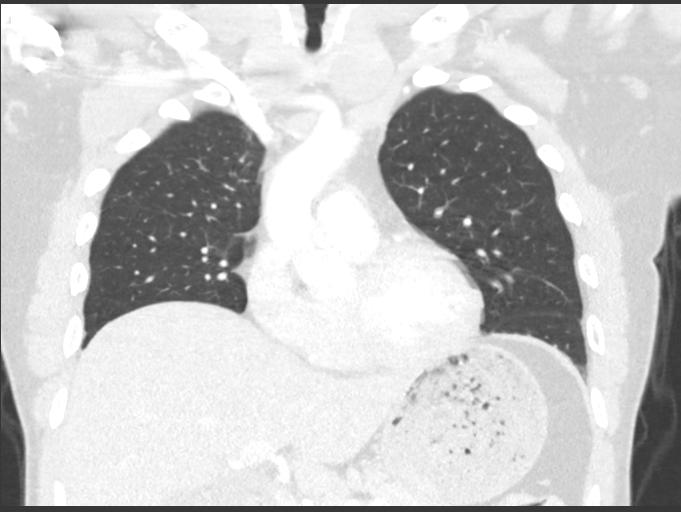
[im 85/142  lung]
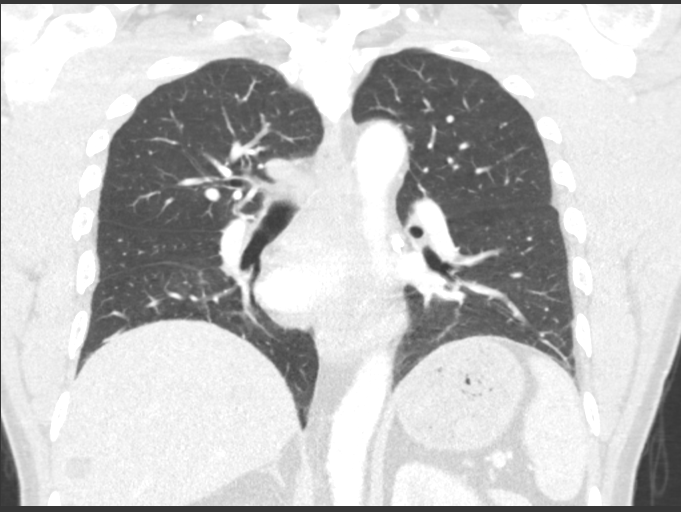

[15 of 36 positions shown; findings below may reference images not displayed]

FINDINGS: Cardiovascular: No significant vascular findings. Normal heart size.
No pericardial effusion.

Mediastinum/Nodes: No enlarged mediastinal, hilar, or axillary lymph
nodes. Thyroid gland, trachea, and esophagus demonstrate no
significant findings.

Lungs/Pleura: Lungs are clear. No pleural effusion or pneumothorax.

Upper Abdomen: Multiple fluid attenuating well-circumscribed foci
within the liver measuring up to 13 mm in segment VII as well as
several additional smaller foci that are too small to characterize
probably representing cysts.

Musculoskeletal: Destructive lesion centered within T6-7 disc and
endplates (series 6, image 99) and paravertebral surrounding soft
tissue thickening from T5-T7 ([DATE] and [DATE]).
IMPRESSION: Destructive lesion centered within the T6-7 disc and endplates with
paravertebral soft tissue thickening. In the absence of primary
malignancy this probably represents discitis osteomyelitis. Thoracic
MRI with contrast is recommended to evaluate for epidural disease.

These results were called by telephone at the time of interpretation
on 09/11/2016 at [DATE] to Dr. ABDIAZIZ FRED , who verbally
acknowledged these results.

By: Klever Jumper M.D.

## 2018-08-20 ENCOUNTER — Encounter: Payer: Self-pay | Admitting: Behavioral Health

## 2018-09-02 ENCOUNTER — Ambulatory Visit: Payer: Self-pay | Admitting: Psychiatry

## 2018-09-26 ENCOUNTER — Other Ambulatory Visit: Payer: Self-pay | Admitting: Psychiatry

## 2018-10-02 ENCOUNTER — Other Ambulatory Visit: Payer: Self-pay | Admitting: Psychiatry

## 2018-10-03 ENCOUNTER — Other Ambulatory Visit: Payer: Self-pay | Admitting: Psychiatry

## 2018-10-05 ENCOUNTER — Encounter: Payer: Self-pay | Admitting: Psychiatry

## 2018-10-05 ENCOUNTER — Ambulatory Visit: Payer: Managed Care, Other (non HMO) | Admitting: Psychiatry

## 2018-10-05 DIAGNOSIS — F411 Generalized anxiety disorder: Secondary | ICD-10-CM

## 2018-10-05 DIAGNOSIS — G251 Drug-induced tremor: Secondary | ICD-10-CM

## 2018-10-05 DIAGNOSIS — Z79899 Other long term (current) drug therapy: Secondary | ICD-10-CM

## 2018-10-05 DIAGNOSIS — G4733 Obstructive sleep apnea (adult) (pediatric): Secondary | ICD-10-CM | POA: Diagnosis not present

## 2018-10-05 DIAGNOSIS — F319 Bipolar disorder, unspecified: Secondary | ICD-10-CM | POA: Diagnosis not present

## 2018-10-05 MED ORDER — LAMOTRIGINE 150 MG PO TABS: 150.0000 mg | ORAL_TABLET | Freq: Two times a day (BID) | ORAL | 0 refills | Status: DC

## 2018-10-05 MED ORDER — LAMOTRIGINE 150 MG PO TABS: 150.0000 mg | ORAL_TABLET | Freq: Two times a day (BID) | ORAL | 1 refills | Status: DC

## 2018-10-05 NOTE — Progress Notes (Signed)
Michael Mclean [id number] [birth year] 47 y.o.  Subjective:   Patient ID:  Michael Mclean is a 47 y.o. (DOB 12-16-1970) male.  Chief Complaint:  Chief Complaint  Patient presents with  . Follow-up    bipolar mixed  . Anxiety    HPI Seen with fiancee' at their request. Michael Mclean presents to the office today for follow-up of a lot of stress since here.  Lithium has helped manage with less anger and impulsivity.  Pleased with the response.  Did not have this level of control before.  Wants to try to get on less meds and his fiancee' is suggesting it.  Feels guilty about being dependent on the meds.  He sees benefit.    Review of Systems:  Review of Systems  Neurological: Positive for tremors. Negative for weakness.  Psychiatric/Behavioral: Negative for agitation, behavioral problems, confusion, decreased concentration, dysphoric mood, hallucinations, self-injury, sleep disturbance and suicidal ideas. The patient is not nervous/anxious and is not hyperactive.     Medications: I have reviewed the patient's current medications.  Current Outpatient Medications  Medication Sig Dispense Refill  . ALPRAZolam (XANAX) 0.5 MG tablet Take 0.5 mg by mouth 3 (three) times daily as needed.     . Armodafinil (NUVIGIL) 250 MG tablet Take 250 mg by mouth 2 (two) times daily.     Marland Kitchen atorvastatin (LIPITOR) 40 MG tablet Take 40 mg by mouth daily.    . Eszopiclone 3 MG TABS Take 3 mg by mouth at bedtime. Take immediately before bedtime    . ibuprofen (ADVIL,MOTRIN) 400 MG tablet Take 1 tablet (400 mg total) by mouth every 6 (six) hours as needed for moderate pain. 12 tablet 0  . lamoTRIgine (LAMICTAL) 150 MG tablet Take 150 mg by mouth 2 (two) times daily.     Marland Kitchen lithium carbonate 300 MG capsule TAKE 4 CAPSULES AT BEDTIME (Patient taking differently: Take 900 mg by mouth at bedtime. ) 360 capsule 1  . QUEtiapine (SEROQUEL XR) 400 MG 24 hr tablet TAKE 2 TABLETS EVERY EVENING 180 tablet 0  . topiramate  (TOPAMAX) 100 MG tablet Take 1 tablet by mouth every morning.    . valACYclovir (VALTREX) 500 MG tablet Take 500 mg by mouth. Takes for 5 days    . oxyCODONE (ROXICODONE) 5 MG immediate release tablet Take 1 tablet (5 mg total) by mouth every 6 (six) hours as needed for severe pain. Do not drive while taking this medication. (Patient not taking: Reported on 10/05/2018) 20 tablet 0   No current facility-administered medications for this visit.     Medication Side Effects: None  Allergies: No Known Allergies  Past Medical History:  Diagnosis Date  . Anxiety disorder   . Bipolar 1 disorder (HCC)   . Hyperlipidemia   . Hypertension     History reviewed. No pertinent family history.  Social History   Socioeconomic History  . Marital status: Single    Spouse name: Not on file  . Number of children: Not on file  . Years of education: Not on file  . Highest education level: Not on file  Occupational History  . Occupation: works for [employer] for [employer]  Social Needs  . Financial resource strain: Not on file  . Food insecurity:    Worry: Not on file    Inability: Not on file  . Transportation needs:    Medical: Not on file    Non-medical: Not on file  Tobacco Use  . Smoking status: Never  Smoker  . Smokeless tobacco: Never Used  Substance and Sexual Activity  . Alcohol use: Yes  . Drug use: No  . Sexual activity: Not on file  Lifestyle  . Physical activity:    Days per week: Not on file    Minutes per session: Not on file  . Stress: Not on file  Relationships  . Social connections:    Talks on phone: Not on file    Gets together: Not on file    Attends religious service: Not on file    Active member of club or organization: Not on file    Attends meetings of clubs or organizations: Not on file    Relationship status: Not on file  . Intimate partner violence:    Fear of current or ex partner: Not on file    Emotionally abused: Not on file     Physically abused: Not on file    Forced sexual activity: Not on file  Other Topics Concern  . Not on file  Social History Narrative  . Not on file    Past Medical History, Surgical history, Social history, and Family history were reviewed and updated as appropriate.   Please see review of systems for further details on the patient's review from today.   Objective:   Physical Exam:  There were no vitals taken for this visit.  Physical Exam  Constitutional: He is oriented to person, place, and time. He appears well-developed. No distress.  Musculoskeletal: He exhibits no deformity.  Neurological: He is alert and oriented to person, place, and time. He displays no tremor. Coordination and gait normal.  Psychiatric: He has a normal mood and affect. His speech is normal and behavior is normal. Judgment and thought content normal. His mood appears not anxious. His affect is not angry, not blunt, not labile and not inappropriate. Cognition and memory are normal. He does not exhibit a depressed mood. He expresses no homicidal and no suicidal ideation. He expresses no suicidal plans and no homicidal plans.  Insight and judgment fair. No auditory or visual hallucinations.   He is attentive.    Lab Review:     Component Value Date/Time   NA 141 09/15/2016 0500   K 3.7 09/15/2016 0500   CL 115 (H) 09/15/2016 0500   CO2 22 09/15/2016 0500   GLUCOSE 110 (H) 09/15/2016 0500   BUN 13 09/15/2016 0500   CREATININE 0.98 09/15/2016 0500   CALCIUM 8.6 (L) 09/15/2016 0500   PROT 8.7 (H) 07/20/2015 1129   ALBUMIN 4.9 07/20/2015 1129   AST 37 07/20/2015 1129   ALT 51 07/20/2015 1129   ALKPHOS 79 07/20/2015 1129   BILITOT 0.6 07/20/2015 1129   GFRNONAA >60 09/15/2016 0500   GFRAA >60 09/15/2016 0500       Component Value Date/Time   WBC 3.4 (L) 09/15/2016 0500   RBC 4.37 (L) 09/15/2016 0500   HGB 13.3 09/15/2016 0500   HCT 38.7 (L) 09/15/2016 0500   PLT 242 09/15/2016 0500   MCV 88.6  09/15/2016 0500   MCH 30.4 09/15/2016 0500   MCHC 34.3 09/15/2016 0500   RDW 13.1 09/15/2016 0500   LYMPHSABS 2.3 07/20/2015 1129   MONOABS 0.2 07/20/2015 1129   EOSABS 0.1 07/20/2015 1129   BASOSABS 0.0 07/20/2015 1129    No results found for: POCLITH, LITHIUM   No results found for: PHENYTOIN, PHENOBARB, VALPROATE, CBMZ   .res Assessment: Plan:    Bipolar I disorder (HCC)  Lithium-induced tremor  Generalized anxiety disorder  Obstructive sleep apnea   Greater than 50% of face to face time with patient was spent on counseling and coordination of care. We discussed his diagnoses and answered questions re: bipolar dx, meds, SE. he has received additional parent benefit and mood consistency with the addition of the lithium.  He is tolerating in the dosage at 900 mg a day but did not tolerate 1200 mg a day very well.  The lithium tremor has been reduced over time.  Counseled patient regarding potential benefits, risks, and side effects of Lamictal to include potential risk of Stevens-Johnson syndrome. Advised patient to stop taking Lamictal and contact office immediately if rash develops and to seek urgent medical attention if rash is severe and/or spreading quickly.  Also emphasized the need for conisistency with lamotrigine dosing otherwise there is an increased risk of rash.  Wean topiramate over 2 weeks to minimize meds.  This appt was 30 mins.  Serum lithium level, TSH, BMP.   FU 3 mos  Meredith Staggersarey Cottle, MD, DFAPA   Please see After Visit Summary for patient specific instructions.  No future appointments.  No orders of the defined types were placed in this encounter.     -------------------------------

## 2018-10-05 NOTE — Patient Instructions (Addendum)
1/2 topiramate tablet daily for 2 weeks and then stop it.  Get blood test ideally about 12 hours after the last dosage of lithium.

## 2018-10-08 ENCOUNTER — Other Ambulatory Visit: Payer: Self-pay

## 2018-11-01 ENCOUNTER — Other Ambulatory Visit: Payer: Self-pay | Admitting: Psychiatry

## 2018-11-02 NOTE — Telephone Encounter (Signed)
Ask pt if [facility]

## 2018-11-02 NOTE — Telephone Encounter (Signed)
[facility]

## 2018-11-12 ENCOUNTER — Other Ambulatory Visit: Payer: Self-pay

## 2018-11-12 MED ORDER — LAMOTRIGINE 150 MG PO TABS: 150.0000 mg | ORAL_TABLET | Freq: Two times a day (BID) | ORAL | 2 refills | Status: DC

## 2018-12-26 ENCOUNTER — Other Ambulatory Visit: Payer: Self-pay | Admitting: Psychiatry

## 2018-12-30 ENCOUNTER — Ambulatory Visit: Payer: Managed Care, Other (non HMO) | Admitting: Psychiatry

## 2019-02-08 ENCOUNTER — Other Ambulatory Visit: Payer: Self-pay

## 2019-02-08 MED ORDER — ALPRAZOLAM 0.5 MG PO TABS: ORAL_TABLET | ORAL | 1 refills | Status: DC

## 2019-02-08 MED ORDER — ESZOPICLONE 3 MG PO TABS: 3.0000 mg | ORAL_TABLET | Freq: Every day | ORAL | 1 refills | Status: DC

## 2019-02-08 MED ORDER — ARMODAFINIL 250 MG PO TABS: 250.0000 mg | ORAL_TABLET | Freq: Two times a day (BID) | ORAL | 1 refills | Status: DC

## 2019-03-14 ENCOUNTER — Encounter: Payer: Self-pay | Admitting: Psychiatry

## 2019-03-14 ENCOUNTER — Other Ambulatory Visit: Payer: Self-pay

## 2019-03-14 ENCOUNTER — Ambulatory Visit (INDEPENDENT_AMBULATORY_CARE_PROVIDER_SITE_OTHER): Payer: Managed Care, Other (non HMO) | Admitting: Psychiatry

## 2019-03-14 DIAGNOSIS — F411 Generalized anxiety disorder: Secondary | ICD-10-CM

## 2019-03-14 DIAGNOSIS — G251 Drug-induced tremor: Secondary | ICD-10-CM | POA: Diagnosis not present

## 2019-03-14 DIAGNOSIS — F319 Bipolar disorder, unspecified: Secondary | ICD-10-CM

## 2019-03-14 DIAGNOSIS — G4733 Obstructive sleep apnea (adult) (pediatric): Secondary | ICD-10-CM

## 2019-03-14 NOTE — Progress Notes (Signed)
Michael Mclean [id number] 12-14-1970 48 y.o.  Virtual Visit via Telephone Note  I connected with pt by telephone and verified that I am speaking with the correct person using two identifiers.   I discussed the limitations, risks, security and privacy concerns of performing an evaluation and management service by telephone and the availability of in person appointments. I also discussed with the patient that there may be a patient responsible charge related to this service. The patient expressed understanding and agreed to proceed.  I discussed the assessment and treatment plan with the patient. The patient was provided an opportunity to ask questions and all were answered. The patient agreed with the plan and demonstrated an understanding of the instructions.   The patient was advised to call back or seek an in-person evaluation if the symptoms worsen or if the condition fails to improve as anticipated.  I provided 22 minutes of non-face-to-face time during this encounter. The call started at 258 and ended at 320. The patient was located at home and the provider was located off.   Subjective:   Patient ID:  Michael Mclean is a 48 y.o. (DOB 05-19-1971) male.  Chief Complaint:  Chief Complaint  Patient presents with  . Follow-up    Medication Management  . Depression    Medication Management- Mother passed in 12/09/22.    Depression         Associated symptoms include no decreased concentration and no suicidal ideas.  Michael Mclean is due for follow-up of bipolar disorder.  The last visit was in November and we weaned topiramate.  No other med changes made.  M died in 2022/12/09 and that was hard bc they had to pull the plug as a family.  Handled it ok now.  Took FMLA a week.  He reduced the lithium back to 900 mg bc felt like it was all that was necessary.  A little scared to increase the lithium bc hand jerks at times.  Some tremor.  Scares him.  Little anger an impulsivity at this  time but is a little.  Notices he can't drink much with the lithiium.  When drinks he does too much and may not sleep and then has a really negative effect on him.  Patient reports stable mood and denies depressed or irritable moods usually.  Patient denies any recent difficulty with anxiety.  Patient denies difficulty with sleep initiation or maintenance. Denies appetite disturbance.  Patient reports that energy and motivation have been good.  Patient denies any difficulty with concentration.  Patient denies any suicidal ideation.   Lithium has helped manage with less anger and impulsivity.  Pleased with the response.  Did not have this level of control before.  Wants to try to get on less meds and his fiancee' is suggesting it.  Feels guilty about being dependent on the meds.  He sees benefit.  Michael Mclean says he's well.    Past Psychiatric Medication Trials:  Depakote paranoia, Nuvigil, topiramate, Seroquel 800, Wellbutrin, lamotrigine, Xanax, Sonata, viiibryd, trazodone 400 Abilify 20 amitriptyline Ambien   Review of Systems:  Review of Systems  Neurological: Positive for tremors. Negative for weakness.  Psychiatric/Behavioral: Positive for depression. Negative for agitation, behavioral problems, confusion, decreased concentration, dysphoric mood, hallucinations, self-injury, sleep disturbance and suicidal ideas. The patient is not nervous/anxious and is not hyperactive.     Medications: I have reviewed the patient's current medications.  Current Outpatient Medications  Medication Sig Dispense Refill  . ALPRAZolam (XANAX) 0.5 MG tablet Take  1 tablet (0.5 mg) three times daily, as needed. 270 tablet 1  . Armodafinil (NUVIGIL) 250 MG tablet Take 1 tablet (250 mg total) by mouth 2 (two) times daily. 120 tablet 1  . atorvastatin (LIPITOR) 40 MG tablet Take 40 mg by mouth daily.    . Eszopiclone 3 MG TABS Take 1 tablet (3 mg total) by mouth at bedtime. Take immediately before bedtime 90 tablet  1  . ibuprofen (ADVIL,MOTRIN) 400 MG tablet Take 1 tablet (400 mg total) by mouth every 6 (six) hours as needed for moderate pain. 12 tablet 0  . lamoTRIgine (LAMICTAL) 150 MG tablet Take 1 tablet (150 mg total) by mouth 2 (two) times daily. 60 tablet 2  . lithium carbonate 300 MG capsule TAKE 4 CAPSULES AT BEDTIME (Patient taking differently: Take 900 mg by mouth at bedtime. ) 360 capsule 1  . QUEtiapine (SEROQUEL XR) 400 MG 24 hr tablet TAKE 2 TABLETS EVERY EVENING 180 tablet 1  . valACYclovir (VALTREX) 500 MG tablet Take 500 mg by mouth. Takes for 5 days     No current facility-administered medications for this visit.     Medication Side Effects: None  Allergies: No Known Allergies  Past Medical History:  Diagnosis Date  . Anxiety disorder   . Bipolar 1 disorder (HCC)   . Hyperlipidemia   . Hypertension     History reviewed. No pertinent family history.  Social History   Socioeconomic History  . Marital status: Single    Spouse name: Not on file  . Number of children: Not on file  . Years of education: Not on file  . Highest education level: Not on file  Occupational History  . Occupation: works for [employer] for [employer]  Social Needs  . Financial resource strain: Not on file  . Food insecurity:    Worry: Not on file    Inability: Not on file  . Transportation needs:    Medical: Not on file    Non-medical: Not on file  Tobacco Use  . Smoking status: Never Smoker  . Smokeless tobacco: Never Used  Substance and Sexual Activity  . Alcohol use: Yes  . Drug use: No  . Sexual activity: Not on file  Lifestyle  . Physical activity:    Days per week: Not on file    Minutes per session: Not on file  . Stress: Not on file  Relationships  . Social connections:    Talks on phone: Not on file    Gets together: Not on file    Attends religious service: Not on file    Active member of club or organization: Not on file    Attends meetings of clubs  or organizations: Not on file    Relationship status: Not on file  . Intimate partner violence:    Fear of current or ex partner: Not on file    Emotionally abused: Not on file    Physically abused: Not on file    Forced sexual activity: Not on file  Other Topics Concern  . Not on file  Social History Narrative  . Not on file    Past Medical History, Surgical history, Social history, and Family history were reviewed and updated as appropriate.   Please see review of systems for further details on the patient's review from today.   Objective:   Physical Exam:  There were no vitals taken for this visit.  Physical Exam Neurological:     Mental Status:  He is alert and oriented to person, place, and time.     Cranial Nerves: No dysarthria.  Psychiatric:        Attention and Perception: Attention normal.        Mood and Affect: Mood is not anxious or depressed.        Speech: Speech normal.        Behavior: Behavior is cooperative.        Thought Content: Thought content normal. Thought content is not paranoid or delusional. Thought content does not include homicidal or suicidal ideation. Thought content does not include homicidal or suicidal plan.        Cognition and Memory: Cognition and memory normal.        Judgment: Judgment normal.     Comments: Anxiety and irritability and depression generally are better than they have been.     Lab Review:     Component Value Date/Time   NA 141 09/15/2016 0500   K 3.7 09/15/2016 0500   CL 115 (H) 09/15/2016 0500   CO2 22 09/15/2016 0500   GLUCOSE 110 (H) 09/15/2016 0500   BUN 13 09/15/2016 0500   CREATININE 0.98 09/15/2016 0500   CALCIUM 8.6 (L) 09/15/2016 0500   PROT 8.7 (H) 07/20/2015 1129   ALBUMIN 4.9 07/20/2015 1129   AST 37 07/20/2015 1129   ALT 51 07/20/2015 1129   ALKPHOS 79 07/20/2015 1129   BILITOT 0.6 07/20/2015 1129   GFRNONAA >60 09/15/2016 0500   GFRAA >60 09/15/2016 0500       Component Value Date/Time    WBC 3.4 (L) 09/15/2016 0500   RBC 4.37 (L) 09/15/2016 0500   HGB 13.3 09/15/2016 0500   HCT 38.7 (L) 09/15/2016 0500   PLT 242 09/15/2016 0500   MCV 88.6 09/15/2016 0500   MCH 30.4 09/15/2016 0500   MCHC 34.3 09/15/2016 0500   RDW 13.1 09/15/2016 0500   LYMPHSABS 2.3 07/20/2015 1129   MONOABS 0.2 07/20/2015 1129   EOSABS 0.1 07/20/2015 1129   BASOSABS 0.0 07/20/2015 1129    No results found for: POCLITH, LITHIUM   No results found for: PHENYTOIN, PHENOBARB, VALPROATE, CBMZ   .res Assessment: Plan:    Bipolar I disorder (HCC)  Lithium-induced tremor  Generalized anxiety disorder  Obstructive sleep apnea   Greater than 50% of face to face time with patient was spent on counseling and coordination of care. We discussed his diagnoses and answered questions re: bipolar dx, meds, SE. he has received additional parent benefit and mood consistency with the addition of the lithium.  He is tolerating in the dosage at 900 mg a day but did not tolerate 1200 mg a day very well.  The lithium tremor has been reduced over time.  If he has further problems with depression and irritability we could increase the dose to 1050 instead of 1200.  Discussed lithium jerks and tremors and reassured him that they are not permanent and are not associated with tardive dyskinesia.  Discussed potential metabolic side effects associated with atypical antipsychotics, as well as potential risk for movement side effects. Advised pt to contact office if movement side effects occur.   Counseled patient regarding potential benefits, risks, and side effects of Lamictal to include potential risk of Stevens-Johnson syndrome. Advised patient to stop taking Lamictal and contact office immediately if rash develops and to seek urgent medical attention if rash is severe and/or spreading quickly.  Also emphasized the need for conisistency with lamotrigine dosing otherwise there is  an increased risk of rash.  Watch alcohol.   Disc with him.  Disc alcohol and dehydration and risk lithium toxicity .  He is getting benefit from the new vigil for alertness.  Related to sleep apnea  Counseled patient regarding potential benefits, risks, and side effects of lithium to include potential risk of lithium affecting thyroid and renal function.  Discussed need for periodic lab monitoring to determine drug level and to assess for potential adverse effects.  Counseled patient regarding signs and symptoms of lithium toxicity and advised that they notify office immediately or seek urgent medical attention if experiencing these signs and symptoms.  Patient advised to contact office with any questions or concerns.   Serum lithium level, TSH, BMP.  Check it.  Once the Covid virus is passed  Discussed the risks and in his case necessity of polypharmacy.  He is agreeable to continue the current meds.  FU 3 mos  Meredith Staggers, MD, DFAPA   Please see After Visit Summary for patient specific instructions.  No future appointments.  No orders of the defined types were placed in this encounter.     -------------------------------

## 2019-04-02 ENCOUNTER — Other Ambulatory Visit: Payer: Self-pay | Admitting: Psychiatry

## 2019-06-10 ENCOUNTER — Other Ambulatory Visit: Payer: Self-pay

## 2019-06-10 MED ORDER — LAMOTRIGINE 150 MG PO TABS: 150.0000 mg | ORAL_TABLET | Freq: Two times a day (BID) | ORAL | 0 refills | Status: DC

## 2019-06-10 MED ORDER — ARMODAFINIL 250 MG PO TABS: 250.0000 mg | ORAL_TABLET | Freq: Two times a day (BID) | ORAL | 0 refills | Status: DC

## 2019-06-10 NOTE — Telephone Encounter (Signed)
Call to RS him 30 min

## 2019-06-25 ENCOUNTER — Other Ambulatory Visit: Payer: Self-pay | Admitting: Psychiatry

## 2019-08-11 ENCOUNTER — Other Ambulatory Visit: Payer: Self-pay

## 2019-08-11 ENCOUNTER — Encounter: Payer: Self-pay | Admitting: Psychiatry

## 2019-08-11 ENCOUNTER — Ambulatory Visit (INDEPENDENT_AMBULATORY_CARE_PROVIDER_SITE_OTHER): Payer: 59 | Admitting: Psychiatry

## 2019-08-11 DIAGNOSIS — Z79899 Other long term (current) drug therapy: Secondary | ICD-10-CM

## 2019-08-11 DIAGNOSIS — F411 Generalized anxiety disorder: Secondary | ICD-10-CM | POA: Diagnosis not present

## 2019-08-11 DIAGNOSIS — G251 Drug-induced tremor: Secondary | ICD-10-CM

## 2019-08-11 DIAGNOSIS — F319 Bipolar disorder, unspecified: Secondary | ICD-10-CM | POA: Diagnosis not present

## 2019-08-11 DIAGNOSIS — G4733 Obstructive sleep apnea (adult) (pediatric): Secondary | ICD-10-CM | POA: Diagnosis not present

## 2019-08-11 MED ORDER — QUETIAPINE FUMARATE ER 300 MG PO TB24: 600.0000 mg | ORAL_TABLET | Freq: Every day | ORAL | 0 refills | Status: DC

## 2019-08-11 NOTE — Progress Notes (Signed)
Michael Mclean [id number] [birth year] 48 y.o.    Subjective:   Patient ID:  Michael KetCheron Mclean is a 48 y.o. (DOB [birth year]) male.  Chief Complaint:  Chief Complaint  Patient presents with  . Follow-up    Medication Management  . Anxiety    Medication Management  . Other    Bipolar Affective Disorder    Depression        Associated symptoms include no decreased concentration and no suicidal ideas.  Past medical history includes anxiety.   Anxiety Patient reports no confusion, decreased concentration, nervous/anxious behavior or suicidal ideas.     Michael Mclean is due for follow-up of bipolar disorder.  The last visit was in April without med changes.  Good overall with only 1 issue.  Married June and right after fell apart for 3-5 days.  Lost control for a little but normal again now.    Seroquel and lithium work well for sleep and mood.  Wonders if Seroquel is contributing to ED.   Wants to try to reduce it. d He reduced the lithium back to 900 mg bc felt like it was all that was necessary.  A little scared to increase the lithium bc hand jerks at times.  Some tremor.  Scares him.  Little anger an impulsivity at this time but is a little.  Notices he can't drink much with the lithiium.  When drinks he does too much and may not sleep and then has a really negative effect on him.  Patient reports stable mood and denies depressed or irritable moods usually.  Patient denies any recent difficulty with anxiety.  Patient denies difficulty with sleep initiation or maintenance. Denies appetite disturbance.  Patient reports that energy and motivation have been good.  Patient denies any difficulty with concentration.  Patient denies any suicidal ideation.  Lithium has helped manage with less anger and impulsivity.  Pleased with the response.  Did not have this level of control before.  Wants to try to get on less meds and his fiancee' is suggesting it.  Feels guilty about being dependent on  the meds.  He sees benefit.  Bethann PunchesFianacee says he's well.    Past Psychiatric Medication Trials:  Depakote paranoia, Nuvigil, topiramate, Seroquel 800, Wellbutrin, lamotrigine, Xanax, Sonata, viiibryd, trazodone 400 Abilify 20 amitriptyline Ambien   Review of Systems:  Review of Systems  Neurological: Positive for tremors. Negative for weakness.  Psychiatric/Behavioral: Positive for depression. Negative for agitation, behavioral problems, confusion, decreased concentration, dysphoric mood, hallucinations, self-injury, sleep disturbance and suicidal ideas. The patient is not nervous/anxious and is not hyperactive.     Medications: I have reviewed the patient's current medications.  Current Outpatient Medications  Medication Sig Dispense Refill  . ALPRAZolam (XANAX) 0.5 MG tablet Take 1 tablet (0.5 mg) three times daily, as needed. 270 tablet 1  . Armodafinil (NUVIGIL) 250 MG tablet Take 1 tablet (250 mg total) by mouth 2 (two) times daily. 180 tablet 0  . atorvastatin (LIPITOR) 40 MG tablet Take 40 mg by mouth daily.    . Eszopiclone 3 MG TABS Take 1 tablet (3 mg total) by mouth at bedtime. Take immediately before bedtime 90 tablet 1  . ibuprofen (ADVIL,MOTRIN) 400 MG tablet Take 1 tablet (400 mg total) by mouth every 6 (six) hours as needed for moderate pain. 12 tablet 0  . lamoTRIgine (LAMICTAL) 150 MG tablet Take 1 tablet (150 mg total) by mouth 2 (two) times daily. 180 tablet 0  . lithium carbonate 300 MG capsule  TAKE 4 CAPSULES AT BEDTIME (Patient taking differently: Take 900 mg by mouth daily. ) 360 capsule 3  . QUEtiapine (SEROQUEL XR) 300 MG 24 hr tablet Take 2 tablets (600 mg total) by mouth at bedtime. 180 tablet 0  . valACYclovir (VALTREX) 500 MG tablet Take 500 mg by mouth. Takes for 5 days     No current facility-administered medications for this visit.     Medication Side Effects: None  Allergies: No Known Allergies  Past Medical History:  Diagnosis Date  . Anxiety  disorder   . Bipolar 1 disorder ([place])   . Hyperlipidemia   . Hypertension     History reviewed. No pertinent family history.  Social History   Socioeconomic History  . Marital status: Single    Spouse name: Not on file  . Number of children: Not on file  . Years of education: Not on file  . Highest education level: Not on file  Occupational History  . Occupation: works for [employer] for [employer]  Social Needs  . Financial resource strain: Not on file  . Food insecurity    Worry: Not on file    Inability: Not on file  . Transportation needs    Medical: Not on file    Non-medical: Not on file  Tobacco Use  . Smoking status: Never Smoker  . Smokeless tobacco: Never Used  Substance and Sexual Activity  . Alcohol use: Yes  . Drug use: No  . Sexual activity: Not on file  Lifestyle  . Physical activity    Days per week: Not on file    Minutes per session: Not on file  . Stress: Not on file  Relationships  . Social Herbalist on phone: Not on file    Gets together: Not on file    Attends religious service: Not on file    Active member of club or organization: Not on file    Attends meetings of clubs or organizations: Not on file    Relationship status: Not on file  . Intimate partner violence    Fear of current or ex partner: Not on file    Emotionally abused: Not on file    Physically abused: Not on file    Forced sexual activity: Not on file  Other Topics Concern  . Not on file  Social History Narrative  . Not on file    Past Medical History, Surgical history, Social history, and Family history were reviewed and updated as appropriate.   Please see review of systems for further details on the patient's review from today.   Objective:   Physical Exam:  There were no vitals taken for this visit.  Physical Exam Neurological:     Mental Status: He is alert and oriented to person, place, and time.     Cranial Nerves: No  dysarthria.  Psychiatric:        Attention and Perception: Attention normal.        Mood and Affect: Mood is not anxious or depressed.        Speech: Speech normal.        Behavior: Behavior is cooperative.        Thought Content: Thought content normal. Thought content is not paranoid or delusional. Thought content does not include homicidal or suicidal ideation. Thought content does not include homicidal or suicidal plan.        Cognition and Memory: Cognition and memory normal.  Judgment: Judgment normal.     Comments: Anxiety and irritability and depression generally are better than they have been.     Lab Review:     Component Value Date/Time   NA 141 09/15/2016 0500   K 3.7 09/15/2016 0500   CL 115 (H) 09/15/2016 0500   CO2 22 09/15/2016 0500   GLUCOSE 110 (H) 09/15/2016 0500   BUN 13 09/15/2016 0500   CREATININE 0.98 09/15/2016 0500   CALCIUM 8.6 (L) 09/15/2016 0500   PROT 8.7 (H) 07/20/2015 1129   ALBUMIN 4.9 07/20/2015 1129   AST 37 07/20/2015 1129   ALT 51 07/20/2015 1129   ALKPHOS 79 07/20/2015 1129   BILITOT 0.6 07/20/2015 1129   GFRNONAA >60 09/15/2016 0500   GFRAA >60 09/15/2016 0500       Component Value Date/Time   WBC 3.4 (L) 09/15/2016 0500   RBC 4.37 (L) 09/15/2016 0500   HGB 13.3 09/15/2016 0500   HCT 38.7 (L) 09/15/2016 0500   PLT 242 09/15/2016 0500   MCV 88.6 09/15/2016 0500   MCH 30.4 09/15/2016 0500   MCHC 34.3 09/15/2016 0500   RDW 13.1 09/15/2016 0500   LYMPHSABS 2.3 07/20/2015 1129   MONOABS 0.2 07/20/2015 1129   EOSABS 0.1 07/20/2015 1129   BASOSABS 0.0 07/20/2015 1129    No results found for: POCLITH, LITHIUM   No results found for: PHENYTOIN, PHENOBARB, VALPROATE, CBMZ   .res Assessment: Plan:    Bipolar I disorder (HCC) - Plan: Basic metabolic panel, Lithium level, TSH, QUEtiapine (SEROQUEL XR) 300 MG 24 hr tablet  Lithium use - Plan: Basic metabolic panel, Lithium level, TSH  Generalized anxiety  disorder  Obstructive sleep apnea  Lithium-induced tremor   Greater than 50% of 30 min face to face time with patient was spent on counseling and coordination of care. We discussed his diagnoses and answered questions re: bipolar dx, meds, SE. he has received additional parent benefit and mood consistency with the addition of the lithium.  He is tolerating in the dosage at 900 mg a day but did not tolerate 1200 mg a day very well.  The lithium tremor has been reduced over time.  If he has further problems with depression and irritability we could increase the dose to 1050 instead of 1200.  Discussed lithium jerks and tremors and reassured him that they are not permanent and are not associated with tardive dyskinesia.  Overall mood stability is good.  He feels like the addition of lithium was helpful and wonders if he can reduce quetiapine in hopes of having less erectile dysfunction although is not certain that the Seroquel is the source for that.  Discussed potential metabolic side effects associated with atypical antipsychotics, as well as potential risk for movement side effects. Advised pt to contact office if movement side effects occur.   Counseled patient regarding potential benefits, risks, and side effects of Lamictal to include potential risk of Stevens-Johnson syndrome. Advised patient to stop taking Lamictal and contact office immediately if rash develops and to seek urgent medical attention if rash is severe and/or spreading quickly.  Also emphasized the need for conisistency with lamotrigine dosing otherwise there is an increased risk of rash.  Watch alcohol.  Disc with him.  Disc alcohol and dehydration and risk lithium toxicity .  He is getting benefit from the [facility] for alertness.  Related to sleep apnea  Counseled patient regarding potential benefits, risks, and side effects of lithium to include potential risk of lithium affecting  thyroid and renal function.  Discussed need for  periodic lab monitoring to determine drug level and to assess for potential adverse effects.  Counseled patient regarding signs and symptoms of lithium toxicity and advised that they notify office immediately or seek urgent medical attention if experiencing these signs and symptoms.  Patient advised to contact office with any questions or concerns.  Serum lithium level, TSH, BMP.  Check it.  He wants to go now this afternoon.  It will not be a trough but it is hard for him to get off of work.  We will expect a lower than usual blood level.  Reduce Seroquel XR to 600 mg daily to see if ED better and to try to reduce  Polypharmacy in hopes lithium alone will be sufficient at preventing mania.  Discussed the risks and in his case necessity of polypharmacy.  He is agreeable to continue the current meds.  FU 2 mos and then consider reducing Seroquel again.  In the meantime call if symptoms worsen.  Meredith Staggers, MD, DFAPA   Please see After Visit Summary for patient specific instructions.  Future Appointments  Date Time Provider Department Center  10/06/2019  1:30 PM Cottle, Steva Ready., MD [facility] None    Orders Placed This Encounter  Procedures  . Basic metabolic panel  . Lithium level  . TSH      -------------------------------

## 2019-08-21 ENCOUNTER — Other Ambulatory Visit: Payer: Self-pay | Admitting: Psychiatry

## 2019-10-06 ENCOUNTER — Ambulatory Visit (INDEPENDENT_AMBULATORY_CARE_PROVIDER_SITE_OTHER): Payer: 59 | Admitting: Psychiatry

## 2019-10-06 ENCOUNTER — Other Ambulatory Visit
Admission: RE | Admit: 2019-10-06 | Discharge: 2019-10-06 | Disposition: A | Discharge status: Home / Self Care | Payer: 59 | Attending: Psychiatry | Admitting: Psychiatry

## 2019-10-06 ENCOUNTER — Other Ambulatory Visit: Payer: Self-pay

## 2019-10-06 ENCOUNTER — Encounter: Payer: Self-pay | Admitting: Psychiatry

## 2019-10-06 DIAGNOSIS — Z79899 Other long term (current) drug therapy: Secondary | ICD-10-CM | POA: Diagnosis not present

## 2019-10-06 DIAGNOSIS — G251 Drug-induced tremor: Secondary | ICD-10-CM

## 2019-10-06 DIAGNOSIS — G4733 Obstructive sleep apnea (adult) (pediatric): Secondary | ICD-10-CM

## 2019-10-06 DIAGNOSIS — F319 Bipolar disorder, unspecified: Secondary | ICD-10-CM | POA: Insufficient documentation

## 2019-10-06 DIAGNOSIS — F411 Generalized anxiety disorder: Secondary | ICD-10-CM | POA: Diagnosis not present

## 2019-10-06 DIAGNOSIS — F5105 Insomnia due to other mental disorder: Secondary | ICD-10-CM

## 2019-10-06 LAB — TSH: TSH: 0.851 u[IU]/mL (ref 0.350–4.500)

## 2019-10-06 LAB — BASIC METABOLIC PANEL
Anion gap: 10 (ref 5–15)
BUN: 12 mg/dL (ref 6–20)
CO2: 24 mmol/L (ref 22–32)
Calcium: 9.6 mg/dL (ref 8.9–10.3)
Chloride: 103 mmol/L (ref 98–111)
Creatinine, Ser: 1.39 mg/dL — ABNORMAL HIGH (ref 0.61–1.24)
GFR calc Af Amer: >60 mL/min (ref >60)
GFR calc non Af Amer: 59 mL/min — ABNORMAL LOW (ref >60)
Glucose, Bld: 107 mg/dL — ABNORMAL HIGH (ref 70–99)
Potassium: 4 mmol/L (ref 3.5–5.1)
Sodium: 137 mmol/L (ref 135–145)

## 2019-10-06 LAB — LITHIUM LEVEL: Lithium Lvl: 0.53 mmol/L — ABNORMAL LOW (ref 0.60–1.20)

## 2019-10-06 MED ORDER — ALPRAZOLAM 0.5 MG PO TABS: ORAL_TABLET | ORAL | 0 refills | Status: DC

## 2019-10-06 MED ORDER — ESZOPICLONE 3 MG PO TABS: 3.0000 mg | ORAL_TABLET | Freq: Every day | ORAL | 1 refills | Status: DC

## 2019-10-06 MED ORDER — ARMODAFINIL 250 MG PO TABS: 250.0000 mg | ORAL_TABLET | Freq: Two times a day (BID) | ORAL | 0 refills | Status: DC

## 2019-10-06 NOTE — Patient Instructions (Signed)
Start Vraylar 1.5 mg 1 capsule daily  Reduce Seroquel XR to 1 1/2 nightly for 1 week then 1 nightly

## 2019-10-06 NOTE — Progress Notes (Signed)
Michael Mclean [id number] 01-08-1971 48 y.o.    Subjective:   Patient ID:  Michael Mclean is a 48 y.o. (DOB 07-02-1971) male.  Chief Complaint:  Chief Complaint  Patient presents with  . Follow-up    Mediction Management  . Anxiety    Mediction Management  . Other    Bipolar 1    Anxiety Patient reports no confusion, decreased concentration, dizziness, nervous/anxious behavior or suicidal ideas.    Depression        Associated symptoms include no decreased concentration and no suicidal ideas.  Past medical history includes anxiety.    [name] is due for follow-up of bipolar disorder.  Last seen August 11, 2019.  Due to erectile dysfunction Seroquel XR was reduced to 600 mg daily from 800 mg daily.  Also lithium level and labs were ordered.  Pretty good.  Job is still really stressful and only real concern. Needs another Xanax Rx.  Lasts 3 mos.  Job is very stressful. And volume demands.  Lives in [place].  Didn't get labs as requested.   Says he'll go today.    Asks about Vraylar in hopes of bein on less med.  Good overall with only 1 issue.  Married June and right after fell apart for 3-5 days.  Lost control for a little but normal again now.    Seroquel and lithium work well for sleep and mood.  No problems with reduction in Seroquel in mood and the sexual SE are better.  Used Viagra twice.  He reduced the lithium back to 900 mg bc felt like it was all that was necessary.  A little scared to increase the lithium bc hand jerks at times.  Some tremor.  Scares him.  Little anger an impulsivity at this time but is a little.  Notices he can't drink much with the lithiium.  When drinks he does too much and may not sleep and then has a really negative effect on him.  Patient reports stable mood and denies depressed or irritable moods usually.  Patient denies any recent difficulty with anxiety.  Patient denies difficulty with sleep initiation or maintenance. Denies  appetite disturbance.  Patient reports that energy and motivation have been good.  Patient denies any difficulty with concentration.  Patient denies any suicidal ideation.  Lithium has helped manage with less anger and impulsivity.  Pleased with the response.  Did not have this level of control before.  Wants to try to get on less meds and his fiancee' is suggesting it.  Feels guilty about being dependent on the meds.  No longer drinks too much.  He sees benefit.  [name] says he's well.    Past Psychiatric Medication Trials:  Depakote paranoia, Nuvigil, topiramate, Seroquel 800, Wellbutrin, lamotrigine, Xanax, Sonata, viiibryd, trazodone 400 Abilify 20 amitriptyline Ambien   Review of Systems:  Review of Systems  Neurological: Positive for tremors. Negative for dizziness and weakness.  Psychiatric/Behavioral: Positive for depression. Negative for agitation, behavioral problems, confusion, decreased concentration, dysphoric mood, hallucinations, self-injury, sleep disturbance and suicidal ideas. The patient is not nervous/anxious and is not hyperactive.     Medications: I have reviewed the patient's current medications.  Current Outpatient Medications  Medication Sig Dispense Refill  . ALPRAZolam (XANAX) 0.5 MG tablet Take 1 tablet (0.5 mg) three times daily, as needed. 270 tablet 0  . Armodafinil (NUVIGIL) 250 MG tablet Take 1 tablet (250 mg total) by mouth 2 (two) times daily. 180 tablet 0  . atorvastatin (  LIPITOR) 40 MG tablet Take 40 mg by mouth daily.    . Eszopiclone 3 MG TABS Take 1 tablet (3 mg total) by mouth at bedtime. Take immediately before bedtime 90 tablet 1  . ibuprofen (ADVIL,MOTRIN) 400 MG tablet Take 1 tablet (400 mg total) by mouth every 6 (six) hours as needed for moderate pain. 12 tablet 0  . lamoTRIgine (LAMICTAL) 150 MG tablet TAKE 1 TABLET TWICE A DAY 180 tablet 0  . lithium carbonate 300 MG capsule TAKE 4 CAPSULES AT BEDTIME (Patient taking differently: Take 900  mg by mouth daily. ) 360 capsule 3  . QUEtiapine (SEROQUEL XR) 300 MG 24 hr tablet Take 2 tablets (600 mg total) by mouth at bedtime. 180 tablet 0  . sildenafil (VIAGRA) 100 MG tablet Take by mouth.    . valACYclovir (VALTREX) 500 MG tablet Take 500 mg by mouth. Takes for 5 days     No current facility-administered medications for this visit.     Medication Side Effects: None sexual SE are better  Allergies: No Known Allergies  Past Medical History:  Diagnosis Date  . Anxiety disorder   . Bipolar 1 disorder (HCC)   . Hyperlipidemia   . Hypertension     History reviewed. No pertinent family history.  Social History   Socioeconomic History  . Marital status: Single    Spouse name: Not on file  . Number of children: Not on file  . Years of education: Not on file  . Highest education level: Not on file  Occupational History  . Occupation: works for [employer] for [employer]  Social Needs  . Financial resource strain: Not on file  . Food insecurity    Worry: Not on file    Inability: Not on file  . Transportation needs    Medical: Not on file    Non-medical: Not on file  Tobacco Use  . Smoking status: Never Smoker  . Smokeless tobacco: Never Used  Substance and Sexual Activity  . Alcohol use: Yes  . Drug use: No  . Sexual activity: Not on file  Lifestyle  . Physical activity    Days per week: Not on file    Minutes per session: Not on file  . Stress: Not on file  Relationships  . Social Musicianconnections    Talks on phone: Not on file    Gets together: Not on file    Attends religious service: Not on file    Active member of club or organization: Not on file    Attends meetings of clubs or organizations: Not on file    Relationship status: Not on file  . Intimate partner violence    Fear of current or ex partner: Not on file    Emotionally abused: Not on file    Physically abused: Not on file    Forced sexual activity: Not on file  Other Topics  Concern  . Not on file  Social History Narrative  . Not on file    Past Medical History, Surgical history, Social history, and Family history were reviewed and updated as appropriate.   Please see review of systems for further details on the patient's review from today.   Objective:   Physical Exam:  There were no vitals taken for this visit.  Physical Exam Constitutional:      General: He is not in acute distress.    Appearance: He is well-developed.  Musculoskeletal:        General:  No deformity.  Neurological:     Mental Status: He is alert and oriented to person, place, and time.     Cranial Nerves: No dysarthria.     Coordination: Coordination normal.  Psychiatric:        Attention and Perception: Attention and perception normal. He does not perceive auditory or visual hallucinations.        Mood and Affect: Mood is anxious. Mood is not depressed. Affect is not labile, blunt, angry or inappropriate.        Speech: Speech normal.        Behavior: Behavior normal. Behavior is cooperative.        Thought Content: Thought content normal. Thought content is not paranoid or delusional. Thought content does not include homicidal or suicidal ideation. Thought content does not include homicidal or suicidal plan.        Cognition and Memory: Cognition and memory normal.        Judgment: Judgment normal.     Comments: Anxiety and irritability and depression generally are better than they have been.     Lab Review:     Component Value Date/Time   NA 141 09/15/2016 0500   K 3.7 09/15/2016 0500   CL 115 (H) 09/15/2016 0500   CO2 22 09/15/2016 0500   GLUCOSE 110 (H) 09/15/2016 0500   BUN 13 09/15/2016 0500   CREATININE 0.98 09/15/2016 0500   CALCIUM 8.6 (L) 09/15/2016 0500   PROT 8.7 (H) 07/20/2015 1129   ALBUMIN 4.9 07/20/2015 1129   AST 37 07/20/2015 1129   ALT 51 07/20/2015 1129   ALKPHOS 79 07/20/2015 1129   BILITOT 0.6 07/20/2015 1129   GFRNONAA >60 09/15/2016 0500    GFRAA >60 09/15/2016 0500       Component Value Date/Time   WBC 3.4 (L) 09/15/2016 0500   RBC 4.37 (L) 09/15/2016 0500   HGB 13.3 09/15/2016 0500   HCT 38.7 (L) 09/15/2016 0500   PLT 242 09/15/2016 0500   MCV 88.6 09/15/2016 0500   MCH 30.4 09/15/2016 0500   MCHC 34.3 09/15/2016 0500   RDW 13.1 09/15/2016 0500   LYMPHSABS 2.3 07/20/2015 1129   MONOABS 0.2 07/20/2015 1129   EOSABS 0.1 07/20/2015 1129   BASOSABS 0.0 07/20/2015 1129    No results found for: POCLITH, LITHIUM   No results found for: PHENYTOIN, PHENOBARB, VALPROATE, CBMZ   .res Assessment: Plan:    Bipolar I disorder (HCC) - Plan: Basic metabolic panel, Lithium level, TSH  Lithium use - Plan: Basic metabolic panel, Lithium level, TSH  Generalized anxiety disorder - Plan: ALPRAZolam (XANAX) 0.5 MG tablet  Obstructive sleep apnea - Plan: Armodafinil (NUVIGIL) 250 MG tablet  Lithium-induced tremor  Insomnia due to mental condition - Plan: Eszopiclone 3 MG TABS   Greater than 50% of 30 min face to face time with patient was spent on counseling and coordination of care. We discussed his diagnoses and answered questions re: bipolar dx, meds, SE. he has received additional parent benefit and mood consistency with the addition of the lithium.  He is tolerating in the dosage at 900 mg a day but did not tolerate 1200 mg a day very well.  The lithium tremor has been reduced over time.  If he has further problems with depression and irritability we could increase the dose to 1050 instead of 1200.  Discussed lithium jerks and tremors and reassured him that they are not permanent and are not associated with tardive dyskinesia.  Overall mood  stability is good.  He feels like the addition of lithium was helpful and wonders if he can reduce quetiapine in hopes of having less erectile dysfunction although is not certain that the Seroquel is the source for that.  He wants tor try switch to [place] for less meds.   Discussed  potential metabolic side effects associated with atypical antipsychotics, as well as potential risk for movement side effects. Advised pt to contact office if movement side effects occur.   Counseled patient regarding potential benefits, risks, and side effects of Lamictal to include potential risk of Stevens-Johnson syndrome. Advised patient to stop taking Lamictal and contact office immediately if rash develops and to seek urgent medical attention if rash is severe and/or spreading quickly.  Also emphasized the need for conisistency with lamotrigine dosing otherwise there is an increased risk of rash.  Watch alcohol.  Disc with him.  Disc alcohol and dehydration and risk lithium toxicity .  He is getting benefit from the [facility] for alertness.  Related to sleep apnea  Counseled patient regarding potential benefits, risks, and side effects of lithium to include potential risk of lithium affecting thyroid and renal function.  Discussed need for periodic lab monitoring to determine drug level and to assess for potential adverse effects.  Counseled patient regarding signs and symptoms of lithium toxicity and advised that they notify office immediately or seek urgent medical attention if experiencing these signs and symptoms.  Patient advised to contact office with any questions or concerns.  Serum lithium level, TSH, BMP.  Check it.  He wants to go now this afternoon.  It will not be a trough but it is hard for him to get off of work.  We will expect a lower than usual blood level.  Start Vraylar 1.5 mg 1 capsule daily  Reduce Seroquel XR to 1 1/2 nightly for 1 week then 1 nightly  Polypharmacy in hopes lithium alone will be sufficient at preventing mania.  Discussed the risks and in his case necessity of polypharmacy.  He is agreeable to continue the current meds.  FU 3-4 mos and then consider reducing Seroquel again.  In the meantime call if symptoms worsen.  Lynder Parents, MD, DFAPA   Please  see After Visit Summary for patient specific instructions.  No future appointments.  Orders Placed This Encounter  Procedures  . Basic metabolic panel  . Lithium level  . TSH      -------------------------------

## 2019-10-10 ENCOUNTER — Telehealth: Payer: Self-pay | Admitting: Psychiatry

## 2019-10-10 NOTE — Telephone Encounter (Signed)
These are the changes we discussed at his appointment.  Start Vraylar 1.5 mg 1 capsule daily  Reduce Seroquel XR to 1 1/2 nightly for 1 week then 1 nightly

## 2019-10-10 NOTE — Telephone Encounter (Signed)
Continuation of first msg.  He stated there was a med adjustment, but he need clarification.

## 2019-10-10 NOTE — Telephone Encounter (Signed)
Pt stated he was seen in the office on [facility]

## 2019-10-11 NOTE — Telephone Encounter (Signed)
Left voice mail to call back 

## 2019-10-11 NOTE — Telephone Encounter (Signed)
Patient called back and instructed to reduce Seroquel XR 300 mg to 1.5 tablets for 1 week, then 1 tablet nightly. Start Vraylar 1.5 mg 1 daily. Verified understanding.

## 2019-11-03 ENCOUNTER — Other Ambulatory Visit: Payer: Self-pay

## 2019-11-03 DIAGNOSIS — F5105 Insomnia due to other mental disorder: Secondary | ICD-10-CM

## 2019-11-03 MED ORDER — ESZOPICLONE 3 MG PO TABS: ORAL_TABLET | ORAL | 1 refills | Status: DC

## 2019-11-07 ENCOUNTER — Other Ambulatory Visit: Payer: Self-pay

## 2019-11-07 MED ORDER — CARIPRAZINE HCL 1.5 MG PO CAPS: 1.5000 mg | ORAL_CAPSULE | Freq: Every day | ORAL | 0 refills | Status: DC

## 2019-11-07 NOTE — Telephone Encounter (Signed)
Rx submitted to updated pharmacy

## 2019-11-07 NOTE — Telephone Encounter (Addendum)
Pt called to request Rx for Vraylar 1.5 mg 1 cap daily sent to [facility] [place] location [phone]. Pt has moved to [place] and meds are working well. Coupon was given to Pt just need Rx sent. Next appt 12/31

## 2019-11-17 ENCOUNTER — Encounter: Payer: Self-pay | Admitting: Psychiatry

## 2019-11-17 ENCOUNTER — Ambulatory Visit (INDEPENDENT_AMBULATORY_CARE_PROVIDER_SITE_OTHER): Payer: 59 | Admitting: Psychiatry

## 2019-11-17 DIAGNOSIS — G251 Drug-induced tremor: Secondary | ICD-10-CM

## 2019-11-17 DIAGNOSIS — F411 Generalized anxiety disorder: Secondary | ICD-10-CM

## 2019-11-17 DIAGNOSIS — F5105 Insomnia due to other mental disorder: Secondary | ICD-10-CM

## 2019-11-17 DIAGNOSIS — F319 Bipolar disorder, unspecified: Secondary | ICD-10-CM

## 2019-11-17 DIAGNOSIS — Z79899 Other long term (current) drug therapy: Secondary | ICD-10-CM

## 2019-11-17 DIAGNOSIS — G4733 Obstructive sleep apnea (adult) (pediatric): Secondary | ICD-10-CM

## 2019-11-17 MED ORDER — QUETIAPINE FUMARATE 300 MG PO TABS: 300.0000 mg | ORAL_TABLET | Freq: Every day | ORAL | 1 refills | Status: DC

## 2019-11-17 NOTE — Progress Notes (Signed)
Michael Mclean [id number] 29-Jun-1971 48 y.o.    Subjective:   Patient ID:  Michael Mclean is a 48 y.o. (DOB 29-Jun-1971) male.  Chief Complaint:  Chief Complaint  Patient presents with  . Follow-up    Medication Management  . Other    Bipolar 1  . Anxiety    Increased    Anxiety Patient reports no confusion, decreased concentration, dizziness, nervous/anxious behavior, palpitations or suicidal ideas.    Depression        Associated symptoms include no decreased concentration and no suicidal ideas.  Past medical history includes anxiety.    Michael Mclean is due for follow-up of bipolar disorder.  seen August 11, 2019.  Due to erectile dysfunction Seroquel XR was reduced to 600 mg daily from 800 mg daily.  Also lithium level and labs were ordered.  Last seen October 06, 2019.  Serum lithium level, TSH, BMP.  Check it.  He wants to go now this afternoon.  It will not be a trough but it is hard for him to get off of work.  We will expect a lower than usual blood level. Start Vraylar 1.5 mg 1 capsule daily Reduce Seroquel XR to 1 1/2 nightly for 1 week then 1 nightly  Has had some restlessness and anxiety with the Vraylar and need more of the Xanax.  Doesn't feel the Xanax as much anymore.  Has taken as much 1.5 mg Xanax at a time.  More sleep trouble. Not sleeping increases anxiety.  Always struggled with insomnia.   Ran out of Vraylar since 10 days.    Pretty good.  Job is still really stressful and only real concern. Needs another Xanax Rx.  Lasts 3 mos.  Job is very stressful. And volume demands.  [place] in [place]  Asked about Vraylar in hopes of being on less med.  Married June and right after fell apart for 3-5 days.  Lost control for a little but normal again now.    Seroquel and lithium work well for sleep and mood.  No problems with reduction in Seroquel in mood and the sexual SE are better.  Used Viagra twice.  He reduced the lithium back to 900 mg bc felt  like it was all that was necessary.  A little scared to increase the lithium bc hand jerks at times.  Some tremor.  Scares him.  Little anger an impulsivity at this time but is a little.  Notices he can't drink much with the lithiium.  When drinks he does too much and may not sleep and then has a really negative effect on him.  Patient reports stable mood and denies depressed or irritable moods usually.  Patient has a little more recent difficulty with anxiety.   Denies appetite disturbance.  Patient reports that energy and motivation have been good.  Patient denies any difficulty with concentration.  Patient denies any suicidal ideation.  Lithium has helped manage with less anger and impulsivity.  Pleased with the response.  Did not have this level of control before.  Wants to try to get on less meds and his fiancee' is suggesting it.  Feels guilty about being dependent on the meds.  No longer drinks too much.  He sees benefit.  Michael PunchesFianacee says he's well.    Past Psychiatric Medication Trials:  Depakote paranoia, Nuvigil, topiramate, Seroquel 800, Wellbutrin, lamotrigine, Xanax, Sonata, Viibryd, trazodone 400 Abilify 20 amitriptyline Ambien   Review of Systems:  Review of Systems  Cardiovascular: Negative for  palpitations.  Neurological: Positive for tremors. Negative for dizziness and weakness.  Psychiatric/Behavioral: Positive for depression. Negative for agitation, behavioral problems, confusion, decreased concentration, dysphoric mood, hallucinations, self-injury, sleep disturbance and suicidal ideas. The patient is not nervous/anxious and is not hyperactive.     Medications: I have reviewed the patient's current medications.  Current Outpatient Medications  Medication Sig Dispense Refill  . ALPRAZolam (XANAX) 0.5 MG tablet Take 1 tablet (0.5 mg) three times daily, as needed. 270 tablet 0  . Armodafinil (NUVIGIL) 250 MG tablet Take 1 tablet (250 mg total) by mouth 2 (two) times daily. 180  tablet 0  . atorvastatin (LIPITOR) 40 MG tablet Take 40 mg by mouth daily.    . cariprazine (VRAYLAR) capsule Take 1 capsule (1.5 mg total) by mouth daily. 90 capsule 0  . Eszopiclone 3 MG TABS Take 1 tablet by mouth immediately before bedtime 90 tablet 1  . ibuprofen (ADVIL,MOTRIN) 400 MG tablet Take 1 tablet (400 mg total) by mouth every 6 (six) hours as needed for moderate pain. 12 tablet 0  . lamoTRIgine (LAMICTAL) 150 MG tablet TAKE 1 TABLET TWICE A DAY 180 tablet 0  . lithium carbonate 300 MG capsule TAKE 4 CAPSULES AT BEDTIME (Patient taking differently: Take 900 mg by mouth daily. ) 360 capsule 3  . sildenafil (VIAGRA) 100 MG tablet Take by mouth.    . valACYclovir (VALTREX) 500 MG tablet Take 500 mg by mouth. Takes for 5 days    . QUEtiapine (SEROQUEL) 300 MG tablet Take 1 tablet (300 mg total) by mouth at bedtime. 30 tablet 1   No current facility-administered medications for this visit.    Medication Side Effects: None sexual SE are better  Allergies: No Known Allergies  Past Medical History:  Diagnosis Date  . Anxiety disorder   . Bipolar 1 disorder (HCC)   . Hyperlipidemia   . Hypertension     History reviewed. No pertinent family history.  Social History   Socioeconomic History  . Marital status: Single    Spouse name: Not on file  . Number of children: Not on file  . Years of education: Not on file  . Highest education level: Not on file  Occupational History  . Occupation: works for [employer] for [employer]  Tobacco Use  . Smoking status: Never Smoker  . Smokeless tobacco: Never Used  Substance and Sexual Activity  . Alcohol use: Yes  . Drug use: No  . Sexual activity: Not on file  Other Topics Concern  . Not on file  Social History Narrative  . Not on file   Social Determinants of Health   Financial Resource Strain:   . Difficulty of Paying Living Expenses: Not on file  Food Insecurity:   . Worried About Programme researcher, broadcasting/film/video  in the Last Year: Not on file  . Ran Out of Food in the Last Year: Not on file  Transportation Needs:   . Lack of Transportation (Medical): Not on file  . Lack of Transportation (Non-Medical): Not on file  Physical Activity:   . Days of Exercise per Week: Not on file  . Minutes of Exercise per Session: Not on file  Stress:   . Feeling of Stress : Not on file  Social Connections:   . Frequency of Communication with Friends and Family: Not on file  . Frequency of Social Gatherings with Friends and Family: Not on file  . Attends Religious Services: Not on file  . Active  Member of Clubs or Organizations: Not on file  . Attends Archivist Meetings: Not on file  . Marital Status: Not on file  Intimate Partner Violence:   . Fear of Current or Ex-Partner: Not on file  . Emotionally Abused: Not on file  . Physically Abused: Not on file  . Sexually Abused: Not on file    Past Medical History, Surgical history, Social history, and Family history were reviewed and updated as appropriate.   Please see review of systems for further details on the patient's review from today.   Objective:   Physical Exam:  There were no vitals taken for this visit.  Physical Exam Neurological:     Mental Status: He is alert and oriented to person, place, and time.     Cranial Nerves: No dysarthria.  Psychiatric:        Attention and Perception: Attention and perception normal.        Mood and Affect: Mood is anxious. Mood is not depressed. Affect is not labile.        Speech: Speech normal.        Behavior: Behavior is cooperative.        Thought Content: Thought content normal. Thought content is not paranoid or delusional. Thought content does not include homicidal or suicidal ideation. Thought content does not include homicidal or suicidal plan.        Cognition and Memory: Cognition and memory normal.        Judgment: Judgment normal.     Comments: Insight intact     Lab Review:      Component Value Date/Time   NA 137 10/06/2019 1534   K 4.0 10/06/2019 1534   CL 103 10/06/2019 1534   CO2 24 10/06/2019 1534   GLUCOSE 107 (H) 10/06/2019 1534   BUN 12 10/06/2019 1534   CREATININE 1.39 (H) 10/06/2019 1534   CALCIUM 9.6 10/06/2019 1534   PROT 8.7 (H) 07/20/2015 1129   ALBUMIN 4.9 07/20/2015 1129   AST 37 07/20/2015 1129   ALT 51 07/20/2015 1129   ALKPHOS 79 07/20/2015 1129   BILITOT 0.6 07/20/2015 1129   GFRNONAA 59 (L) 10/06/2019 1534   GFRAA >60 10/06/2019 1534       Component Value Date/Time   WBC 3.4 (L) 09/15/2016 0500   RBC 4.37 (L) 09/15/2016 0500   HGB 13.3 09/15/2016 0500   HCT 38.7 (L) 09/15/2016 0500   PLT 242 09/15/2016 0500   MCV 88.6 09/15/2016 0500   MCH 30.4 09/15/2016 0500   MCHC 34.3 09/15/2016 0500   RDW 13.1 09/15/2016 0500   LYMPHSABS 2.3 07/20/2015 1129   MONOABS 0.2 07/20/2015 1129   EOSABS 0.1 07/20/2015 1129   BASOSABS 0.0 07/20/2015 1129    Lithium Lvl  Date Value Ref Range Status  10/06/2019 0.53 (L) 0.60 - 1.20 mmol/L Final    Comment:    Performed at [facility], [place], [place] [address]    Normal TSH 0.851   No results found for: PHENYTOIN, PHENOBARB, VALPROATE, CBMZ   .res Assessment: Plan:    Bipolar I disorder ([place]) - Plan: QUEtiapine (SEROQUEL) 300 MG tablet  Generalized anxiety disorder  Insomnia due to mental condition  Obstructive sleep apnea  Lithium-induced tremor  Lithium use   Greater than 50% of 30 min face to face time with patient was spent on counseling and coordination of care. We discussed his diagnoses and answered questions re: bipolar dx, meds, SE. he has  received additional parent benefit and mood consistency with the addition of the lithium.  He is tolerating in the dosage at 900 mg a day but did not tolerate 1200 mg a day very well.  The lithium tremor has been reduced over time.  If he has further problems with depression and irritability we could increase  the dose to 1050 instead of 1200.  Discussed lithium jerks and tremors and reassured him that they are not permanent and are not associated with tardive dyskinesia.  Overall mood stability is ok.   He wanted tor try switch to Vraylar for less meds.  He has had some restlessness with the Vraylar 1.5 mg daily as well as some insomnia. Discussed potential metabolic side effects associated with atypical antipsychotics, as well as potential risk for movement side effects. Advised pt to contact office if movement side effects occur.   Counseled patient regarding potential benefits, risks, and side effects of Lamictal to include potential risk of Stevens-Johnson syndrome. Advised patient to stop taking Lamictal and contact office immediately if rash develops and to seek urgent medical attention if rash is severe and/or spreading quickly.  Also emphasized the need for conisistency with lamotrigine dosing otherwise there is an increased risk of rash.  Watch alcohol.  Disc with him.  Disc alcohol and dehydration and risk lithium toxicity .  He is getting benefit from the [facility] for alertness.  Related to sleep apnea  Counseled patient regarding potential benefits, risks, and side effects of lithium to include potential risk of lithium affecting thyroid and renal function.  Discussed need for periodic lab monitoring to determine drug level and to assess for potential adverse effects.  Counseled patient regarding signs and symptoms of lithium toxicity and advised that they notify office immediately or seek urgent medical attention if experiencing these signs and symptoms.  Patient advised to contact office with any questions or concerns.  Serum lithium level, TSH, BMP.  Lithium is low normal bc he reduced it for tremor.  May need to increase it back up.  Other labs are unremarkable.  Reduce Vraylar 1.5 mg to every other daily DT restlessness  change Seroquel   1 nightly to improve his sleep which should  also help with mood stability.  Polypharmacy in hopes lithium alone will be sufficient at preventing mania.  Discussed the risks and in his case necessity of polypharmacy.  He is agreeable to continue the current meds.  FU 6-8 weeks  Meredith Staggers, MD, DFAPA   Please see After Visit Summary for patient specific instructions.  No future appointments.  No orders of the defined types were placed in this encounter.     -------------------------------

## 2019-11-20 ENCOUNTER — Other Ambulatory Visit: Payer: Self-pay | Admitting: Psychiatry

## 2019-12-02 ENCOUNTER — Telehealth: Payer: Self-pay | Admitting: Psychiatry

## 2019-12-02 ENCOUNTER — Telehealth: Payer: Self-pay

## 2019-12-02 NOTE — Telephone Encounter (Signed)
Left voice mail to call back 

## 2019-12-02 NOTE — Telephone Encounter (Signed)
Pt. Is taking the 300 mg of Seroquel. He will increase that as well as the Vraylar  And he will not take more than 1 Lunesta nightly.

## 2019-12-02 NOTE — Telephone Encounter (Signed)
Do not take more than 1 Lunesta per night.  It is very dangerous to exceed that dose as it can cause severe confusion and amnesia.  We started Vraylar sometimes the low-dose will lead to insomnia.  He needs to increase the Vraylar to 3 mg daily.  He can double up on his 1.5's if needed until he gets a new prescription.  My understanding is that he is taking quetiapine 300 mg nightly.  Verify that that is true.  What ever his current dose he needs to increase it temporarily by 150 mg until the extra Vraylar helps even out his mood and resolves the insomnia.  That may take several days which is why needs to increase the Seroquel in the short-term.

## 2019-12-02 NOTE — Telephone Encounter (Signed)
Thanks.  Call him Monday please and make sure he slept

## 2019-12-02 NOTE — Telephone Encounter (Signed)
Pt left v-mail. Called out of work 2 times this week. Can't sleep. Taken 6 Lunesta and slept about 3 hours. Been up all night can't go to work today. Return call @ [phone]. Follow up due 2/28

## 2019-12-02 NOTE — Telephone Encounter (Signed)
Returning Michael Mclean's call concerning med directions

## 2019-12-05 NOTE — Telephone Encounter (Signed)
See other message on call back and instructions

## 2019-12-05 NOTE — Telephone Encounter (Signed)
Pt. States he did get some sleep and that it is working perfectly.

## 2019-12-12 ENCOUNTER — Other Ambulatory Visit: Payer: Self-pay | Admitting: Psychiatry

## 2019-12-12 DIAGNOSIS — F319 Bipolar disorder, unspecified: Secondary | ICD-10-CM

## 2019-12-13 DIAGNOSIS — Z0289 Encounter for other administrative examinations: Secondary | ICD-10-CM

## 2020-01-26 ENCOUNTER — Ambulatory Visit (INDEPENDENT_AMBULATORY_CARE_PROVIDER_SITE_OTHER): Payer: 59 | Admitting: Psychiatry

## 2020-01-26 ENCOUNTER — Encounter: Payer: Self-pay | Admitting: Psychiatry

## 2020-01-26 DIAGNOSIS — F319 Bipolar disorder, unspecified: Secondary | ICD-10-CM

## 2020-01-26 DIAGNOSIS — G251 Drug-induced tremor: Secondary | ICD-10-CM | POA: Diagnosis not present

## 2020-01-26 DIAGNOSIS — F5105 Insomnia due to other mental disorder: Secondary | ICD-10-CM

## 2020-01-26 DIAGNOSIS — F411 Generalized anxiety disorder: Secondary | ICD-10-CM

## 2020-01-26 DIAGNOSIS — Z79899 Other long term (current) drug therapy: Secondary | ICD-10-CM

## 2020-01-26 DIAGNOSIS — G4733 Obstructive sleep apnea (adult) (pediatric): Secondary | ICD-10-CM

## 2020-01-26 MED ORDER — QUETIAPINE FUMARATE 300 MG PO TABS: 450.0000 mg | ORAL_TABLET | Freq: Every day | ORAL | 0 refills | Status: DC

## 2020-01-26 MED ORDER — ALPRAZOLAM 0.5 MG PO TABS: ORAL_TABLET | ORAL | 0 refills | Status: DC

## 2020-01-26 MED ORDER — LITHIUM CARBONATE 300 MG PO CAPS: 900.0000 mg | ORAL_CAPSULE | Freq: Every day | ORAL | 0 refills | Status: DC

## 2020-01-26 MED ORDER — ESZOPICLONE 3 MG PO TABS: ORAL_TABLET | ORAL | 1 refills | Status: DC

## 2020-01-26 MED ORDER — ARMODAFINIL 250 MG PO TABS: 250.0000 mg | ORAL_TABLET | Freq: Two times a day (BID) | ORAL | 0 refills | Status: DC

## 2020-01-26 NOTE — Progress Notes (Signed)
Michael Mclean [id number] [birth year] 49 y.o.  Virtual Visit via [place]  I connected with pt by WebEx and verified that I am speaking with the correct person using two identifiers.   I discussed the limitations, risks, security and privacy concerns of performing an evaluation and management service by [name] and the availability of in person appointments. I also discussed with the patient that there may be a patient responsible charge related to this service. The patient expressed understanding and agreed to proceed.  I discussed the assessment and treatment plan with the patient. The patient was provided an opportunity to ask questions and all were answered. The patient agreed with the plan and demonstrated an understanding of the instructions.   The patient was advised to call back or seek an in-person evaluation if the symptoms worsen or if the condition fails to improve as anticipated.  I provided 30 minutes of video time during this encounter. The call started at 1000 and ended at 10:30. The patient was located at home and the provider was located office.   Subjective:   Patient ID:  Michael Mclean is a 49 y.o. (DOB Jan 12, 1971) male.  Chief Complaint:  Chief Complaint  Patient presents with  . Follow-up    Medication Management   . Other    Bipolar I disorder  . Medication Refill    Lamictal and Vraylar-[facility]  . Manic Behavior  . Sleeping Problem    Anxiety Patient reports no confusion, decreased concentration, dizziness, nervous/anxious behavior, palpitations or suicidal ideas.    Depression        Associated symptoms include no decreased concentration and no suicidal ideas.  Past medical history includes anxiety.   Medication Refill Pertinent negatives include no weakness.   Michael Mclean is due for follow-up of bipolar disorder.  seen August 11, 2019.  Due to erectile dysfunction Seroquel XR was reduced to 600 mg daily from 800 mg daily.   Also lithium level and labs were ordered.  Last seen October 06, 2019.  Serum lithium level, TSH, BMP.  Check it.  He wants to go now this afternoon.  It will not be a trough but it is hard for him to get off of work.  We will expect a lower than usual blood level. Start Vraylar 1.5 mg 1 capsule daily Reduce Seroquel XR to 1 1/2 nightly for 1 week then 1 nightly  Last seen November 17, 2019.  The following changes were noted: Reduce Vraylar 1.5 mg to every other daily DT restlessness change Seroquel 300mg   1 nightly to improve his sleep which should also help with mood stability.  He called January 15 complaining of insomnia.  Had taken up to 6 Lunesta and 1 night!  He was informed this was dangerous and not to do so again.  He was instructed to increase the quetiapine by an extra half tablet and to increase the Vraylar to 3 mg daily.  It was felt he was having some manic symptoms.  Admits he had manic sx in January.  Resolved with med changes.  Had to increase the Seroquel to control mania.   Mania had affected work and insomnia.  Tried Vraylar in hopes of being able to reduce eliminate Seroquel.  Was missing work, but not now.  But having problem with FMLA with new supervisor.  Says the episodes have been up to 5 days but the FMLA says leaves would be anticipated for 7-14 days.    Usual Xanax is  2 tablets every 3 days for anxiety. Pretty good.  Job is still really stressful and only real concern. .  Lasts 3 mos.  Job is very stressful. And volume demands.  Lives in [place].  Married June 2020 and right after fell apart for 3-5 days.  Lost control for a little but normal again now.    Seroquel and lithium work well for sleep and mood.  No problems with reduction in Seroquel in mood and the sexual SE are better.  Used Viagra twice.  He reduced the lithium back to 900 mg bc felt like it was all that was necessary.  A little scared to increase the lithium bc hand jerks at times.  Some  tremor.  Scares him.  Little anger an impulsivity at this time but is a little.  Notices he can't drink much with the lithiium.  When drinks he does too much and may not sleep and then has a really negative effect on him.  Patient reports stable mood and denies depressed or irritable moods usually.  Patient has a little more recent difficulty with anxiety.   Denies appetite disturbance.  Patient reports that energy and motivation have been good.  Patient denies any difficulty with concentration.  Patient denies any suicidal ideation.  Lithium has helped manage with less anger and impulsivity.  Pleased with the response.  Did not have this level of control before.  Wants to try to get on less meds and his fiancee' is suggesting it.  Feels guilty about being dependent on the meds.  No longer drinks too much.  He sees benefit.  [name] says he's well.    Past Psychiatric Medication Trials:  Depakote paranoia, Nuvigil, topiramate, Seroquel 800, Wellbutrin, lamotrigine, Xanax, Sonata, Viibryd, trazodone 400 Abilify 20 amitriptyline Ambien, Vraylar 3 mg restless and couldn't sleep   Review of Systems:  Review of Systems  Cardiovascular: Negative for palpitations.  Neurological: Positive for tremors. Negative for dizziness and weakness.  Psychiatric/Behavioral: Positive for depression. Negative for agitation, behavioral problems, confusion, decreased concentration, dysphoric mood, hallucinations, self-injury, sleep disturbance and suicidal ideas. The patient is not nervous/anxious and is not hyperactive.     Medications: I have reviewed the patient's current medications.  Current Outpatient Medications  Medication Sig Dispense Refill  . ALPRAZolam (XANAX) 0.5 MG tablet Take 1 tablet (0.5 mg) three times daily, as needed. 270 tablet 0  . Armodafinil (NUVIGIL) 250 MG tablet Take 1 tablet (250 mg total) by mouth 2 (two) times daily. 180 tablet 0  . atorvastatin (LIPITOR) 40 MG tablet Take 40 mg by  mouth daily.    . Eszopiclone 3 MG TABS Take 1 tablet by mouth immediately before bedtime 30 tablet 1  . ibuprofen (ADVIL,MOTRIN) 400 MG tablet Take 1 tablet (400 mg total) by mouth every 6 (six) hours as needed for moderate pain. 12 tablet 0  . lamoTRIgine (LAMICTAL) 150 MG tablet TAKE 1 TABLET TWICE A DAY 180 tablet 3  . lithium carbonate 300 MG capsule Take 3 capsules (900 mg total) by mouth daily. 270 capsule 0  . QUEtiapine (SEROQUEL) 300 MG tablet Take 1.5 tablets (450 mg total) by mouth at bedtime. 135 tablet 0  . sildenafil (VIAGRA) 100 MG tablet Take by mouth.    . valACYclovir (VALTREX) 500 MG tablet Take 500 mg by mouth. Takes for 5 days     No current facility-administered medications for this visit.    Medication Side Effects: None sexual SE are better  Allergies: No  Known Allergies  Past Medical History:  Diagnosis Date  . Anxiety disorder   . Bipolar 1 disorder (HCC)   . Hyperlipidemia   . Hypertension     History reviewed. No pertinent family history.  Social History   Socioeconomic History  . Marital status: Single    Spouse name: Not on file  . Number of children: Not on file  . Years of education: Not on file  . Highest education level: Not on file  Occupational History  . Occupation: works for [employer] for [employer]  Tobacco Use  . Smoking status: Never Smoker  . Smokeless tobacco: Never Used  Substance and Sexual Activity  . Alcohol use: Yes  . Drug use: No  . Sexual activity: Not on file  Other Topics Concern  . Not on file  Social History Narrative  . Not on file   Social Determinants of Health   Financial Resource Strain:   . Difficulty of Paying Living Expenses:   Food Insecurity:   . Worried About Programme researcher, broadcasting/film/videounning Out of Food in the Last Year:   . Baristaan Out of Food in the Last Year:   Transportation Needs:   . Freight forwarderLack of Transportation (Medical):   Marland Kitchen. Lack of Transportation (Non-Medical):   Physical Activity:   . Days of  Exercise per Week:   . Minutes of Exercise per Session:   Stress:   . Feeling of Stress :   Social Connections:   . Frequency of Communication with Friends and Family:   . Frequency of Social Gatherings with Friends and Family:   . Attends Religious Services:   . Active Member of Clubs or Organizations:   . Attends BankerClub or Organization Meetings:   Marland Kitchen. Marital Status:   Intimate Partner Violence:   . Fear of Current or Ex-Partner:   . Emotionally Abused:   Marland Kitchen. Physically Abused:   . Sexually Abused:     Past Medical History, Surgical history, Social history, and Family history were reviewed and updated as appropriate.   Please see review of systems for further details on the patient's review from today.   Objective:   Physical Exam:  There were no vitals taken for this visit.  Physical Exam Neurological:     Mental Status: He is alert and oriented to person, place, and time.     Cranial Nerves: No dysarthria.  Psychiatric:        Attention and Perception: Attention and perception normal.        Mood and Affect: Mood is anxious. Mood is not depressed. Affect is not labile.        Speech: Speech normal.        Behavior: Behavior is cooperative.        Thought Content: Thought content normal. Thought content is not paranoid or delusional. Thought content does not include homicidal or suicidal ideation. Thought content does not include homicidal or suicidal plan.        Cognition and Memory: Cognition and memory normal.        Judgment: Judgment normal.     Comments: Insight intact     Lab Review:     Component Value Date/Time   NA 137 10/06/2019 1534   K 4.0 10/06/2019 1534   CL 103 10/06/2019 1534   CO2 24 10/06/2019 1534   GLUCOSE 107 (H) 10/06/2019 1534   BUN 12 10/06/2019 1534   CREATININE 1.39 (H) 10/06/2019 1534   CALCIUM 9.6 10/06/2019 1534   PROT  8.7 (H) 07/20/2015 1129   ALBUMIN 4.9 07/20/2015 1129   AST 37 07/20/2015 1129   ALT 51 07/20/2015 1129   ALKPHOS  79 07/20/2015 1129   BILITOT 0.6 07/20/2015 1129   GFRNONAA 59 (L) 10/06/2019 1534   GFRAA >60 10/06/2019 1534       Component Value Date/Time   WBC 3.4 (L) 09/15/2016 0500   RBC 4.37 (L) 09/15/2016 0500   HGB 13.3 09/15/2016 0500   HCT 38.7 (L) 09/15/2016 0500   PLT 242 09/15/2016 0500   MCV 88.6 09/15/2016 0500   MCH 30.4 09/15/2016 0500   MCHC 34.3 09/15/2016 0500   RDW 13.1 09/15/2016 0500   LYMPHSABS 2.3 07/20/2015 1129   MONOABS 0.2 07/20/2015 1129   EOSABS 0.1 07/20/2015 1129   BASOSABS 0.0 07/20/2015 1129    Lithium Lvl  Date Value Ref Range Status  10/06/2019 0.53 (L) 0.60 - 1.20 mmol/L Final    Comment:    Performed at [facility], [address] [place], [place] [address]    Normal TSH 0.851   No results found for: PHENYTOIN, PHENOBARB, VALPROATE, CBMZ   .res Assessment: Plan:    Lithium-induced tremor  Bipolar I disorder (HCC) - Plan: QUEtiapine (SEROQUEL) 300 MG tablet, Lithium level  Obstructive sleep apnea - Plan: Armodafinil (NUVIGIL) 250 MG tablet  Generalized anxiety disorder - Plan: ALPRAZolam (XANAX) 0.5 MG tablet  Insomnia due to mental condition - Plan: Eszopiclone 3 MG TABS  Lithium use   Greater than 50% of 30 min face to face time with patient was spent on counseling and coordination of care. We discussed his diagnoses and answered questions re: bipolar dx, meds, SE. he has received additional parent benefit and mood consistency with the addition of the lithium.  He is tolerating in the dosage at 900 mg a day but did not tolerate 1200 mg a day very well.  The lithium tremor has been reduced over time.  If he has further problems with depression and irritability we could increase the dose to 1050 instead of 1200.  Discussed lithium jerks and tremors and reassured him that they are not permanent and are not associated with tardive dyskinesia.  Overall mood stability is ok.   He wanted tor try switch to Vraylar for less meds.  He  has had some restlessness with the Vraylar 1.5 mg daily as well as some insomnia.  No current restlessness. Stop Vraylar bc not likely doing anything.  Consider retry it later at 3 mg as he might tolerate it better with 2 nd trial if don't reduce Seroquel immediately. Discussed potential metabolic side effects associated with atypical antipsychotics, as well as potential risk for movement side effects. Advised pt to contact office if movement side effects occur.   Counseled patient regarding potential benefits, risks, and side effects of Lamictal to include potential risk of Stevens-Johnson syndrome. Advised patient to stop taking Lamictal and contact office immediately if rash develops and to seek urgent medical attention if rash is severe and/or spreading quickly.  Also emphasized the need for conisistency with lamotrigine dosing otherwise there is an increased risk of rash.  Watch alcohol.  Disc with him.  Disc alcohol and dehydration and risk lithium toxicity .  He is getting benefit from the [facility] for alertness.  Related to sleep apnea  Counseled patient regarding potential benefits, risks, and side effects of lithium to include potential risk of lithium affecting thyroid and renal function.  Discussed need for periodic lab monitoring to  determine drug level and to assess for potential adverse effects.  Counseled patient regarding signs and symptoms of lithium toxicity and advised that they notify office immediately or seek urgent medical attention if experiencing these signs and symptoms.  Patient advised to contact office with any questions or concerns.  Repeat Serum lithium level.  Lithium is low normal bc he reduced it for tremor.  May need to increase it back up.  Other labs are unremarkable. Disc reasons for getting the level in detail.    Continue Seroquel 450mg  nightly to improve his sleep which should also help with mood stability.  Had mania at lower dose.  Polypharmacy in hopes  lithium alone will be sufficient at preventing mania.  Renew FMLA to have leave for episodic flares of condition intermittent estimated 1-5 days, frequency approximately every 2 mos.   Discussed the risks and in his case necessity of polypharmacy.  He is agreeable to continue the current meds.  FU 51mos  Lynder Parents, MD, DFAPA   Please see After Visit Summary for patient specific instructions.  No future appointments.  Orders Placed This Encounter  Procedures  . Lithium level      -------------------------------

## 2020-02-01 DIAGNOSIS — Z0289 Encounter for other administrative examinations: Secondary | ICD-10-CM

## 2020-02-10 ENCOUNTER — Other Ambulatory Visit: Payer: Self-pay | Admitting: Psychiatry

## 2020-05-11 ENCOUNTER — Encounter: Payer: Self-pay | Admitting: Psychiatry

## 2020-05-11 ENCOUNTER — Telehealth (INDEPENDENT_AMBULATORY_CARE_PROVIDER_SITE_OTHER): Payer: 59 | Admitting: Psychiatry

## 2020-05-11 DIAGNOSIS — G251 Drug-induced tremor: Secondary | ICD-10-CM

## 2020-05-11 DIAGNOSIS — F411 Generalized anxiety disorder: Secondary | ICD-10-CM

## 2020-05-11 DIAGNOSIS — G4733 Obstructive sleep apnea (adult) (pediatric): Secondary | ICD-10-CM | POA: Diagnosis not present

## 2020-05-11 DIAGNOSIS — F5105 Insomnia due to other mental disorder: Secondary | ICD-10-CM

## 2020-05-11 DIAGNOSIS — F319 Bipolar disorder, unspecified: Secondary | ICD-10-CM | POA: Diagnosis not present

## 2020-05-11 DIAGNOSIS — Z79899 Other long term (current) drug therapy: Secondary | ICD-10-CM

## 2020-05-11 NOTE — Progress Notes (Signed)
Michael Mclean [id number] 04/15/1971 49 y.o.  Video Visit via My Chart  I connected with pt by My Chart and verified that I am speaking with the correct person using two identifiers.   I discussed the limitations, risks, security and privacy concerns of performing an evaluation and management service by My Chart  and the availability of in person appointments. I also discussed with the patient that there may be a patient responsible charge related to this service. The patient expressed understanding and agreed to proceed.  I discussed the assessment and treatment plan with the patient. The patient was provided an opportunity to ask questions and all were answered. The patient agreed with the plan and demonstrated an understanding of the instructions.   The patient was advised to call back or seek an in-person evaluation if the symptoms worsen or if the condition fails to improve as anticipated.  I provided 30 minutes of video time during this encounter.  The patient was located at home and the provider was located office.    Subjective:   Patient ID:  Michael Mclean is a 49 y.o. (DOB 04/15/1971) male.  Chief Complaint:  Chief Complaint  Patient presents with   Follow-up   Anxiety   Depression   Sleeping Problem    Medication Refill Pertinent negatives include no chest pain or weakness.  Anxiety Patient reports no chest pain, confusion, decreased concentration, dizziness, nervous/anxious behavior, palpitations or suicidal ideas.    Depression        Associated symptoms include no decreased concentration and no suicidal ideas.  Past medical history includes anxiety.    Michael Mclean is due for follow-up of bipolar disorder.  seen August 11, 2019.  Due to erectile dysfunction Seroquel XR was reduced to 600 mg daily from 800 mg daily.  Also lithium level and labs were ordered.  Last seen October 06, 2019.  Serum lithium level, TSH, BMP.  Check it.  He wants to go now  this afternoon.  It will not be a trough but it is hard for him to get off of work.  We will expect a lower than usual blood level. Start Vraylar 1.5 mg 1 capsule daily Reduce Seroquel XR to 1 1/2 nightly for 1 week then 1 nightly  Last seen November 17, 2019.  The following changes were noted: Reduce Vraylar 1.5 mg to every other daily DT restlessness change Seroquel 300mg   1 nightly to improve his sleep which should also help with mood stability.  He called January 15 complaining of insomnia.  Had taken up to 6 Lunesta and 1 night!  He was informed this was dangerous and not to do so again.  He was instructed to increase the quetiapine by an extra half tablet and to increase the Vraylar to 3 mg daily.  It was felt he was having some manic symptoms.  01/26/2020 appointment with the following noted: Admits he had manic sx in January.  Resolved with med changes.  Had to increase the Seroquel to control mania.   Mania had affected work and insomnia.  Tried Vraylar in hopes of being able to reduce eliminate Seroquel.  Was missing work, but not now.  But having problem with FMLA with new supervisor.  Says the episodes have been up to 5 days but the FMLA says leaves would be anticipated for 7-14 days.    Usual Xanax is 2 tablets every 3 days for anxiety. Pretty good.  Job is still really stressful and only real concern. .Marland Kitchen  Lasts 3 mos.  Job is very stressful. And volume demands.  Lives in [place]. Married June 2020 and right after fell apart for 3-5 days.  Lost control for a little but normal again now.   Seroquel and lithium work well for sleep and mood.  No problems with reduction in Seroquel in mood and the sexual SE are better.  Used Viagra twice. He reduced the lithium back to 900 mg bc felt like it was all that was necessary.  A little scared to increase the lithium bc hand jerks at times.  Some tremor.  Scares him.  Little anger an impulsivity at this time but is a little.  Notices he can't  drink much with the lithiium.  When drinks he does too much and may not sleep and then has a really negative effect on him. Lithium has helped manage with less anger and impulsivity.  Pleased with the response.  Did not have this level of control before.  Wants to try to get on less meds and his fiancee' is suggesting it.  Feels guilty about being dependent on the meds.  Plan: It was felt [name] was probably not having significant clinical effect and in order to reduce polypharmacy it was discontinued.  05/11/2020 appointment with the following noted: Taking Xanax prn.  Disc FMLA amendment helpful.  Tolerating meds.   Overall doing relatively well.  One period when couldn't work for 2-3 days DT anxiety and irritability.  Otherwise better with change lifestyle with marriage and better routine. No problems off the Vraylar.  The increase Seroquel in the past helped sleep and mood and only needed Lunesta 2-3 times since here.  Work function is typical.  Remains stressful job.  Expects that and handling it a little better.  Better self confidence.   Wife also started working at [employer] too.    Patient reports stable mood and denies depressed or irritable moods usually.  Patient has a little more recent difficulty with anxiety.   Denies appetite disturbance.  Patient reports that energy and motivation have been good.  Patient denies any difficulty with concentration.  Patient denies any suicidal ideation.  No longer drinks too much.  He sees benefit.  [name] says he's well.    Past Psychiatric Medication Trials:  Depakote paranoia, Nuvigil, topiramate, Seroquel 800, Wellbutrin, lamotrigine, Xanax, Sonata, Viibryd, trazodone 400 Abilify 20 amitriptyline Ambien, Vraylar 3 mg restless and couldn't sleep   Review of Systems:  Review of Systems  Cardiovascular: Negative for chest pain and palpitations.  Neurological: Positive for tremors. Negative for dizziness and weakness.  Psychiatric/Behavioral:  Positive for depression. Negative for agitation, behavioral problems, confusion, decreased concentration, dysphoric mood, hallucinations, self-injury, sleep disturbance and suicidal ideas. The patient is not nervous/anxious and is not hyperactive.     Medications: I have reviewed the patient's current medications.  Current Outpatient Medications  Medication Sig Dispense Refill   ALPRAZolam (XANAX) 0.5 MG tablet Take 1 tablet (0.5 mg) three times daily, as needed. 270 tablet 0   Armodafinil (NUVIGIL) 250 MG tablet Take 1 tablet (250 mg total) by mouth 2 (two) times daily. 180 tablet 0   atorvastatin (LIPITOR) 40 MG tablet Take 40 mg by mouth daily.     Eszopiclone 3 MG TABS Take 1 tablet by mouth immediately before bedtime 30 tablet 1   ibuprofen (ADVIL,MOTRIN) 400 MG tablet Take 1 tablet (400 mg total) by mouth every 6 (six) hours as needed for moderate pain. 12 tablet 0   lamoTRIgine (  LAMICTAL) 150 MG tablet TAKE 1 TABLET TWICE A DAY 180 tablet 3   lithium carbonate 300 MG capsule Take 3 capsules (900 mg total) by mouth daily. 270 capsule 0   QUEtiapine (SEROQUEL) 300 MG tablet Take 1.5 tablets (450 mg total) by mouth at bedtime. 135 tablet 0   sildenafil (VIAGRA) 100 MG tablet Take by mouth.     valACYclovir (VALTREX) 500 MG tablet Take 500 mg by mouth. Takes for 5 days     No current facility-administered medications for this visit.    Medication Side Effects: None sexual SE are better  Allergies: No Known Allergies  Past Medical History:  Diagnosis Date   Anxiety disorder    Bipolar 1 disorder (HCC)    Hyperlipidemia    Hypertension     History reviewed. No pertinent family history.  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  Tobacco Use   Smoking status: Never Smoker   Smokeless tobacco: Never Used  Substance and Sexual Activity   Alcohol use: Yes   Drug use: No   Sexual activity: Not on file  Other Topics Concern   Not on file  Social History Narrative   Not on file   Social Determinants of Health   Financial Resource Strain:    Difficulty of Paying Living Expenses:   Food Insecurity:    Worried About Programme researcher, broadcasting/film/video in the Last Year:    Barista in the Last Year:   Transportation Needs:    Freight forwarder (Medical):    Lack of Transportation (Non-Medical):   Physical Activity:    Days of Exercise per Week:    Minutes of Exercise per Session:   Stress:    Feeling of Stress :   Social Connections:    Frequency of Communication with Friends and Family:    Frequency of Social Gatherings with Friends and Family:    Attends Religious Services:    Active Member of Clubs or Organizations:    Attends Engineer, structural:    Marital Status:   Intimate Partner Violence:    Fear of Current or Ex-Partner:    Emotionally Abused:    Physically Abused:    Sexually Abused:     Past Medical History, Surgical history, Social history, and Family history were reviewed and updated as appropriate.   Please see review of systems for further details on the patient's review from today.   Objective:   Physical Exam:  There were no vitals taken for this visit.  Physical Exam Neurological:     Mental Status: He is alert and oriented to person, place, and time.     Cranial Nerves: No dysarthria.  Psychiatric:        Attention and Perception: Attention and perception normal.        Mood and Affect: Mood is anxious. Mood is not depressed. Affect is not labile or tearful.        Speech: Speech normal. Speech is not rapid and pressured.        Behavior: Behavior is cooperative.        Thought Content: Thought content normal. Thought  content is not paranoid or delusional. Thought  content does not include homicidal or suicidal ideation. Thought content does not include homicidal or suicidal plan.        Cognition and Memory: Cognition and memory normal.        Judgment: Judgment normal.     Comments: Insight intact     Lab Review:     Component Value Date/Time   NA 137 10/06/2019 1534   K 4.0 10/06/2019 1534   CL 103 10/06/2019 1534   CO2 24 10/06/2019 1534   GLUCOSE 107 (H) 10/06/2019 1534   BUN 12 10/06/2019 1534   CREATININE 1.39 (H) 10/06/2019 1534   CALCIUM 9.6 10/06/2019 1534   PROT 8.7 (H) 07/20/2015 1129   ALBUMIN 4.9 07/20/2015 1129   AST 37 07/20/2015 1129   ALT 51 07/20/2015 1129   ALKPHOS 79 07/20/2015 1129   BILITOT 0.6 07/20/2015 1129   GFRNONAA 59 (L) 10/06/2019 1534   GFRAA >60 10/06/2019 1534       Component Value Date/Time   WBC 3.4 (L) 09/15/2016 0500   RBC 4.37 (L) 09/15/2016 0500   HGB 13.3 09/15/2016 0500   HCT 38.7 (L) 09/15/2016 0500   PLT 242 09/15/2016 0500   MCV 88.6 09/15/2016 0500   MCH 30.4 09/15/2016 0500   MCHC 34.3 09/15/2016 0500   RDW 13.1 09/15/2016 0500   LYMPHSABS 2.3 07/20/2015 1129   MONOABS 0.2 07/20/2015 1129   EOSABS 0.1 07/20/2015 1129   BASOSABS 0.0 07/20/2015 1129    Lithium Lvl  Date Value Ref Range Status  10/06/2019 0.53 (L) 0.60 - 1.20 mmol/L Final    Comment:    Performed at [facility], [address] [place], [place] [address]    Normal TSH 0.851   No results found for: PHENYTOIN, PHENOBARB, VALPROATE, CBMZ   .res Assessment: Plan:    Bipolar I disorder (HCC)  Generalized anxiety disorder  Obstructive sleep apnea  Lithium-induced tremor  Lithium use  Insomnia due to mental condition   Greater than 50% of 30 min face to face time with patient was spent on counseling and coordination of care. We discussed his diagnoses and answered questions re: bipolar dx, meds, SE. he has received additional parent benefit and  mood consistency with the addition of the lithium.  He is tolerating in the dosage at 900 mg a day but did not tolerate 1200 mg a day very well.  The lithium tremor has been reduced over time.  If he has further problems with depression and irritability we could increase the dose to 1050 instead of 1200.  Discussed lithium jerks and tremors and reassured him that they are not permanent and are not associated with tardive dyskinesia.  Overall mood stability is ok.   Option Vraylar 3 though 1.5 mg caused SE. Consider retry it later at 3 mg as he might tolerate it better with 2 nd trial if don't reduce Seroquel immediately. Discussed potential metabolic side effects associated with atypical antipsychotics, as well as potential risk for movement side effects. Advised pt to contact office if movement side effects occur.   We discussed the short-term risks associated with benzodiazepines including sedation and increased fall risk among others.  Discussed long-term side effect risk including dependence, potential withdrawal symptoms, and the potential eventual dose-related risk of dementia.  But recent studies from 2020 dispute this association between benzodiazepines and dementia risk. Newer studies in 2020 do not support an association with dementia.  But did discuss risk of affecting memory in daytime.  Counseled  patient regarding potential benefits, risks, and side effects of Lamictal to include potential risk of Stevens-Johnson syndrome. Advised patient to stop taking Lamictal and contact office immediately if rash develops and to seek urgent medical attention if rash is severe and/or spreading quickly.  Also emphasized the need for conisistency with lamotrigine dosing otherwise there is an increased risk of rash.  Disc potential effect on B vitamin levels.   Watch alcohol.  Disc with him.  Disc alcohol and dehydration and risk lithium toxicity .  He is getting benefit from the [facility] for alertness.   Related to sleep apnea  Counseled patient regarding potential benefits, risks, and side effects of lithium to include potential risk of lithium affecting thyroid and renal function.  Discussed need for periodic lab monitoring to determine drug level and to assess for potential adverse effects.  Counseled patient regarding signs and symptoms of lithium toxicity and advised that they notify office immediately or seek urgent medical attention if experiencing these signs and symptoms.  Patient advised to contact office with any questions or concerns.  Disc the off-label use of N-Acetylcysteine at 600 mg daily to help with mild cognitive problems.  It can be combined with a B-complex vitamin as the B-12 and folate have been shown to sometimes enhance the effect. Try for 2 mos for STM concerns.  Repeat Serum lithium level.  He hasn't done it. Says it's inconvenient but he will check and let us know where to send the request.  Lithium is low normal bc he reduced it for tremor.  May need to increase it back up.  Other labs are unremarkable. Disc reasons for getting the level in detail.    Continue Seroquel 450mg  nightly to improve his sleep which should also help with mood stability.  Had mania at lower dose.  Polypharmacy in hopes lithium alone will be sufficient at preventing mania.  Continue FMLA to have leave for episodic flares of condition intermittent estimated 1-5 days, frequency approximately every 2 mos.  This satisfied his work.  Discussed the risks and in his case necessity of polypharmacy.  He is agreeable to continue the current meds.  FU 3 mos  Lynder Parents, MD, DFAPA   Please see After Visit Summary for patient specific instructions.  No future appointments.  No orders of the defined types were placed in this encounter.     -------------------------------

## 2020-05-11 NOTE — Patient Instructions (Signed)
Disc the off-label use of N-Acetylcysteine at 600 mg daily to help with mild cognitive problems.  It can be combined with a B-complex vitamin as the B-12 and folate have been shown to sometimes enhance the effect.  

## 2020-05-25 ENCOUNTER — Other Ambulatory Visit: Payer: Self-pay | Admitting: Psychiatry

## 2020-06-02 ENCOUNTER — Other Ambulatory Visit: Payer: Self-pay | Admitting: Psychiatry

## 2020-06-02 DIAGNOSIS — F319 Bipolar disorder, unspecified: Secondary | ICD-10-CM

## 2020-06-20 ENCOUNTER — Telehealth: Payer: Self-pay | Admitting: Psychiatry

## 2020-06-20 NOTE — Telephone Encounter (Signed)
Michael Mclean called about his blood work. He said he can get it done at [facility] in [place], [place].He got it done last year at this location. He just wanted me to inform the nurse.

## 2020-06-21 ENCOUNTER — Other Ambulatory Visit: Payer: Self-pay | Admitting: Psychiatry

## 2020-06-21 DIAGNOSIS — Z79899 Other long term (current) drug therapy: Secondary | ICD-10-CM

## 2020-06-21 DIAGNOSIS — F319 Bipolar disorder, unspecified: Secondary | ICD-10-CM

## 2020-06-21 NOTE — Telephone Encounter (Signed)
I sent the lab request to [facility].  Because [place] is a part of the system it should be visible through epic.

## 2020-06-26 NOTE — Telephone Encounter (Signed)
Left detailed message okay for him to get his labs with [facility], orders in.

## 2020-06-29 ENCOUNTER — Telehealth: Payer: Self-pay | Admitting: Psychiatry

## 2020-06-29 ENCOUNTER — Other Ambulatory Visit: Payer: Self-pay | Admitting: Psychiatry

## 2020-06-29 DIAGNOSIS — Z79899 Other long term (current) drug therapy: Secondary | ICD-10-CM

## 2020-06-29 DIAGNOSIS — F319 Bipolar disorder, unspecified: Secondary | ICD-10-CM

## 2020-06-29 NOTE — Telephone Encounter (Signed)
I sent this to [facility].  Since [facility] is [facility] it should be visible to them.

## 2020-06-29 NOTE — Telephone Encounter (Signed)
Pt called, he said that [facility] doesn't have his orders for blood work. Can this be resent to them?

## 2020-08-27 ENCOUNTER — Telehealth (INDEPENDENT_AMBULATORY_CARE_PROVIDER_SITE_OTHER): Payer: 59 | Admitting: Psychiatry

## 2020-08-27 ENCOUNTER — Encounter: Payer: Self-pay | Admitting: Psychiatry

## 2020-08-27 DIAGNOSIS — G251 Drug-induced tremor: Secondary | ICD-10-CM

## 2020-08-27 DIAGNOSIS — F319 Bipolar disorder, unspecified: Secondary | ICD-10-CM | POA: Diagnosis not present

## 2020-08-27 DIAGNOSIS — F5105 Insomnia due to other mental disorder: Secondary | ICD-10-CM

## 2020-08-27 DIAGNOSIS — F411 Generalized anxiety disorder: Secondary | ICD-10-CM

## 2020-08-27 DIAGNOSIS — Z79899 Other long term (current) drug therapy: Secondary | ICD-10-CM

## 2020-08-27 DIAGNOSIS — G4733 Obstructive sleep apnea (adult) (pediatric): Secondary | ICD-10-CM | POA: Diagnosis not present

## 2020-08-27 MED ORDER — LITHIUM CARBONATE 300 MG PO CAPS: 900.0000 mg | ORAL_CAPSULE | Freq: Every day | ORAL | 0 refills | Status: DC

## 2020-08-27 NOTE — Progress Notes (Signed)
Michael Mclean [id number] Nov 28, 1970 49 y.o.  Video Visit via My Chart  I connected with pt by My Chart and verified that I am speaking with the correct person using two identifiers.   I discussed the limitations, risks, security and privacy concerns of performing an evaluation and management service by My Chart  and the availability of in person appointments. I also discussed with the patient that there may be a patient responsible charge related to this service. The patient expressed understanding and agreed to proceed.  I discussed the assessment and treatment plan with the patient. The patient was provided an opportunity to ask questions and all were answered. The patient agreed with the plan and demonstrated an understanding of the instructions.   The patient was advised to call back or seek an in-person evaluation if the symptoms worsen or if the condition fails to improve as anticipated.  I provided 30 minutes of video time during this encounter.  The patient was located at home and the provider was located office.    Subjective:   Patient ID:  Michael Mclean is a 49 y.o. (DOB 09-12-1971) male.  Chief Complaint:  Chief Complaint  Patient presents with  . Follow-up    Medication Management  . Other    Bipolar    Medication Refill Pertinent negatives include no chest pain or weakness.  Anxiety Patient reports no chest pain, confusion, decreased concentration, dizziness, nervous/anxious behavior, palpitations or suicidal ideas.    Depression        Associated symptoms include no decreased concentration and no suicidal ideas.  Past medical history includes anxiety.    Michael Mclean is due for follow-up of bipolar disorder.  seen August 11, 2019.  Due to erectile dysfunction Seroquel XR was reduced to 600 mg daily from 800 mg daily.  Also lithium level and labs were ordered.  seen October 06, 2019.  Serum lithium level, TSH, BMP.  Check it.  He wants to go now this  afternoon.  It will not be a trough but it is hard for him to get off of work.  We will expect a lower than usual blood level. Start Vraylar 1.5 mg 1 capsule daily Reduce Seroquel XR to 1 1/2 nightly for 1 week then 1 nightly  seen November 17, 2019.  The following changes were noted: Reduce Vraylar 1.5 mg to every other daily DT restlessness change Seroquel 300mg   1 nightly to improve his sleep which should also help with mood stability.  He called January 15 complaining of insomnia.  Had taken up to 6 Lunesta and 1 night!  He was informed this was dangerous and not to do so again.  He was instructed to increase the quetiapine by an extra half tablet and to increase the Vraylar to 3 mg daily.  It was felt he was having some manic symptoms.  01/26/2020 appointment with the following noted: Admits he had manic sx in January.  Resolved with med changes.  Had to increase the Seroquel to control mania.   Mania had affected work and insomnia.  Tried Vraylar in hopes of being able to reduce eliminate Seroquel.  Was missing work, but not now.  But having problem with FMLA with new supervisor.  Says the episodes have been up to 5 days but the FMLA says leaves would be anticipated for 7-14 days.    Usual Xanax is 2 tablets every 3 days for anxiety. Pretty good.  Job is still really stressful and only real concern. February  Lasts 3 mos.  Job is very stressful. And volume demands.  [place] in [place]. Married June 2020 and right after fell apart for 3-5 days.  Lost control for a little but normal again now.   Seroquel and lithium work well for sleep and mood.  No problems with reduction in Seroquel in mood and the sexual SE are better.  Used Viagra twice. He reduced the lithium back to 900 mg bc felt like it was all that was necessary.  A little scared to increase the lithium bc hand jerks at times.  Some tremor.  Scares him.  Little anger an impulsivity at this time but is a little.  Notices he can't drink much  with the lithiium.  When drinks he does too much and may not sleep and then has a really negative effect on him. Lithium has helped manage with less anger and impulsivity.  Pleased with the response.  Did not have this level of control before.  Wants to try to get on less meds and his fiancee' is suggesting it.  Feels guilty about being dependent on the meds.  Plan: It was felt Leafy KindleVraylar was probably not having significant clinical effect and in order to reduce polypharmacy it was discontinued.  05/11/2020 appointment with the following noted: Taking Xanax prn.  Disc FMLA amendment helpful.  Tolerating meds.   Overall doing relatively well.  One period when couldn't work for 2-3 days DT anxiety and irritability.  Otherwise better with change lifestyle with marriage and better routine. No problems off the Vraylar.  The increase Seroquel in the past helped sleep and mood and only needed Lunesta 2-3 times since here.  Work function is typical.  Remains stressful job.  Expects that and handling it a little better.  Better self confidence.   Wife also started working at [employer] too.   Plan: no med changes  08/27/20 appt with following noted: Episode early July after out of lamotrigine for several weeks.  Got in huge fight with wife and missed a week of work.  Otherwise is OK since then.   Out of Armodafinil now for 4-6 weeks.  Managing with mild sleepiness.  Sleep is OK usually with prn. Minimal tremor. Patient reports stable mood and denies depressed or irritable moods usually.  Patient has a little more recent difficulty with anxiety.   Denies appetite disturbance.  Patient reports that energy and motivation have been good.  Patient denies any difficulty with concentration.  Patient denies any suicidal ideation.  No longer drinks too much.  He sees benefit.  Bethann PunchesFianacee says he's well.    Past Psychiatric Medication Trials:  Depakote paranoia, topiramate, Seroquel 800, lamotrigine, Abilify 20 , Vraylar 3  mg restless and couldn't sleep, lithium 1200 Nuvigil,  Wellbutrin, Viibryd,  trazodone 400,  Xanax, Sonata, amitriptyline Ambien,     Review of Systems:  Review of Systems  Cardiovascular: Negative for chest pain and palpitations.  Neurological: Positive for tremors. Negative for dizziness and weakness.  Psychiatric/Behavioral: Positive for depression. Negative for agitation, behavioral problems, confusion, decreased concentration, dysphoric mood, hallucinations, self-injury, sleep disturbance and suicidal ideas. The patient is not nervous/anxious and is not hyperactive.     Medications: I have reviewed the patient's current medications.  Current Outpatient Medications  Medication Sig Dispense Refill  . ALPRAZolam (XANAX) 0.5 MG tablet Take 1 tablet (0.5 mg) three times daily, as needed. 270 tablet 0  . atorvastatin (LIPITOR) 40 MG tablet Take 40 mg by mouth daily.    .Marland Kitchen  Eszopiclone 3 MG TABS Take 1 tablet by mouth immediately before bedtime 30 tablet 1  . ibuprofen (ADVIL,MOTRIN) 400 MG tablet Take 1 tablet (400 mg total) by mouth every 6 (six) hours as needed for moderate pain. 12 tablet 0  . lamoTRIgine (LAMICTAL) 150 MG tablet TAKE 1 TABLET TWICE A DAY 180 tablet 3  . lithium carbonate 300 MG capsule Take 3 capsules (900 mg total) by mouth daily. 270 capsule 0  . QUEtiapine (SEROQUEL) 300 MG tablet TAKE ONE AND ONE-HALF TABLETS AT BEDTIME 135 tablet 3  . sildenafil (VIAGRA) 100 MG tablet Take by mouth.    . valACYclovir (VALTREX) 500 MG tablet Take 500 mg by mouth. Takes for 5 days     No current facility-administered medications for this visit.    Medication Side Effects: None sexual SE are better  Allergies: No Known Allergies  Past Medical History:  Diagnosis Date  . Anxiety disorder   . Bipolar 1 disorder (HCC)   . Hyperlipidemia   . Hypertension     History reviewed. No pertinent family history.  Social History   Socioeconomic History  . Marital status: Single     Spouse name: Not on file  . Number of children: Not on file  . Years of education: Not on file  . Highest education level: Not on file  Occupational History  . Occupation: works for [employer] for [employer]  Tobacco Use  . Smoking status: Never Smoker  . Smokeless tobacco: Never Used  Substance and Sexual Activity  . Alcohol use: Yes  . Drug use: No  . Sexual activity: Not on file  Other Topics Concern  . Not on file  Social History Narrative  . Not on file   Social Determinants of Health   Financial Resource Strain:   . Difficulty of Paying Living Expenses: Not on file  Food Insecurity:   . Worried About Programme researcher, broadcasting/film/video in the Last Year: Not on file  . Ran Out of Food in the Last Year: Not on file  Transportation Needs:   . Lack of Transportation (Medical): Not on file  . Lack of Transportation (Non-Medical): Not on file  Physical Activity:   . Days of Exercise per Week: Not on file  . Minutes of Exercise per Session: Not on file  Stress:   . Feeling of Stress : Not on file  Social Connections:   . Frequency of Communication with Friends and Family: Not on file  . Frequency of Social Gatherings with Friends and Family: Not on file  . Attends Religious Services: Not on file  . Active Member of Clubs or Organizations: Not on file  . Attends Banker Meetings: Not on file  . Marital Status: Not on file  Intimate Partner Violence:   . Fear of Current or Ex-Partner: Not on file  . Emotionally Abused: Not on file  . Physically Abused: Not on file  . Sexually Abused: Not on file    Past Medical History, Surgical history, Social history, and Family history were reviewed and updated as appropriate.   Please see review of systems for further details on the patient's review from today.   Objective:   Physical Exam:  There were no vitals taken for this visit.  Physical Exam Neurological:     Mental Status: He is alert and oriented to  person, place, and time.     Cranial Nerves: No dysarthria.  Psychiatric:  Attention and Perception: Attention and perception normal.        Mood and Affect: Mood is not anxious or depressed. Affect is not labile or tearful.        Speech: Speech normal. Speech is not rapid and pressured.        Behavior: Behavior is cooperative.        Thought Content: Thought content normal. Thought content is not paranoid or delusional. Thought content does not include homicidal or suicidal ideation. Thought content does not include homicidal or suicidal plan.        Cognition and Memory: Cognition and memory normal.        Judgment: Judgment normal.     Comments: Insight intact Overall mood is OK.      Lab Review:     Component Value Date/Time   NA 137 10/06/2019 1534   K 4.0 10/06/2019 1534   CL 103 10/06/2019 1534   CO2 24 10/06/2019 1534   GLUCOSE 107 (H) 10/06/2019 1534   BUN 12 10/06/2019 1534   CREATININE 1.39 (H) 10/06/2019 1534   CALCIUM 9.6 10/06/2019 1534   PROT 8.7 (H) 07/20/2015 1129   ALBUMIN 4.9 07/20/2015 1129   AST 37 07/20/2015 1129   ALT 51 07/20/2015 1129   ALKPHOS 79 07/20/2015 1129   BILITOT 0.6 07/20/2015 1129   GFRNONAA 59 (L) 10/06/2019 1534   GFRAA >60 10/06/2019 1534       Component Value Date/Time   WBC 3.4 (L) 09/15/2016 0500   RBC 4.37 (L) 09/15/2016 0500   HGB 13.3 09/15/2016 0500   HCT 38.7 (L) 09/15/2016 0500   PLT 242 09/15/2016 0500   MCV 88.6 09/15/2016 0500   MCH 30.4 09/15/2016 0500   MCHC 34.3 09/15/2016 0500   RDW 13.1 09/15/2016 0500   LYMPHSABS 2.3 07/20/2015 1129   MONOABS 0.2 07/20/2015 1129   EOSABS 0.1 07/20/2015 1129   BASOSABS 0.0 07/20/2015 1129    Lithium Lvl  Date Value Ref Range Status  10/06/2019 0.53 (L) 0.60 - 1.20 mmol/L Final    Comment:    Performed at [facility], [address] [place], [place] [address]    Normal TSH 0.851   No results found for: PHENYTOIN, PHENOBARB, VALPROATE, CBMZ    .res Assessment: Plan:    Bipolar I disorder (HCC) - Plan: Basic metabolic panel, Lithium level, TSH, lithium carbonate 300 MG capsule  Generalized anxiety disorder  Obstructive sleep apnea  Lithium-induced tremor  Insomnia due to mental condition  Lithium use - Plan: Basic metabolic panel, Lithium level, TSH   Greater than 50% of 30 min face to face time with patient was spent on counseling and coordination of care. We discussed his diagnoses and answered questions re: bipolar dx, meds, SE. he has received additional parent benefit and mood consistency with the addition of the lithium.  He is tolerating in the dosage at 900 mg a day but did not tolerate 1200 mg a day very well.  The lithium tremor has been reduced over time.  If he has further problems with depression and irritability we could increase the dose to 1050 instead of 1200.  Discussed lithium jerks and tremors and reassured him that they are not permanent and are not associated with tardive dyskinesia.  Overall mood stability is ok unless non compliant in July.  Emphasized importance of consistency. Option Vraylar 3 though 1.5 mg caused SE. Consider retry it later at 3 mg as he might tolerate it better with  2 nd trial if don't reduce Seroquel immediately. Discussed potential metabolic side effects associated with atypical antipsychotics, as well as potential risk for movement side effects. Advised pt to contact office if movement side effects occur.   We discussed the short-term risks associated with benzodiazepines including sedation and increased fall risk among others.  Discussed long-term side effect risk including dependence, potential withdrawal symptoms, and the potential eventual dose-related risk of dementia.  But recent studies from 2020 dispute this association between benzodiazepines and dementia risk. Newer studies in 2020 do not support an association with dementia.  But did discuss risk of affecting memory in  daytime.  Counseled patient regarding potential benefits, risks, and side effects of Lamictal to include potential risk of Stevens-Johnson syndrome. Advised patient to stop taking Lamictal and contact office immediately if rash develops and to seek urgent medical attention if rash is severe and/or spreading quickly.  Also emphasized the need for conisistency with lamotrigine dosing otherwise there is an increased risk of rash.  Disc potential effect on B vitamin levels.   Watch alcohol.  Disc with him.  Disc alcohol and dehydration and risk lithium toxicity .  Done ok without Nuvigil lately.  Counseled patient regarding potential benefits, risks, and side effects of lithium to include potential risk of lithium affecting thyroid and renal function.  Discussed need for periodic lab monitoring to determine drug level and to assess for potential adverse effects.  Counseled patient regarding signs and symptoms of lithium toxicity and advised that they notify office immediately or seek urgent medical attention if experiencing these signs and symptoms.  Patient advised to contact office with any questions or concerns.  Repeat Serum lithium level.  He hasn't done it. Sent to [facility]. Lithium is low normal bc he reduced it for tremor.  May need to increase it back up.  Other labs are unremarkable. Disc reasons for getting the level in detail.   He's open to increasing the dose if needed.  Continue Seroquel  nightly to improve his sleep which should also help with mood stability.  Had mania at lower dose.  Polypharmacy in hopes lithium alone will be sufficient at preventing mania.  Continue FMLA to have leave for episodic flares of condition intermittent estimated 1-5 days, frequency approximately every 2 mos.  This satisfied his work.  Discussed the risks and in his case necessity of polypharmacy.  He is agreeable to continue the current meds.  FU 3 mos  Meredith Staggers, MD, DFAPA   Please see  After Visit Summary for patient specific instructions.  No future appointments.  Orders Placed This Encounter  Procedures  . Basic metabolic panel  . Lithium level  . TSH      -------------------------------

## 2020-09-11 ENCOUNTER — Other Ambulatory Visit: Payer: Self-pay

## 2020-09-11 ENCOUNTER — Telehealth: Payer: Self-pay | Admitting: Psychiatry

## 2020-09-11 DIAGNOSIS — F319 Bipolar disorder, unspecified: Secondary | ICD-10-CM

## 2020-09-11 MED ORDER — QUETIAPINE FUMARATE 300 MG PO TABS: ORAL_TABLET | ORAL | 1 refills | Status: DC

## 2020-09-11 NOTE — Telephone Encounter (Signed)
Pt cannot afford the 90 day RX yet with [facility]. Can we call in a 30 day RX of Seroquel for him to pick up locally? He is hoping it will be less expensive. Has to wait to pay for the other rx at [facility]. [facility] [place] [place] [facility].

## 2020-09-11 NOTE — Telephone Encounter (Signed)
[facility] day [facility] for [facility]apine [facility]

## 2020-10-04 ENCOUNTER — Other Ambulatory Visit: Payer: Self-pay | Admitting: Psychiatry

## 2020-10-04 DIAGNOSIS — F319 Bipolar disorder, unspecified: Secondary | ICD-10-CM

## 2020-10-28 LAB — BASIC METABOLIC PANEL
BUN/Creatinine Ratio: 9 (ref 9–20)
BUN: 13 mg/dL (ref 6–24)
CO2: 23 mmol/L (ref 20–29)
Calcium: 9.8 mg/dL (ref 8.7–10.2)
Chloride: 104 mmol/L (ref 96–106)
Creatinine, Ser: 1.39 mg/dL — ABNORMAL HIGH (ref 0.76–1.27)
GFR calc Af Amer: 68 mL/min/{1.73_m2} (ref >59)
GFR calc non Af Amer: 59 mL/min/{1.73_m2} — ABNORMAL LOW (ref >59)
Glucose: 104 mg/dL — ABNORMAL HIGH (ref 65–99)
Potassium: 4.6 mmol/L (ref 3.5–5.2)
Sodium: 140 mmol/L (ref 134–144)

## 2020-10-28 LAB — TSH: TSH: 3.78 u[IU]/mL (ref 0.450–4.500)

## 2020-10-28 LAB — LITHIUM LEVEL: Lithium Lvl: 0.7 mmol/L (ref 0.5–1.2)

## 2020-11-30 ENCOUNTER — Other Ambulatory Visit: Payer: Self-pay | Admitting: Psychiatry

## 2020-11-30 NOTE — Telephone Encounter (Signed)
Call to [facility]

## 2020-12-01 ENCOUNTER — Telehealth: Payer: Self-pay | Admitting: Psychiatry

## 2020-12-01 DIAGNOSIS — F319 Bipolar disorder, unspecified: Secondary | ICD-10-CM

## 2020-12-01 MED ORDER — LAMOTRIGINE 150 MG PO TABS: 150.0000 mg | ORAL_TABLET | Freq: Two times a day (BID) | ORAL | 0 refills | Status: DC

## 2020-12-01 NOTE — Telephone Encounter (Signed)
Received after hours call from patient. He reports that he is out of Lamictal. He reports that his FSA has just renewed and he ordered 90-day supply yesterday and it will . He reports that he has been out of Lamictal for about 5-7 days. He reports that he has not been feeling well today and thinks that it may be due to not having Lamictal. Typically takes Lamictal 150 mg BID. Will send in 2 week supply to local pharmacy to bridge patient until he can receive mail order. Discussed monitoring for rash and contacting office if he develops a rash. Discussed not re-starting high dose of Lamictal if he has not taken Lamictal for more than 5-7 days due to increased risk of SJS. Answered pt's questions re: how SJS typically presents. Patient advised to contact office with any questions, adverse effects, or acute worsening in signs and symptoms.

## 2020-12-05 NOTE — Telephone Encounter (Signed)
LMTCB for appt.

## 2020-12-07 ENCOUNTER — Telehealth: Payer: Self-pay | Admitting: Psychiatry

## 2020-12-07 NOTE — Telephone Encounter (Signed)
Pt left message on v-mail reporting having issues with concentration and absent minded. Apt 3/10 and asking for something before apt to help. contact # [phone]

## 2020-12-24 ENCOUNTER — Telehealth: Payer: Self-pay

## 2020-12-24 ENCOUNTER — Other Ambulatory Visit: Payer: Self-pay | Admitting: Psychiatry

## 2020-12-24 DIAGNOSIS — F5105 Insomnia due to other mental disorder: Secondary | ICD-10-CM

## 2020-12-24 MED ORDER — ESZOPICLONE 3 MG PO TABS: ORAL_TABLET | ORAL | 1 refills | Status: DC

## 2020-12-24 NOTE — Telephone Encounter (Signed)
Put him on cancellation list.   

## 2020-12-24 NOTE — Telephone Encounter (Signed)
Patient called provider on call, Michael Mclean and reports having trouble emotionally, very tearful on phone and upset. Wife did get on phone with Almira Coaster also explaining he's dealing with custody issues and they recently came to a head end of January, and also asking for his FMLA renewal from last January 2021. Not able to work today. 12/24/2020. This is his 4 th day that he's not able to work. Advised him to have his supervisor notify HR to send over his FMLA paperwork.  Patient did leave a message for Dr. Jennelle Human on 12/07/2020 with complaints, not sure there was a follow up call made according to notes.   Spoke with patient this morning and reports very sad, depressed, sorrow, very tearful. He's been out of Lunesta for awhile but hasn't needed. Asking for a refill. He does fall asleep but can't stay asleep. He reports he does not want his thoughts to go back to how he was feeling in 2016, when he had to be hospitalized. Asking if Dr. Jennelle Human can adjust medications to help him get through his emotional depression. His next apt is 01/24/2021. He was very appreciative of follow up call.   [facility]

## 2020-12-25 ENCOUNTER — Other Ambulatory Visit: Payer: Self-pay | Admitting: Psychiatry

## 2020-12-25 NOTE — Telephone Encounter (Signed)
Schedule him 930 Friday AM

## 2020-12-25 NOTE — Telephone Encounter (Signed)
Lunesta sent

## 2020-12-26 ENCOUNTER — Telehealth: Payer: Self-pay | Admitting: Psychiatry

## 2020-12-26 NOTE — Telephone Encounter (Signed)
LM for pt to call back re appt on Friday

## 2020-12-26 NOTE — Telephone Encounter (Signed)
Pt's wife called back to let you know that they are moving Godric to [facility] in [place], [place] tonight.

## 2020-12-26 NOTE — Telephone Encounter (Signed)
Michael Mclean, Real's wife called to inform you that he is in the hospital at [facility] in [place], [place]. He tried to commit suicide. They plan to keep him overnight and then will let her know the discharge plans tomorrow morning. Her phone number is [phone].

## 2020-12-27 ENCOUNTER — Telehealth: Payer: Self-pay | Admitting: Psychiatry

## 2020-12-27 NOTE — Telephone Encounter (Signed)
Received fax from [employer] , Koskela's employer for completion of [employer]. Form needs to be faxed to [employer] employer at [phone]

## 2020-12-27 NOTE — Telephone Encounter (Signed)
Rec

## 2020-12-27 NOTE — Telephone Encounter (Signed)
I received an FMLA for him. I know he was admitted to hospital so not sure we can fill it out since we have not seen him in office.

## 2020-12-31 NOTE — Telephone Encounter (Signed)
Received FMLA paperwork from [employer], patient is due for his yearly intermittent FMLA but he's currently admitted at [facility] for suicidal ideations.  Forms completed to the best of our ability until patient is discharged.   Will have Dr. Jennelle Human sign

## 2021-01-01 DIAGNOSIS — Z0289 Encounter for other administrative examinations: Secondary | ICD-10-CM

## 2021-01-01 NOTE — Telephone Encounter (Signed)
Put him at the top of the cancellation list.

## 2021-01-02 ENCOUNTER — Ambulatory Visit: Payer: 59 | Admitting: Psychiatry

## 2021-01-07 ENCOUNTER — Telehealth (INDEPENDENT_AMBULATORY_CARE_PROVIDER_SITE_OTHER): Payer: 59 | Admitting: Psychiatry

## 2021-01-07 ENCOUNTER — Encounter: Payer: Self-pay | Admitting: Psychiatry

## 2021-01-07 DIAGNOSIS — G251 Drug-induced tremor: Secondary | ICD-10-CM | POA: Diagnosis not present

## 2021-01-07 DIAGNOSIS — F319 Bipolar disorder, unspecified: Secondary | ICD-10-CM | POA: Diagnosis not present

## 2021-01-07 DIAGNOSIS — F411 Generalized anxiety disorder: Secondary | ICD-10-CM

## 2021-01-07 DIAGNOSIS — G4733 Obstructive sleep apnea (adult) (pediatric): Secondary | ICD-10-CM

## 2021-01-07 DIAGNOSIS — Z79899 Other long term (current) drug therapy: Secondary | ICD-10-CM

## 2021-01-07 DIAGNOSIS — F5105 Insomnia due to other mental disorder: Secondary | ICD-10-CM
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
## 2021-01-08 ENCOUNTER — Other Ambulatory Visit: Payer: Self-pay

## 2021-01-08 ENCOUNTER — Telehealth: Payer: Self-pay | Admitting: Psychiatry

## 2021-01-08 DIAGNOSIS — F5105 Insomnia due to other mental disorder: Secondary | ICD-10-CM

## 2021-01-08 NOTE — Telephone Encounter (Signed)
I have a copy of the previous one but it was completed when he was in the hospital so unable to update until office visit. Patient did have a phone visit with Dr. Jennelle Human yesterday. Can update form and have Dr. Jennelle Human review and sign.

## 2021-01-08 NOTE — Telephone Encounter (Signed)
Pt called again asking if we have paper he needs completed today and returned to employer? Please contact @ [phone]. Pt stated there is a portion for provider to complete and portion for manager to complete.

## 2021-01-08 NOTE — Telephone Encounter (Signed)
Pt called and left a message stating that he looked at his FMLA from 2020 and the one from this year . He wants this year to be exactly like 2020 and he needs it to be retroactive.. Too many differences. Please give him a call at [phone]

## 2021-01-11 ENCOUNTER — Encounter: Payer: Self-pay | Admitting: Psychiatry

## 2021-01-18 ENCOUNTER — Encounter: Payer: Self-pay | Admitting: Psychiatry

## 2021-01-18 ENCOUNTER — Telehealth (INDEPENDENT_AMBULATORY_CARE_PROVIDER_SITE_OTHER): Payer: 59 | Admitting: Psychiatry

## 2021-01-18 DIAGNOSIS — F411 Generalized anxiety disorder: Secondary | ICD-10-CM | POA: Diagnosis not present

## 2021-01-18 DIAGNOSIS — F5105 Insomnia due to other mental disorder: Secondary | ICD-10-CM | POA: Diagnosis not present

## 2021-01-18 DIAGNOSIS — G251 Drug-induced tremor: Secondary | ICD-10-CM

## 2021-01-18 DIAGNOSIS — Z79899 Other long term (current) drug therapy: Secondary | ICD-10-CM

## 2021-01-18 DIAGNOSIS — F319 Bipolar disorder, unspecified: Secondary | ICD-10-CM

## 2021-01-18 DIAGNOSIS — G4733 Obstructive sleep apnea (adult) (pediatric): Secondary | ICD-10-CM

## 2021-01-18 MED ORDER — ESZOPICLONE 3 MG PO TABS: ORAL_TABLET | ORAL | 0 refills | Status: DC

## 2021-01-18 MED ORDER — ALPRAZOLAM 0.5 MG PO TABS: ORAL_TABLET | ORAL | 0 refills | Status: DC
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
## 2021-01-23 ENCOUNTER — Telehealth: Payer: Self-pay | Admitting: Psychiatry

## 2021-01-23 NOTE — Telephone Encounter (Signed)
Pt called and left a message that said he doesn't understand why his FMLA from last year is so different from this year and he needs clarification. Also he wanted to check on his wife FMLA. Please call him at [phone]

## 2021-01-23 NOTE — Telephone Encounter (Signed)
Rtc to patient and he is asking for a clarification letter sent concerning his FMLA, per last office visit with Dr. Jennelle Human on 01/18/2021. Just needs to have the days and frequency of episodic intermittent time. Informed him that wouldn't be a problem and we could get something typed up tomorrow and faxed over to same HR #.   He also reports that his wife's FMLA paperwork was never received. Informed him I would also refax that from 01/08/2021

## 2021-01-24 ENCOUNTER — Telehealth: Payer: 59 | Admitting: Psychiatry

## 2021-01-24 NOTE — Telephone Encounter (Signed)
Both FMLA's updated and faxed to their appropriate places.

## 2021-01-24 NOTE — Telephone Encounter (Signed)
This was from his last progress note to which he agreed.   Wanted wifes to say thei same.  Continue FMLA to have leave for episodic flares of condition intermittent estimated 1-5 days, frequency approximately every 2 mos.  This satisfied his work.

## 2021-02-13 ENCOUNTER — Encounter: Payer: Self-pay | Admitting: Psychiatry

## 2021-02-13 ENCOUNTER — Ambulatory Visit (INDEPENDENT_AMBULATORY_CARE_PROVIDER_SITE_OTHER): Payer: 59 | Admitting: Psychiatry

## 2021-02-13 DIAGNOSIS — F5105 Insomnia due to other mental disorder: Secondary | ICD-10-CM

## 2021-02-13 DIAGNOSIS — F319 Bipolar disorder, unspecified: Secondary | ICD-10-CM | POA: Diagnosis not present

## 2021-02-13 DIAGNOSIS — G251 Drug-induced tremor: Secondary | ICD-10-CM | POA: Diagnosis not present

## 2021-02-13 DIAGNOSIS — Z79899 Other long term (current) drug therapy: Secondary | ICD-10-CM

## 2021-02-13 DIAGNOSIS — F411 Generalized anxiety disorder: Secondary | ICD-10-CM | POA: Diagnosis not present

## 2021-02-13 MED ORDER — LAMOTRIGINE 200 MG PO TABS: 200.0000 mg | ORAL_TABLET | Freq: Two times a day (BID) | ORAL | 0 refills | Status: DC

## 2021-02-13 NOTE — Progress Notes (Signed)
Michael Mclean [id number] 05-04-1971 50 y.o.  Video Visit via My Chart  I connected with pt by My Chart and verified that I am speaking with the correct person using two identifiers.   I discussed the limitations, risks, security and privacy concerns of performing an evaluation and management service by My Chart  and the availability of in person appointments. I also discussed with the patient that there may be a patient responsible charge related to this service. The patient expressed understanding and agreed to proceed.  I discussed the assessment and treatment plan with the patient. The patient was provided an opportunity to ask questions and all were answered. The patient agreed with the plan and demonstrated an understanding of the instructions.   The patient was advised to call back or seek an in-person evaluation if the symptoms worsen or if the condition fails to improve as anticipated.  I provided 30 minutes of video time during this encounter.  The patient was located at home and the provider was located office.  Session started at 1000 and ended 1030.   Subjective:   Patient ID:  Michael Mclean is a 50 y.o. (DOB [birth year]) male.  Chief Complaint:  Chief Complaint  Patient presents with  . Follow-up  . Bipolar I disorder (HCC)  . Medication Problem    Medication Refill Pertinent negatives include no chest pain, fatigue or weakness.  Anxiety Patient reports no chest pain, confusion, decreased concentration, dizziness, nervous/anxious behavior, palpitations, shortness of breath or suicidal ideas.    Depression        Associated symptoms include no decreased concentration, no fatigue and no suicidal ideas.  Past medical history includes anxiety.    Michael Mclean is due for follow-up of bipolar disorder.  seen August 11, 2019.  Due to erectile dysfunction Seroquel XR was reduced to 600 mg daily from 800 mg daily.  Also lithium level and labs were ordered.  seen  October 06, 2019.  Serum lithium level, TSH, BMP.  Check it.  He wants to go now this afternoon.  It will not be a trough but it is hard for him to get off of work.  We will expect a lower than usual blood level. Start Vraylar 1.5 mg 1 capsule daily Reduce Seroquel XR to 1 1/2 nightly for 1 week then 1 nightly  seen November 17, 2019.  The following changes were noted: Reduce Vraylar 1.5 mg to every other daily DT restlessness change Seroquel 300mg   1 nightly to improve his sleep which should also help with mood stability.  He called January 15 complaining of insomnia.  Had taken up to 6 Lunesta and 1 night!  He was informed this was dangerous and not to do so again.  He was instructed to increase the quetiapine by an extra half tablet and to increase the Vraylar to 3 mg daily.  It was felt he was having some manic symptoms.  01/26/2020 appointment with the following noted: Admits he had manic sx in January.  Resolved with med changes.  Had to increase the Seroquel to control mania.   Mania had affected work and insomnia.  Tried Vraylar in hopes of being able to reduce eliminate Seroquel.  Was missing work, but not now.  But having problem with FMLA with new supervisor.  Says the episodes have been up to 5 days but the FMLA says leaves would be anticipated for 7-14 days.    Usual Xanax is 2 tablets every 3 days for anxiety. Pretty  good.  Job is still really stressful and only real concern. .  Lasts 3 mos.  Job is very stressful. And volume demands.  [place]. Married June 2020 and right after fell apart for 3-5 days.  Lost control for a little but normal again now.   Seroquel and lithium work well for sleep and mood.  No problems with reduction in Seroquel in mood and the sexual SE are better.  Used Viagra twice. He reduced the lithium back to 900 mg bc felt like it was all that was necessary.  A little scared to increase the lithium bc hand jerks at times.  Some tremor.  Scares him.   Little anger an impulsivity at this time but is a little.  Notices he can't drink much with the lithiium.  When drinks he does too much and may not sleep and then has a really negative effect on him. Lithium has helped manage with less anger and impulsivity.  Pleased with the response.  Did not have this level of control before.  Wants to try to get on less meds and his fiancee' is suggesting it.  Feels guilty about being dependent on the meds.  Plan: It was felt Michael KindleVraylar was probably not having significant clinical effect and in order to reduce polypharmacy it was discontinued.  05/11/2020 appointment with the following noted: Taking Xanax prn.  Disc FMLA amendment helpful.  Tolerating meds.   Overall doing relatively well.  One period when couldn't work for 2-3 days DT anxiety and irritability.  Otherwise better with change lifestyle with marriage and better routine. No problems off the Vraylar.  The increase Seroquel in the past helped sleep and mood and only needed Lunesta 2-3 times since here.  Work function is typical.  Remains stressful job.  Expects that and handling it a little better.  Better self confidence.   Wife also started working at [employer] too.   Plan: no med changes  08/27/20 appt with following noted: Episode early July after out of lamotrigine for several weeks.  Got in huge fight with wife and missed a week of work.  Otherwise is OK since then.   Out of Armodafinil now for 4-6 weeks.  Managing with mild sleepiness.  Sleep is OK usually with prn. Minimal tremor. Patient reports stable mood and denies depressed or irritable moods usually.  Patient has a little more recent difficulty with anxiety.   Denies appetite disturbance.  Patient reports that energy and motivation have been good.  Patient denies any difficulty with concentration.  Patient denies any suicidal ideation. Plan: no med changes.  Check lithium level.  01/07/21 appt noted: [name] on  Friday 01/05/20.  During week drinks only a couple and more on weekend.  Plans to drink only weeknds now. Admitted 12/26/20 with emotions of anger and sorry and regret over prior decisions. What put him over was Ex and he has no say over his daughter's care.  Cut his wrists in [place].  Wife caught him in the process.   Gabapentin 300 added for anxiety.  Not sure if med changes helped.  No fatigue. SE more tremor.  Anxiety still present. Don't have sorrow and anger he had before.  I feel better without SI. Slept OK weekend overall except anxiety about the situation.  Taking Lunesta nightly.  ? Re FMLA. Plan: Increase gabapentin 600 mg TID for anxiety and tremor.  01/18/21 appt: Better off gabapentin DT sexual SE about  3 days ago.  Didn't help anxiety or tremor. Still some anxiety and job stress but handled it OK.   Missed no sig days since here.  Work function not good the first week back. Right now not depressed and not suicidal.  No weapons in the house.  Last SI in hospital. Stress from child's mother and bills can cause him to spiral somewhat.   Taking Lunesta consistently.  Taking Xanax TID and no SE. Plan no med changes  02/13/2021 appt noted: Even keel .  No SI.  Better than last time.  Learned things to help him not get back in the way he was in [place].  Problems with lamotrigine refill. Missed 5-7 days of lamotrigine.  Learned from classes at the hospital.   RTW 2.22.22.  They made a lot of changes there so it's a challenge with fast pace.  Doing well so far.  Louann Sjogren says he's doing OK. Sleep good with Lunesta Seroquel.    No longer drinks too much.  He sees benefit.  [name] says he's well.    Past Psychiatric Medication Trials:  Depakote paranoia, topiramate, Seroquel 800 sedating, lamotrigine, Abilify 20 , Vraylar 3 mg restless and couldn't sleep, lithium 1200 Gabapentin NR and SE Nuvigil,  Wellbutrin, Viibryd,  trazodone 400,  Sonata, amitriptyline, Ambien,  Xanax,     Review of  Systems:  Review of Systems  Constitutional: Negative for fatigue.  Respiratory: Negative for chest tightness and shortness of breath.   Cardiovascular: Negative for chest pain and palpitations.       Hypertension  Neurological: Positive for tremors. Negative for dizziness and weakness.  Psychiatric/Behavioral: Positive for depression. Negative for agitation, behavioral problems, confusion, decreased concentration, dysphoric mood, hallucinations, self-injury, sleep disturbance and suicidal ideas. The patient is not nervous/anxious and is not hyperactive.     Medications: I have reviewed the patient's current medications.  Current Outpatient Medications  Medication Sig Dispense Refill  . ALPRAZolam (XANAX) 0.5 MG tablet Take 1 tablet (0.5 mg) three times daily, as needed. 270 tablet 0  . amLODipine (NORVASC) 10 MG tablet Take 10 mg by mouth daily.    Marland Kitchen atorvastatin (LIPITOR) 40 MG tablet Take 40 mg by mouth daily.    . Eszopiclone 3 MG TABS Take 1 tablet by mouth immediately before bedtime 90 tablet 0  . lithium carbonate 300 MG capsule Take 3 capsules (900 mg total) by mouth daily. (Patient taking differently: Take 900 mg by mouth daily. 3 tabs at bedtime.) 270 capsule 0  . QUEtiapine (SEROQUEL) 300 MG tablet TAKE ONE AND ONE-HALF TABLETS AT BEDTIME 135 tablet 1  . sildenafil (VIAGRA) 100 MG tablet Take by mouth.    . valACYclovir (VALTREX) 500 MG tablet Take 500 mg by mouth. Takes for 5 days    . ibuprofen (ADVIL,MOTRIN) 400 MG tablet Take 1 tablet (400 mg total) by mouth every 6 (six) hours as needed for moderate pain. (Patient not taking: No sig reported) 12 tablet 0  . lamoTRIgine (LAMICTAL) 200 MG tablet Take 1 tablet (200 mg total) by mouth 2 (two) times daily. 180 tablet 0   No current facility-administered medications for this visit.    Medication Side Effects: None sexual SE are better  Allergies: No Known Allergies  Past Medical History:  Diagnosis Date  . Anxiety disorder    . Bipolar 1 disorder (HCC)   . Hyperlipidemia   . Hypertension     History reviewed. No pertinent family history.  Social History   Socioeconomic  History  . Marital status: Single    Spouse name: Not on file  . Number of children: Not on file  . Years of education: Not on file  . Highest education level: Not on file  Occupational History  . Occupation: works for [employer] for [employer]  Tobacco Use  . Smoking status: Never Smoker  . Smokeless tobacco: Never Used  Substance and Sexual Activity  . Alcohol use: Yes  . Drug use: No  . Sexual activity: Not on file  Other Topics Concern  . Not on file  Social History Narrative  . Not on file   Social Determinants of Health   Financial Resource Strain: Not on file  Food Insecurity: Not on file  Transportation Needs: Not on file  Physical Activity: Not on file  Stress: Not on file  Social Connections: Not on file  Intimate Partner Violence: Not on file    Past Medical History, Surgical history, Social history, and Family history were reviewed and updated as appropriate.   Please see review of systems for further details on the patient's review from today.   Objective:   Physical Exam:  There were no vitals taken for this visit.  Physical Exam Neurological:     Mental Status: He is alert and oriented to person, place, and time.     Cranial Nerves: No dysarthria.  Psychiatric:        Attention and Perception: Attention and perception normal.        Mood and Affect: Mood is anxious. Mood is not depressed. Affect is not labile or tearful.        Speech: Speech normal. Speech is not rapid and pressured.        Behavior: Behavior is not slowed. Behavior is cooperative.        Thought Content: Thought content normal. Thought content is not paranoid or delusional. Thought content does not include homicidal or suicidal ideation. Thought content does not include homicidal or suicidal plan.         Cognition and Memory: Cognition and memory normal.        Judgment: Judgment normal.     Comments: Insight fair to good Still feels fragile.     Lab Review:     Component Value Date/Time   NA 140 10/26/2020 0833   K 4.6 10/26/2020 0833   CL 104 10/26/2020 0833   CO2 23 10/26/2020 0833   GLUCOSE 104 (H) 10/26/2020 0833   GLUCOSE 107 (H) 10/06/2019 1534   BUN 13 10/26/2020 0833   CREATININE 1.39 (H) 10/26/2020 0833   CALCIUM 9.8 10/26/2020 0833   PROT 8.7 (H) 07/20/2015 1129   ALBUMIN 4.9 07/20/2015 1129   AST 37 07/20/2015 1129   ALT 51 07/20/2015 1129   ALKPHOS 79 07/20/2015 1129   BILITOT 0.6 07/20/2015 1129   GFRNONAA 59 (L) 10/26/2020 0833   GFRAA 68 10/26/2020 0833       Component Value Date/Time   WBC 3.4 (L) 09/15/2016 0500   RBC 4.37 (L) 09/15/2016 0500   HGB 13.3 09/15/2016 0500   HCT 38.7 (L) 09/15/2016 0500   PLT 242 09/15/2016 0500   MCV 88.6 09/15/2016 0500   MCH 30.4 09/15/2016 0500   MCHC 34.3 09/15/2016 0500   RDW 13.1 09/15/2016 0500   LYMPHSABS 2.3 07/20/2015 1129   MONOABS 0.2 07/20/2015 1129   EOSABS 0.1 07/20/2015 1129   BASOSABS 0.0 07/20/2015 1129    Lithium Lvl  Date Value Ref Range  Status  10/26/2020 0.7 0.5 - 1.2 mmol/L Final    Comment:    Plasma concentration of 0.5 - 0.8 mmol/L are advised for long-term use; concentrations of up to 1.2 mmol/L may be necessary during acute treatment.                                  Detection Limit = 0.1                           <0.1 indicates None Detected     Normal TSH 0.851   No results found for: PHENYTOIN, PHENOBARB, VALPROATE, CBMZ   .res Assessment: Plan:    Bipolar I disorder (HCC) - Plan: Lithium level, lamoTRIgine (LAMICTAL) 200 MG tablet  Generalized anxiety disorder  Insomnia due to mental condition  Lithium-induced tremor  Lithium use   Greater than 50% of 30 min face to face time with patient was spent on counseling and coordination of care. We discussed his  diagnoses and answered questions re: bipolar dx, meds, SE. he has received additional parent benefit and mood consistency with the addition of the lithium.  He is tolerating in the dosage at 900 mg a day but did not tolerate 1200 mg a day very well.  The lithium tremor has been reduced over time.  If he has further problems with depression and irritability we could increase the dose to 1050 instead of 1200.  Discussed lithium jerks and tremors and reassured him that they are not permanent and are not associated with tardive dyskinesia.  Emphasized importance of consistency. Option Vraylar 3 though 1.5 mg caused SE. Consider retry it later at 3 mg as he might tolerate it better with 2 nd trial if don't reduce Seroquel immediately.  Hold Vraylar.  Discussed potential metabolic side effects associated with atypical antipsychotics, as well as potential risk for movement side effects. Advised pt to contact office if movement side effects occur.   We discussed the short-term risks associated with benzodiazepines including sedation and increased fall risk among others.  Discussed long-term side effect risk including dependence, potential withdrawal symptoms, and the potential eventual dose-related risk of dementia.  But recent studies from 2020 dispute this association between benzodiazepines and dementia risk. Newer studies in 2020 do not support an association with dementia.  But did discuss risk of affecting memory in daytime.  Counseled patient regarding potential benefits, risks, and side effects of Lamictal to include potential risk of Stevens-Johnson syndrome. Advised patient to stop taking Lamictal and contact office immediately if rash develops and to seek urgent medical attention if rash is severe and/or spreading quickly.  Also emphasized the need for conisistency with lamotrigine dosing otherwise there is an increased risk of rash.  Disc potential effect on B vitamin levels.   Disc increased rash risk  with inconsistency.  Watch alcohol.  Disc with him.  Disc alcohol and dehydration and risk lithium toxicity .  Counseled patient regarding potential benefits, risks, and side effects of lithium to include potential risk of lithium affecting thyroid and renal function.  Discussed need for periodic lab monitoring to determine drug level and to assess for potential adverse effects.  Counseled patient regarding signs and symptoms of lithium toxicity and advised that they notify office immediately or seek urgent medical attention if experiencing these signs and symptoms.  Patient advised to contact office with any questions or concerns.  Get lithium level today.  Continue Seroquel  nightly to improve his sleep which should also help with mood stability.  Had mania at lower dose and sedation at higher dose.  Polypharmacy in hopes lithium alone will be sufficient at preventing mania.  Continue FMLA to have leave for episodic flares of condition intermittent estimated 1-5 days, frequency approximately every 2 mos.  This satisfied his work.  Restart lamotrigine 1/2 of 200 mg tablet for 5 days, then 1 tablet daily for 5 days, then 1 and 1/2 tablets daily for 5 days then 1 twice daily.  Disc rash risk. Not restarting at the very lowest dose because patient is at risk of withdrawal from the lamotrigine if that is done and he has experienced withdrawal in this situation before.  He was recently in the hospital and starting low and going slow with the lamotrigine in this situation could put him at increased risk of relapse.  We specifically discussed the rash risk and if he saw any rash to let us know.  He is aware of the risk and agrees with the plan.  Continue alprazolam 0.5 mg TID in place DT benefit  Discussed the risks and in his case necessity of polypharmacy.  He is agreeable to continue the current meds.  FU 6 weeks  Meredith Staggers, MD, DFAPA   Please see After Visit Summary for patient  specific instructions.  No future appointments.  Orders Placed This Encounter  Procedures  . Lithium level      -------------------------------

## 2021-03-07 ENCOUNTER — Other Ambulatory Visit: Payer: Self-pay | Admitting: Psychiatry

## 2021-03-07 DIAGNOSIS — F319 Bipolar disorder, unspecified: Secondary | ICD-10-CM

## 2021-03-13 NOTE — Telephone Encounter (Signed)
Call to [facility]

## 2021-03-15 NOTE — Telephone Encounter (Signed)
LM TCB for an appt 

## 2021-03-28 ENCOUNTER — Telehealth: Payer: Self-pay | Admitting: Psychiatry

## 2021-03-28 NOTE — Telephone Encounter (Signed)
He needs to be in counseling for this problem.  It is not something that is easily addressed by medicine.  He lives in [place].  He needs to find a therapist in his area and start as soon as possible.

## 2021-03-28 NOTE — Telephone Encounter (Signed)
Please review

## 2021-03-28 NOTE — Telephone Encounter (Signed)
Pt 's wife called and said that over the weekend he talked about cutting himself. He has not done it but wife wants to know what to do. He keeps talking about the pleasure of doing it. Please call his wife Denny Peon at [phone]

## 2021-04-01 ENCOUNTER — Telehealth: Payer: Self-pay | Admitting: Psychiatry

## 2021-04-01 ENCOUNTER — Other Ambulatory Visit: Payer: Self-pay

## 2021-04-01 DIAGNOSIS — F319 Bipolar disorder, unspecified: Secondary | ICD-10-CM

## 2021-04-01 MED ORDER — QUETIAPINE FUMARATE 300 MG PO TABS: ORAL_TABLET | ORAL | 1 refills | Status: DC

## 2021-04-01 NOTE — Telephone Encounter (Signed)
Contacted [facility] ER to get medical records faxed over to our office. They requested we fax over pt's name, dob, along with our information to [phone]. Pt was seen in the ER yesterday 5/15. Given information to [place] to fax over request.

## 2021-04-01 NOTE — Telephone Encounter (Signed)
Please review.I have also been trying to get in touch with his wife about last weeks message fyi.

## 2021-04-01 NOTE — Telephone Encounter (Signed)
Rx sent 

## 2021-04-01 NOTE — Telephone Encounter (Signed)
Rtc to pt and he is home right now, he was unable to work today due to vertigo. He went to ER in [facility] [phone] yesterday his wife called 911 and EMS took him to [facility] ER. He reports about 1-1 1/2 weeks ago he started having memory issues and experiencing feeling off balance when he was walking, he fell this past Thursday night and hit his head. He has a bump on his head. Then on Sunday (yesterday) he was trying to walk from living room to kitchen and was falling everywhere. He reports his tremors are more pronounced too. His wife decided to call 911 because she couldn't help him get around. The ER did check a lithium level and pt reports they told him it was low but stable, or he wouldn't have been discharged. He was reading from ER discharge sheet that he was treated for Vertigo, he did get a Rx for that as well and it mentions his Bipolar. Other then that he said nothing else was done. He feels like he needs something to help his memory, he said he doesn't know how he's even been working, also requesting something for tremors. Informed him I would update Dr. Jennelle Human and he would probably need to see him in the office to assess his condition, he did agree. His next apt isn't until 6/30 so we may need to work him in.  He did not bring up the cutting situation, this seems to be the focus right now. His wife was with him when I called as well.

## 2021-04-01 NOTE — Telephone Encounter (Signed)
This was faxed today. 4:15pm.

## 2021-04-01 NOTE — Telephone Encounter (Signed)
Next visit is 05/16/21. Michael Mclean's wife called on 5/15 in the afternoon to say she had called EMS on him because he was having vertigo, memory loss and tremors were worse. He was being sent to the ER at [facility]. The hospital will be doing a lithium check. His phone number is [phone].

## 2021-04-01 NOTE — Telephone Encounter (Signed)
Pt called and would like his seroquel to be sent to [facility]. This will be his primary pharmacy.

## 2021-04-01 NOTE — Telephone Encounter (Signed)
Call the pt or his wife.  I do not see lab results in Epic including nothing about ED visit or labs in "Care Everywhere" tab of Epic

## 2021-04-03 NOTE — Telephone Encounter (Signed)
Front staff reports you received his records. Does he need apt ?

## 2021-04-03 NOTE — Telephone Encounter (Signed)
Patient evaluated in [facility] ER for dizziness after a fall.  Work-up including urine drug screen CBC, comprehensive metabolic panel urinalysis, and head CT were all normal.  Lithium level was 0.4.  He was diagnosed with vertigo and given meclizine.  The patient did not express any distressing psychiatric symptoms in the ER.  No current indication to change psychiatric treatment.

## 2021-04-16 ENCOUNTER — Other Ambulatory Visit: Payer: Self-pay | Admitting: Psychiatry

## 2021-04-16 DIAGNOSIS — F411 Generalized anxiety disorder: Secondary | ICD-10-CM

## 2021-04-16 DIAGNOSIS — F5105 Insomnia due to other mental disorder: Secondary | ICD-10-CM

## 2021-04-16 DIAGNOSIS — F319 Bipolar disorder, unspecified: Secondary | ICD-10-CM

## 2021-05-06 ENCOUNTER — Other Ambulatory Visit: Payer: Self-pay

## 2021-05-06 DIAGNOSIS — F319 Bipolar disorder, unspecified: Secondary | ICD-10-CM

## 2021-05-06 MED ORDER — LAMOTRIGINE 200 MG PO TABS: 200.0000 mg | ORAL_TABLET | Freq: Two times a day (BID) | ORAL | 0 refills | Status: DC

## 2021-05-16 ENCOUNTER — Ambulatory Visit: Payer: 59 | Admitting: Psychiatry

## 2021-05-17 ENCOUNTER — Encounter: Payer: Self-pay | Admitting: Psychiatry

## 2021-05-17 ENCOUNTER — Telehealth (INDEPENDENT_AMBULATORY_CARE_PROVIDER_SITE_OTHER): Payer: 59 | Admitting: Psychiatry

## 2021-05-17 DIAGNOSIS — F411 Generalized anxiety disorder: Secondary | ICD-10-CM

## 2021-05-17 DIAGNOSIS — F5105 Insomnia due to other mental disorder: Secondary | ICD-10-CM | POA: Diagnosis not present

## 2021-05-17 DIAGNOSIS — G4733 Obstructive sleep apnea (adult) (pediatric): Secondary | ICD-10-CM

## 2021-05-17 DIAGNOSIS — Z79899 Other long term (current) drug therapy: Secondary | ICD-10-CM

## 2021-05-17 DIAGNOSIS — G251 Drug-induced tremor: Secondary | ICD-10-CM | POA: Diagnosis not present

## 2021-05-17 DIAGNOSIS — F319 Bipolar disorder, unspecified: Secondary | ICD-10-CM | POA: Diagnosis not present

## 2021-05-17 MED ORDER — CLONAZEPAM 0.5 MG PO TABS: 0.5000 mg | ORAL_TABLET | Freq: Three times a day (TID) | ORAL | 0 refills | Status: DC | PRN

## 2021-05-17 NOTE — Progress Notes (Signed)
Michael Mclean [id number] 01-Dec-1970 50 y.o.  Video Visit via My Chart  I connected with pt by My Chart and verified that I am speaking with the correct person using two identifiers.   I discussed the limitations, risks, security and privacy concerns of performing an evaluation and management service by My Chart  and the availability of in person appointments. I also discussed with the patient that there may be a patient responsible charge related to this service. The patient expressed understanding and agreed to proceed.  I discussed the assessment and treatment plan with the patient. The patient was provided an opportunity to ask questions and all were answered. The patient agreed with the plan and demonstrated an understanding of the instructions.   The patient was advised to call back or seek an in-person evaluation if the symptoms worsen or if the condition fails to improve as anticipated.  I provided 30 minutes of video time during this encounter.  The patient was located at home and the provider was located office.  Session started at 1000 and ended 1030.   Subjective:   Patient ID:  Michael Mclean is a 50 y.o. (DOB 14-Sep-1971) male.  Chief Complaint:  Chief Complaint  Patient presents with   Follow-up   Depression   Anxiety    Medication Refill Pertinent negatives include no chest pain, fatigue or weakness.  Anxiety Patient reports no chest pain, confusion, decreased concentration, dizziness, nervous/anxious behavior, palpitations, shortness of breath or suicidal ideas.    Depression        Associated symptoms include no decreased concentration, no fatigue and no suicidal ideas.  Past medical history includes anxiety.   Michael Mclean is due for follow-up of bipolar disorder.  seen August 11, 2019.  Due to erectile dysfunction Seroquel XR was reduced to 600 mg daily from 800 mg daily.  Also lithium level and labs were ordered.  seen October 06, 2019.  Serum lithium  level, TSH, BMP.  Check it.  He wants to go now this afternoon.  It will not be a trough but it is hard for him to get off of work.  We will expect a lower than usual blood level. Start Vraylar 1.5 mg 1 capsule daily Reduce Seroquel XR to 1 1/2 nightly for 1 week then 1 nightly  seen November 17, 2019.  The following changes were noted: Reduce Vraylar 1.5 mg to every other daily DT restlessness change Seroquel 300mg   1 nightly to improve his sleep which should also help with mood stability.  He called January 15 complaining of insomnia.  Had taken up to 6 Lunesta and 1 night!  He was informed this was dangerous and not to do so again.  He was instructed to increase the quetiapine by an extra half tablet and to increase the Vraylar to 3 mg daily.  It was felt he was having some manic symptoms.  01/26/2020 appointment with the following noted: Admits he had manic sx in January.  Resolved with med changes.  Had to increase the Seroquel to control mania.   Mania had affected work and insomnia.  Tried Vraylar in hopes of being able to reduce eliminate Seroquel.  Was missing work, but not now.  But having problem with FMLA with new supervisor.  Says the episodes have been up to 5 days but the FMLA says leaves would be anticipated for 7-14 days.    Usual Xanax is 2 tablets every 3 days for anxiety. Pretty good.  Job is still  really stressful and only real concern. .  Lasts 3 mos.  Job is very stressful. And volume demands.  Lives in [place]. Married June 2020 and right after fell apart for 3-5 days.  Lost control for a little but normal again now.   Seroquel and lithium work well for sleep and mood.  No problems with reduction in Seroquel in mood and the sexual SE are better.  Used Viagra twice. He reduced the lithium back to 900 mg bc felt like it was all that was necessary.  A little scared to increase the lithium bc hand jerks at times.  Some tremor.  Scares him.  Little anger an impulsivity at  this time but is a little.  Notices he can't drink much with the lithiium.  When drinks he does too much and may not sleep and then has a really negative effect on him. Lithium has helped manage with less anger and impulsivity.  Pleased with the response.  Did not have this level of control before.  Wants to try to get on less meds and his fiancee' is suggesting it.  Feels guilty about being dependent on the meds.  Plan: It was felt Michael Mclean was probably not having significant clinical effect and in order to reduce polypharmacy it was discontinued.  05/11/2020 appointment with the following noted: Taking Xanax prn.  Disc FMLA amendment helpful.  Tolerating meds.   Overall doing relatively well.  One period when couldn't work for 2-3 days DT anxiety and irritability.  Otherwise better with change lifestyle with marriage and better routine. No problems off the Vraylar.  The increase Seroquel in the past helped sleep and mood and only needed Lunesta 2-3 times since here.  Work function is typical.  Remains stressful job.  Expects that and handling it a little better.  Better self confidence.   Wife also started working at [employer] too.   Plan: no med changes  08/27/20 appt with following noted: Episode early July after out of lamotrigine for several weeks.  Got in huge fight with wife and missed a week of work.  Otherwise is OK since then.   Out of Armodafinil now for 4-6 weeks.  Managing with mild sleepiness.  Sleep is OK usually with prn. Minimal tremor. Patient reports stable mood and denies depressed or irritable moods usually.  Patient has a little more recent difficulty with anxiety.   Denies appetite disturbance.  Patient reports that energy and motivation have been good.  Patient denies any difficulty with concentration.  Patient denies any suicidal ideation. Plan: no med changes.  Check lithium level.  01/07/21 appt noted: DC from hospital [facility] on Friday 01/05/20.  During week  drinks only a couple and more on weekend.  Plans to drink only weeknds now. Admitted 12/26/20 with emotions of anger and sorry and regret over prior decisions. What put him over was Ex and he has no say over his daughter's care.  Cut his wrists in SA.  Wife caught him in the process.   Gabapentin 300 added for anxiety.  Not sure if med changes helped.  No fatigue. SE more tremor.  Anxiety still present. Don't have sorrow and anger he had before.  I feel better without SI. Slept OK weekend overall except anxiety about the situation.  Taking Lunesta nightly.  ? Re FMLA. Plan: Increase gabapentin 600 mg TID for anxiety and tremor.  01/18/21 appt: Better off gabapentin DT sexual SE about 3 days ago.  Didn't  help anxiety or tremor. Still some anxiety and job stress but handled it OK.   Missed no sig days since here.  Work function not good the first week back. Right now not depressed and not suicidal.  No weapons in the house.  Last SI in hospital. Stress from child's mother and bills can cause him to spiral somewhat.   Taking Lunesta consistently.  Taking Xanax TID and no SE. Plan no med changes  02/13/2021 appt noted: Even keel .  No SI.  Better than last time.  Learned things to help him not get back in the way he was in [place].  Problems with lamotrigine refill. Missed 5-7 days of lamotrigine.  Learned from classes at the hospital.   RTW 2.22.22.  They made a lot of changes there so it's a challenge with fast pace.  Doing well so far.  Louann SjogrenBoss says he's doing OK. Sleep good with Lunesta Seroquel.   Plan: Restart lamotrigine 1/2 of 200 mg tablet for 5 days, then 1 tablet daily for 5 days, then 1 and 1/2 tablets daily for 5 days then 1 twice daily.  10/2021 patient's wife called concerned because he was cutting himself.  Was not a suicide attempt. 04/01/2021 patient evaluated in the ER.  And diagnosed with vertigo  05/17/2021 appointment with the following noted: Vertigo lasted about 2 mos off and on.    Says wife got info about cutting were gone.  He had scratched himself.  He was not suicidal and did not cut himself.  He is not cutting. Lately overall more stable but is under a bit of stress and is anxious.  Seems like the Xanax is not working as well.  Wonders about switching BZ. Increased work demands.   Sometimes some manic sx with increased talking and hyperactivity including last week.  Depression managed.   Sleeep is good.  Impulsivity managed bc debt.     No longer drinks too much.  He sees benefit.  Bethann PunchesFianacee says he's well.    Past Psychiatric Medication Trials:  Depakote paranoia, topiramate, Seroquel 800 sedating, lamotrigine, Abilify 20 , Vraylar 3 mg restless and couldn't sleep, lithium 1200 Gabapentin NR and SE Nuvigil,  Wellbutrin, Viibryd,  trazodone 400,  Sonata, amitriptyline, Ambien,  Xanax,     Review of Systems:  Review of Systems  Constitutional:  Negative for fatigue.  Respiratory:  Negative for shortness of breath.   Cardiovascular:  Negative for chest pain and palpitations.       Hypertension  Neurological:  Positive for tremors. Negative for dizziness and weakness.  Psychiatric/Behavioral:  Negative for agitation, behavioral problems, confusion, decreased concentration, dysphoric mood, hallucinations, self-injury, sleep disturbance and suicidal ideas. The patient is not nervous/anxious and is not hyperactive.    Medications: I have reviewed the patient's current medications.  Current Outpatient Medications  Medication Sig Dispense Refill   amLODipine (NORVASC) 10 MG tablet Take 10 mg by mouth daily.     atorvastatin (LIPITOR) 40 MG tablet Take 40 mg by mouth daily.     clonazePAM (KLONOPIN) 0.5 MG tablet Take 1 tablet (0.5 mg total) by mouth 3 (three) times daily as needed for anxiety. 90 tablet 0   Eszopiclone 3 MG TABS TAKE 1 TABLET IMMEDIATELY BEFORE BEDTIME 90 tablet 0   ibuprofen (ADVIL,MOTRIN) 400 MG tablet Take 1 tablet (400 mg total) by mouth  every 6 (six) hours as needed for moderate pain. 12 tablet 0   lamoTRIgine (LAMICTAL) 200 MG tablet Take 1 tablet (200  mg total) by mouth 2 (two) times daily. 60 tablet 0   lamoTRIgine (LAMICTAL) 200 MG tablet Take 1 tablet (200 mg total) by mouth 2 (two) times daily. 180 tablet 0   lithium carbonate 300 MG capsule TAKE 3 CAPSULES DAILY 270 capsule 0   QUEtiapine (SEROQUEL) 300 MG tablet TAKE ONE AND ONE-HALF TABLETS AT BEDTIME 135 tablet 1   sildenafil (VIAGRA) 100 MG tablet Take by mouth.     valACYclovir (VALTREX) 500 MG tablet Take 500 mg by mouth. Takes for 5 days     No current facility-administered medications for this visit.    Medication Side Effects: None sexual SE are better  Allergies: No Known Allergies  Past Medical History:  Diagnosis Date   Anxiety disorder    Bipolar 1 disorder (HCC)    Hyperlipidemia    Hypertension     History reviewed. No pertinent family history.  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  Tobacco Use   Smoking status: Never   Smokeless tobacco: Never  Substance and Sexual Activity   Alcohol use: Yes   Drug use: No   Sexual activity: Not on file  Other Topics Concern   Not on file  Social History Narrative   Not on file   Social Determinants of Health   Financial Resource Strain: Not on file  Food Insecurity: Not on file  Transportation Needs: Not on file  Physical Activity: Not on file  Stress: Not on file  Social Connections: Not on file  Intimate Partner Violence: Not on file    Past Medical History, Surgical history, Social history, and Family history were reviewed and updated as appropriate.   Please see review of systems for further details on the patient's review from  today.   Objective:   Physical Exam:  There were no vitals taken for this visit.  Physical Exam Neurological:     Mental Status: He is alert and oriented to person, place, and time.     Cranial Nerves: No dysarthria.  Psychiatric:        Attention and Perception: Attention and perception normal.        Mood and Affect: Mood is anxious. Mood is not depressed. Affect is not labile or tearful.        Speech: Speech normal. Speech is not rapid and pressured or slurred.        Behavior: Behavior is not slowed. Behavior is cooperative.        Thought Content: Thought content normal. Thought content is not paranoid or delusional. Thought content does not include homicidal or suicidal ideation. Thought content does not include homicidal or suicidal plan.        Cognition and Memory: Cognition and memory normal.        Judgment: Judgment normal.     Comments: Insight fair to good Still feels fragile a little but not depressed.    Lab Review:     Component Value Date/Time   NA 140 10/26/2020 0833   K 4.6 10/26/2020 0833   CL 104 10/26/2020 0833   CO2 23 10/26/2020 0833   GLUCOSE 104 (H) 10/26/2020 0833   GLUCOSE 107 (H) 10/06/2019 1534   BUN 13  10/26/2020 0833   CREATININE 1.39 (H) 10/26/2020 0833   CALCIUM 9.8 10/26/2020 0833   PROT 8.7 (H) 07/20/2015 1129   ALBUMIN 4.9 07/20/2015 1129   AST 37 07/20/2015 1129   ALT 51 07/20/2015 1129   ALKPHOS 79 07/20/2015 1129   BILITOT 0.6 07/20/2015 1129   GFRNONAA 59 (L) 10/26/2020 0833   GFRAA 68 10/26/2020 0833       Component Value Date/Time   WBC 3.4 (L) 09/15/2016 0500   RBC 4.37 (L) 09/15/2016 0500   HGB 13.3 09/15/2016 0500   HCT 38.7 (L) 09/15/2016 0500   PLT 242 09/15/2016 0500   MCV 88.6 09/15/2016 0500   MCH 30.4 09/15/2016 0500   MCHC 34.3 09/15/2016 0500   RDW 13.1 09/15/2016 0500   LYMPHSABS 2.3 07/20/2015 1129   MONOABS 0.2 07/20/2015 1129   EOSABS 0.1 07/20/2015 1129   BASOSABS 0.0 07/20/2015 1129    Lithium  Lvl  Date Value Ref Range Status  10/26/2020 0.7 0.5 - 1.2 mmol/L Final    Comment:    Plasma concentration of 0.5 - 0.8 mmol/L are advised for long-term use; concentrations of up to 1.2 mmol/L may be necessary during acute treatment.                                  Detection Limit = 0.1                           <0.1 indicates None Detected     Normal TSH 0.851   No results found for: PHENYTOIN, PHENOBARB, VALPROATE, CBMZ   .res Assessment: Plan:    Bipolar I disorder (HCC) - Plan: Lithium level  Generalized anxiety disorder - Plan: clonazePAM (KLONOPIN) 0.5 MG tablet  Insomnia due to mental condition  Lithium-induced tremor  Obstructive sleep apnea  Lithium use   Greater than 50% of 30 min face to face time with patient was spent on counseling and coordination of care. We discussed his diagnoses and answered questions re: bipolar dx, meds, SE. he has received additional parent benefit and mood consistency with the addition of the lithium.  He is tolerating in the dosage at 900 mg a day but did not tolerate 1200 mg a day very well.  The lithium tremor has been reduced over time.  If he has further problems with depression and irritability we could increase the dose to 1050 instead of 1200.  Discussed lithium jerks and tremors and reassured him that they are not permanent and are not associated with tardive dyskinesia.  Had some mild manic symptoms recently.  We will change Xanax to clonazepam which may have a little better mood stabilizing effect.  He typically does not tolerate higher doses of Seroquel and were probably at the high as dose of lithium that he can tolerate.  However we will check a lithium level.  Emphasized importance of consistency. Option Vraylar 3 though 1.5 mg caused SE. Consider retry it later at 3 mg as he might tolerate it better with 2 nd trial if don't reduce Seroquel immediately.  Hold Vraylar.  Discussed potential metabolic side effects associated  with atypical antipsychotics, as well as potential risk for movement side effects. Advised pt to contact office if movement side effects occur.   We discussed the short-term risks associated with benzodiazepines including sedation and increased fall risk among others.  Discussed long-term side effect  risk including dependence, potential withdrawal symptoms, and the potential eventual dose-related risk of dementia.  But recent studies from 2020 dispute this association between benzodiazepines and dementia risk. Newer studies in 2020 do not support an association with dementia.  But did discuss risk of affecting memory in daytime.  Counseled patient regarding potential benefits, risks, and side effects of Lamictal to include potential risk of Stevens-Johnson syndrome. Advised patient to stop taking Lamictal and contact office immediately if rash develops and to seek urgent medical attention if rash is severe and/or spreading quickly.  Also emphasized the need for conisistency with lamotrigine dosing otherwise there is an increased risk of rash.  Disc potential effect on B vitamin levels.   Disc increased rash risk with inconsistency.  Watch alcohol.  Disc with him.  Disc alcohol and dehydration and risk lithium toxicity .  Counseled patient regarding potential benefits, risks, and side effects of lithium to include potential risk of lithium affecting thyroid and renal function.  Discussed need for periodic lab monitoring to determine drug level and to assess for potential adverse effects.  Counseled patient regarding signs and symptoms of lithium toxicity and advised that they notify office immediately or seek urgent medical attention if experiencing these signs and symptoms.  Patient advised to contact office with any questions or concerns.  Get lithium level today.  Continue Seroquel 450mg  nightly to improve his sleep which should also help with mood stability.  Had mania at lower dose and sedation at  higher dose.  Polypharmacy in hopes lithium alone will be sufficient at preventing mania.  Continue FMLA to have leave for episodic flares of condition intermittent estimated 1-5 days, frequency approximately every 2 mos.  This satisfied his work.  Continue lamotrigine 200 mg BID  OK trial clonazepam 0.5 mg TID  Discussed the risks and in his case necessity of polypharmacy.  He is agreeable to continue the current meds.  FU 6 weeks  , MD, DFAPA   Please see After Visit Summary for patient specific instructions.  No future appointments.  Orders Placed This Encounter  Procedures   Lithium level       -------------------------------

## 2021-05-29 ENCOUNTER — Other Ambulatory Visit: Payer: Self-pay

## 2021-06-07 LAB — LITHIUM LEVEL: Lithium Lvl: 0.5 mmol/L (ref 0.5–1.2)

## 2021-06-19 ENCOUNTER — Telehealth: Payer: Self-pay | Admitting: Psychiatry

## 2021-06-19 NOTE — Telephone Encounter (Signed)
Received fax from [employer] for completion of an FMLA form for [employer]. Placed on Traci's desk.

## 2021-06-24 DIAGNOSIS — Z0289 Encounter for other administrative examinations: Secondary | ICD-10-CM

## 2021-06-24 NOTE — Telephone Encounter (Signed)
FMLA completed and signed by Dr. Jennelle Human. Left pt message to confirm where to fax to. Last time it went to fax# [phone].

## 2021-06-25 NOTE — Telephone Encounter (Signed)
Pt left message that the FMLA paperwork was faxed to the wrong number. Please fax to [phone]. Att: Elease Hashimoto

## 2021-06-25 NOTE — Telephone Encounter (Signed)
Please review

## 2021-06-25 NOTE — Telephone Encounter (Signed)
No the paper work was NOT faxed yet. The previous time it was faxed to the # listed. That's why we left pt message telling us where to send it.

## 2021-06-25 NOTE — Telephone Encounter (Signed)
Paper work has been faxed

## 2021-07-10 ENCOUNTER — Telehealth: Payer: Self-pay | Admitting: Psychiatry

## 2021-07-10 NOTE — Telephone Encounter (Signed)
These are major and important mood stabilizing medications for him.  We cannot change these medications between his appointments.  He can go on the cancellation list and we can discuss it if we can get him in to an earlier appointment. I cannot do anything about the weight gain right now.

## 2021-07-10 NOTE — Telephone Encounter (Signed)
Next visit is 09/16/21. Michael Mclean called and said that the Seroquel and Lithium he has been taking is causing weight gain and is making his hands swollen. He would like to discuss  the side effects and what he can do to avoid these from happening. His phone number is [phone].

## 2021-07-10 NOTE — Telephone Encounter (Signed)
Please review

## 2021-07-11 NOTE — Telephone Encounter (Signed)
Yes we have spoke with her before. Surprised she is not on there. We need to get him to sign that next time he's in

## 2021-07-11 NOTE — Telephone Encounter (Signed)
I called the number listed and they told me I have the wrong number his wife is not on DPR.Can I still call her to see if I can speak to him?

## 2021-07-12 NOTE — Telephone Encounter (Signed)
Please add to cancellation list and if you are able to reach him please relay Dr cottle's message

## 2021-09-16 ENCOUNTER — Encounter: Payer: Self-pay | Admitting: Psychiatry

## 2021-09-16 ENCOUNTER — Telehealth (INDEPENDENT_AMBULATORY_CARE_PROVIDER_SITE_OTHER): Payer: 59 | Admitting: Psychiatry

## 2021-09-16 DIAGNOSIS — F411 Generalized anxiety disorder: Secondary | ICD-10-CM | POA: Diagnosis not present

## 2021-09-16 DIAGNOSIS — G251 Drug-induced tremor: Secondary | ICD-10-CM | POA: Diagnosis not present

## 2021-09-16 DIAGNOSIS — F5105 Insomnia due to other mental disorder: Secondary | ICD-10-CM

## 2021-09-16 DIAGNOSIS — F319 Bipolar disorder, unspecified: Secondary | ICD-10-CM

## 2021-09-16 DIAGNOSIS — Z79899 Other long term (current) drug therapy: Secondary | ICD-10-CM

## 2021-09-16 DIAGNOSIS — G4733 Obstructive sleep apnea (adult) (pediatric): Secondary | ICD-10-CM

## 2021-09-16 MED ORDER — LATUDA 60 MG PO TABS: ORAL_TABLET | ORAL | 1 refills | Status: DC

## 2021-09-16 MED ORDER — QUETIAPINE FUMARATE 300 MG PO TABS: ORAL_TABLET | ORAL | 1 refills | Status: DC

## 2021-09-16 MED ORDER — PROPRANOLOL HCL 40 MG PO TABS: ORAL_TABLET | ORAL | 0 refills | Status: DC

## 2021-09-16 MED ORDER — LAMOTRIGINE 200 MG PO TABS: 200.0000 mg | ORAL_TABLET | Freq: Two times a day (BID) | ORAL | 0 refills | Status: DC

## 2021-09-16 MED ORDER — LAMOTRIGINE 200 MG PO TABS
200.0000 mg | ORAL_TABLET | Freq: Two times a day (BID) | ORAL | 0 refills | Status: DC
Start: 2021-09-16 — End: 2021-09-16

## 2021-09-16 NOTE — Progress Notes (Signed)
Michael Mclean [id number] 10/03/1971 50 y.o.  Video Visit via My Chart  I connected with pt by My Chart and verified that I am speaking with the correct person using two identifiers.   I discussed the limitations, risks, security and privacy concerns of performing an evaluation and management service by My Chart  and the availability of in person appointments. I also discussed with the patient that there may be a patient responsible charge related to this service. The patient expressed understanding and agreed to proceed.  I discussed the assessment and treatment plan with the patient. The patient was provided an opportunity to ask questions and all were answered. The patient agreed with the plan and demonstrated an understanding of the instructions.   The patient was advised to call back or seek an in-person evaluation if the symptoms worsen or if the condition fails to improve as anticipated.  I provided 30 minutes of video time during this encounter.  The patient was located at home and the provider was located office.  Session started at 230 until 3 PM   Subjective:   Patient ID:  Michael Mclean is a 50 y.o. (DOB 05/29/1971) male.  Chief Complaint:  Chief Complaint  Patient presents with   Follow-up   Medication Problem   Sleeping Problem   Stress   Anxiety    Medication Refill Pertinent negatives include no fatigue or weakness.  Anxiety Patient reports no confusion, decreased concentration, dizziness, nervous/anxious behavior, palpitations, shortness of breath or suicidal ideas.    Depression        Associated symptoms include no decreased concentration, no fatigue and no suicidal ideas.  Past medical history includes anxiety.   Michael Mclean is due for follow-up of bipolar disorder.  seen August 11, 2019.  Due to erectile dysfunction Seroquel XR was reduced to 600 mg daily from 800 mg daily.  Also lithium level and labs were ordered.  seen October 06, 2019.  Serum  lithium level, TSH, BMP.  Check it.  He wants to go now this afternoon.  It will not be a trough but it is hard for him to get off of work.  We will expect a lower than usual blood level. Start Vraylar 1.5 mg 1 capsule daily Reduce Seroquel XR to 1 1/2 nightly for 1 week then 1 nightly  seen November 17, 2019.  The following changes were noted: Reduce Vraylar 1.5 mg to every other daily DT restlessness change Seroquel   1 nightly to improve his sleep which should also help with mood stability.  He called January 15 complaining of insomnia.  Had taken up to 6 Lunesta and 1 night!  He was informed this was dangerous and not to do so again.  He was instructed to increase the quetiapine by an extra half tablet and to increase the Vraylar to 3 mg daily.  It was felt he was having some manic symptoms.  01/26/2020 appointment with the following noted: Admits he had manic sx in January.  Resolved with med changes.  Had to increase the Seroquel to control mania.   Mania had affected work and insomnia.  Tried Vraylar in hopes of being able to reduce eliminate Seroquel.  Was missing work, but not now.  But having problem with FMLA with new supervisor.  Says the episodes have been up to 5 days but the FMLA says leaves would be anticipated for 7-14 days.    Usual Xanax is 2 tablets every 3 days for anxiety. Pretty Michael.  Job is still really stressful and only real concern. .  Lasts 3 mos.  Job is very stressful. And volume demands.  Lives in [place]. Married June 2020 and right after fell apart for 3-5 days.  Lost control for a little but normal again now.   Seroquel and lithium work well for sleep and mood.  No problems with reduction in Seroquel in mood and the sexual SE are better.  Used Viagra twice. He reduced the lithium back to 900 mg bc felt like it was all that was necessary.  A little scared to increase the lithium bc hand jerks at times.  Some tremor.  Scares him.  Little anger an  impulsivity at this time but is a little.  Notices he can't drink much with the lithiium.  When drinks he does too much and may not sleep and then has a really negative effect on him. Lithium has helped manage with less anger and impulsivity.  Pleased with the response.  Did not have this level of control before.  Wants to try to get on less meds and his fiancee' is suggesting it.  Feels guilty about being dependent on the meds.  Plan: It was felt Michael [place] was probably not having significant clinical effect and in order to reduce polypharmacy it was discontinued.  05/11/2020 appointment with the following noted: Taking Xanax prn.  Disc FMLA amendment helpful.  Tolerating meds.   Overall doing relatively well.  One period when couldn't work for 2-3 days DT anxiety and irritability.  Otherwise better with change lifestyle with marriage and better routine. No problems off the Vraylar.  The increase Seroquel in the past helped sleep and mood and only needed Lunesta 2-3 times since here.  Work function is typical.  Remains stressful job.  Expects that and handling it a little better.  Better self confidence.   Wife also started working at [employer] too.   Plan: no med changes  08/27/20 appt with following noted: Episode early July after out of lamotrigine for several weeks.  Got in huge fight with wife and missed a week of work.  Otherwise is OK since then.   Out of Armodafinil now for 4-6 weeks.  Managing with mild sleepiness.  Sleep is OK usually with prn. Minimal tremor. Patient reports stable mood and denies depressed or irritable moods usually.  Patient has a little more recent difficulty with anxiety.   Denies appetite disturbance.  Patient reports that energy and motivation have been Michael.  Patient denies any difficulty with concentration.  Patient denies any suicidal ideation. Plan: no med changes.  Check lithium level.  01/07/21 appt noted: DC from hospital [place] on Friday 01/05/20.   During week drinks only a couple and more on weekend.  Plans to drink only weeknds now. Admitted 12/26/20 with emotions of anger and sorry and regret over prior decisions. What put him over was Ex and he has no say over his daughter's care.  Cut his wrists in [place].  Wife caught him in the process.   Gabapentin 300 added for anxiety.  Not sure if med changes helped.  No fatigue. SE more tremor.  Anxiety still present. Don't have sorrow and anger he had before.  I feel better without SI. Slept OK weekend overall except anxiety about the situation.  Taking Lunesta nightly.  ? Re FMLA. Plan: Increase gabapentin 600 mg TID for anxiety and tremor.  01/18/21 appt: Better off gabapentin DT sexual SE about 3 days  ago.  Didn't help anxiety or tremor. Still some anxiety and job stress but handled it OK.   Missed no sig days since here.  Work function not Michael the first week back. Right now not depressed and not suicidal.  No weapons in the house.  Last SI in hospital. Stress from child's mother and bills can cause him to spiral somewhat.   Taking Lunesta consistently.  Taking Xanax TID and no SE. Plan no med changes  02/13/2021 appt noted: Even keel .  No SI.  Better than last time.  Learned things to help him not get back in the way he was in [place].  Problems with lamotrigine refill. Missed 5-7 days of lamotrigine.  Learned from classes at the hospital.   RTW 2.22.22.  They made a lot of changes there so it's a challenge with fast pace.  Doing well so far.  Louann Sjogren says he's doing OK. Sleep Michael with Lunesta Seroquel.   Plan: Restart lamotrigine 1/2 of 200 mg tablet for 5 days, then 1 tablet daily for 5 days, then 1 and 1/2 tablets daily for 5 days then 1 twice daily.  10/2021 patient's wife called concerned because he was cutting himself.  Was not a suicide attempt. 04/01/2021 patient evaluated in the ER.  And diagnosed with vertigo  05/17/2021 appointment with the following noted: Vertigo lasted about 2 mos  off and on.   Says wife got info about cutting were gone.  He had scratched himself.  He was not suicidal and did not cut himself.  He is not cutting. Lately overall more stable but is under a bit of stress and is anxious.  Seems like the Xanax is not working as well.  Wonders about switching BZ. Increased work demands.   Sometimes some manic sx with increased talking and hyperactivity including last week.  Depression managed.   Sleeep is Michael.  Impulsivity managed bc debt.   Plan: Get lithium level today. Continue Seroquel 450mg  nightly to improve his sleep which should also help with mood stability.  Had mania at lower dose and sedation at higher dose.  Polypharmacy in hopes lithium alone will be sufficient at preventing mania. Continue FMLA to have leave for episodic flares of condition intermittent estimated 1-5 days, frequency approximately every 2 mos.  This satisfied his work. Continue lamotrigine 200 mg BID OK trial clonazepam 0.5 mg TID  07/10/2021 phone call: Next visit is 09/16/21. Josten called and said that the Seroquel and Lithium he has been taking is causing weight gain and is making his hands swollen. He would like to discuss  the side effects and what he can do to avoid these from happening.  He was told that these are major and important mood stabilizing medications for him and we cannot make these changes without an appointment.  09/16/2021 appointment with the following noted: Takes clonazepam 0.5 and 1 mg rarely and not a lot of effectiveness so still using Xanax 0.5 mg but takes 3-4 tablets twice daily if having bad day of anxiety.  Denies SE.  Takes Xanax about once daily routinely. Average 2-4 daily. Taking lamotrigine 200 mg BID, Seroquel 450.  Ran out past few days. Doesn't sleep well without meds. Problems with child's mother and huge disagreements over caretaking and child support. Really stressful job, over scrutinized so lives anxiety 7-8/10 and better after  work. Concerned about meds and weight gain. Hungry and thinks it's Seroquel.  No longer drinks too much.  He sees benefit.  [name] says he's well.    Past Psychiatric Medication Trials:  Depakote paranoia, topiramate, Seroquel 800 sedating, lamotrigine, Abilify 20 , Vraylar 3 mg restless and couldn't sleep, lithium 1200 Gabapentin NR and SE Nuvigil,  Wellbutrin, Viibryd,  trazodone 400,  Sonata, amitriptyline, Ambien,  Xanax, Klonopin 1 mg    Review of Systems:  Review of Systems  Constitutional:  Negative for fatigue.  Respiratory:  Negative for shortness of breath.   Cardiovascular:  Negative for palpitations.       Hypertension  Neurological:  Positive for tremors. Negative for dizziness and weakness.  Psychiatric/Behavioral:  Negative for agitation, behavioral problems, confusion, decreased concentration, dysphoric mood, hallucinations, self-injury, sleep disturbance and suicidal ideas. The patient is not nervous/anxious and is not hyperactive.    Medications: I have reviewed the patient's current medications.  Current Outpatient Medications  Medication Sig Dispense Refill   amLODipine (NORVASC) 10 MG tablet Take 10 mg by mouth daily.     atorvastatin (LIPITOR) 40 MG tablet Take 40 mg by mouth daily.     clonazePAM (KLONOPIN) 0.5 MG tablet Take 1 tablet (0.5 mg total) by mouth 3 (three) times daily as needed for anxiety. 90 tablet 0   Eszopiclone 3 MG TABS TAKE 1 TABLET IMMEDIATELY BEFORE BEDTIME 90 tablet 0   ibuprofen (ADVIL,MOTRIN) 400 MG tablet Take 1 tablet (400 mg total) by mouth every 6 (six) hours as needed for moderate pain. 12 tablet 0   lithium carbonate 300 MG capsule TAKE 3 CAPSULES DAILY 270 capsule 0   Lurasidone HCl (LATUDA) 60 MG TABS 1/2 tablet in the evening with a meal for 1 week then 1 tablet each evening 30 tablet 1   propranolol (INDERAL) 40 MG tablet Take 1-2 tabs po BID prn anxiety 100 tablet 0   sildenafil (VIAGRA) 100 MG tablet Take by mouth.      valACYclovir (VALTREX) 500 MG tablet Take 500 mg by mouth. Takes for 5 days     lamoTRIgine (LAMICTAL) 200 MG tablet Take 1 tablet (200 mg total) by mouth 2 (two) times daily. 180 tablet 0   QUEtiapine (SEROQUEL) 300 MG tablet TAKE ONE AND ONE-HALF TABLETS AT BEDTIME 45 tablet 1   No current facility-administered medications for this visit.    Medication Side Effects: None sexual SE are better  Allergies: No Known Allergies  Past Medical History:  Diagnosis Date   Anxiety disorder    Bipolar 1 disorder (HCC)    Hyperlipidemia    Hypertension     History reviewed. No pertinent family history.  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  Tobacco Use   Smoking status: Never   Smokeless tobacco: Never  Substance and Sexual Activity   Alcohol use: Yes   Drug use: No   Sexual activity: Not on file  Other Topics Concern   Not on file  Social History Narrative   Not on file   Social Determinants of Health   Financial Resource Strain: Not on file  Food Insecurity: Not on file  Transportation Needs: Not on file  Physical Activity: Not on file  Stress: Not on file  Social Connections: Not on file  Intimate Partner Violence: Not on file  Past Medical History, Surgical history, Social history, and Family history were reviewed and updated as appropriate.   Please see review of systems for further details on the patient's review from today.   Objective:   Physical Exam:  There were no vitals taken for this visit.  Physical Exam Neurological:     Mental Status: He is alert and oriented to person, place, and time.     Cranial Nerves: No dysarthria.  Psychiatric:        Attention and Perception: Attention and perception  normal.        Mood and Affect: Mood is anxious. Mood is not depressed. Affect is not labile or tearful.        Speech: Speech normal. Speech is not rapid and pressured, slurred or tangential.        Behavior: Behavior is not slowed. Behavior is cooperative.        Thought Content: Thought content normal. Thought content is not paranoid or delusional. Thought content does not include homicidal or suicidal ideation. Thought content does not include homicidal or suicidal plan.        Cognition and Memory: Cognition and memory normal.        Judgment: Judgment normal.     Comments: Insight fair to [place] feels fragile a little but not depressed.    Lab Review:     Component Value Date/Time   NA 140 10/26/2020 0833   K 4.6 10/26/2020 0833   CL 104 10/26/2020 0833   CO2 23 10/26/2020 0833   GLUCOSE 104 (H) 10/26/2020 0833   GLUCOSE 107 (H) 10/06/2019 1534   BUN 13 10/26/2020 0833   CREATININE 1.39 (H) 10/26/2020 0833   CALCIUM 9.8 10/26/2020 0833   PROT 8.7 (H) 07/20/2015 1129   ALBUMIN 4.9 07/20/2015 1129   AST 37 07/20/2015 1129   ALT 51 07/20/2015 1129   ALKPHOS 79 07/20/2015 1129   BILITOT 0.6 07/20/2015 1129   GFRNONAA 59 (L) 10/26/2020 0833   GFRAA 68 10/26/2020 0833       Component Value Date/Time   WBC 3.4 (L) 09/15/2016 0500   RBC 4.37 (L) 09/15/2016 0500   HGB 13.3 09/15/2016 0500   HCT 38.7 (L) 09/15/2016 0500   PLT 242 09/15/2016 0500   MCV 88.6 09/15/2016 0500   MCH 30.4 09/15/2016 0500   MCHC 34.3 09/15/2016 0500   RDW 13.1 09/15/2016 0500   LYMPHSABS 2.3 07/20/2015 1129   MONOABS 0.2 07/20/2015 1129   EOSABS 0.1 07/20/2015 1129   BASOSABS 0.0 07/20/2015 1129    Lithium Lvl  Date Value Ref Range Status  06/06/2021 0.5 0.5 - 1.2 mmol/L Final    Comment:    Plasma concentration of 0.5 - 0.8 mmol/L are advised for long-term use; concentrations of up to 1.2 mmol/L may be necessary during acute treatment.                                  Detection  Limit = 0.1                           <0.1 indicates None Detected     Normal TSH 0.851   No results found for: PHENYTOIN, PHENOBARB, VALPROATE, CBMZ   .res Assessment: Plan:    Bipolar I disorder (HCC) - Plan: Lurasidone HCl (LATUDA) 60 MG TABS, QUEtiapine (SEROQUEL) 300 MG tablet, lamoTRIgine (LAMICTAL) 200  MG tablet, DISCONTINUED: lamoTRIgine (LAMICTAL) 200 MG tablet  Generalized anxiety disorder - Plan: propranolol (INDERAL) 40 MG tablet  Insomnia due to mental condition  Lithium-induced tremor  Obstructive sleep apnea  Lithium use   Greater than 50% of 30 min face to face time with patient was spent on counseling and coordination of care. We discussed his diagnoses and answered questions re: bipolar dx, meds, SE. he has received additional parent benefit and mood consistency with the addition of the lithium.  He is tolerating in the dosage at 900 mg a day but did not tolerate 1200 mg a day very well.  The lithium tremor has been reduced over time.  If he has further problems with depression and irritability we could increase the dose to 1050 instead of 1200.  Discussed lithium jerks and tremors and reassured him that they are not permanent and are not associated with tardive dyskinesia.  Had some mild manic symptoms recently.  We will change Xanax to clonazepam which may have a little better mood stabilizing effect.  He typically does not tolerate higher doses of Seroquel and were probably at the high as dose of lithium that he can tolerate.  However we will check a lithium level.  Emphasized importance of consistency.  This is a problem and puts risk mania and cycling emphasized.  Option Vraylar 3 though 1.5 mg caused SE. Consider retry it later at 3 mg as he might tolerate it better with 2 nd trial if don't reduce Seroquel immediately.  Hold Vraylar.  Discussed potential metabolic side effects associated with atypical antipsychotics, as well as potential risk for movement side  effects. Advised pt to contact office if movement side effects occur.   We discussed the short-term risks associated with benzodiazepines including sedation and increased fall risk among others.  Discussed long-term side effect risk including dependence, potential withdrawal symptoms, and the potential eventual dose-related risk of dementia.  But recent studies from 2020 dispute this association between benzodiazepines and dementia risk. Newer studies in 2020 do not support an association with dementia.  But did discuss risk of affecting memory in daytime.  Counseled patient regarding potential benefits, risks, and side effects of Lamictal to include potential risk of Stevens-Johnson syndrome. Advised patient to stop taking Lamictal and contact office immediately if rash develops and to seek urgent medical attention if rash is severe and/or spreading quickly.  Also emphasized the need for conisistency with lamotrigine dosing otherwise there is an increased risk of rash.  Disc potential effect on B vitamin levels.   Disc increased rash risk with inconsistency.  Watch alcohol.  Disc with him.  Disc alcohol and dehydration and risk lithium toxicity .  Counseled patient regarding potential benefits, risks, and side effects of lithium to include potential risk of lithium affecting thyroid and renal function.  Discussed need for periodic lab monitoring to determine drug level and to assess for potential adverse effects.  Counseled patient regarding signs and symptoms of lithium toxicity and advised that they notify office immediately or seek urgent medical attention if experiencing these signs and symptoms.  Patient advised to contact office with any questions or concerns.  06/06/21 lithium level 0.5. Continue Lithium 900 mg daily.  RE:  Seroquel 450mg  nightly Had mania at lower dose and sedation at higher dose. Start Latuda 30 mg for 1 week then 60 mg daily. Disc risk relapse with change.  Continue FMLA  to have leave for episodic flares of condition intermittent estimated 1-5 days, frequency approximately  every 2 mos.  This satisfied his work.  Continue lamotrigine 200 mg BID  trial propranolol 40-60 mg BID prn anxiety.  OK use with Xanax.  Need try to reduce Xanax dose.  Discussed the risks and in his case necessity of polypharmacy.  He is agreeable to continue the current meds.  FU 6 weeks  Meredith Staggers, MD, DFAPA   Please see After Visit Summary for patient specific instructions.  No future appointments.   No orders of the defined types were placed in this encounter.      -------------------------------

## 2021-10-13 ENCOUNTER — Other Ambulatory Visit: Payer: Self-pay | Admitting: Psychiatry

## 2021-10-13 DIAGNOSIS — F411 Generalized anxiety disorder: Secondary | ICD-10-CM

## 2021-10-13 DIAGNOSIS — F319 Bipolar disorder, unspecified: Secondary | ICD-10-CM

## 2021-10-16 ENCOUNTER — Telehealth: Payer: Self-pay | Admitting: Psychiatry

## 2021-10-16 NOTE — Telephone Encounter (Signed)
Pt LVM stating he isn't sure if he is taking his meds like he is supposed to.  The phone was breaking up, so I only heard about him taking Latuda and taking Seroquel but he thought he and Dr Jennelle Human discussed changing the Seroquel dosage.  Right now he is taking the same Seroquel dose he has been taking for the last year.  Sounds like he needs to confirm all meds and dosages he should be taking right now.  Next app 1/9

## 2021-10-16 NOTE — Telephone Encounter (Signed)
LM to call back and discuss his medication

## 2021-10-17 ENCOUNTER — Telehealth: Payer: Self-pay

## 2021-10-17 NOTE — Telephone Encounter (Signed)
Pt called to confirm he's taking his medication correctly, he's taking Latuda 60 mg every pm with dinner, Lamotrigine 200 mg bid, Lithium 900 mg hs, and Quetiapine 450 mg hs. His next apt is 01/09. He reports feeling good right now, he feels sometimes his bipolar is hard to recognize if he's manic or not but he feels he is stable right now. He knows the plan is to lose weight and didn't know if he's suppose to stay at 450 mg of quetiapine until he comes back or slowly decrease before then? He reports he is knocked out within 30 minutes of taking hs meds and sleeps great.   Informed him all medication dosing is correct according to last note but I would discuss with Dr. Jennelle Human if he needed to adjust anything prior to apt on 11/25/20

## 2021-10-17 NOTE — Telephone Encounter (Signed)
He can go ahead and try reducing the Seroquel back to 300 mg nightly instead of 450 mg nightly.  The goal is to gradually transition him to [place] to help weight loss.  If he develops any manic symptoms let us know and we will increase the Latuda dosage

## 2021-10-21 NOTE — Telephone Encounter (Signed)
Left pt message to call back and discuss 

## 2021-10-27 ENCOUNTER — Other Ambulatory Visit: Payer: Self-pay | Admitting: Psychiatry

## 2021-10-27 DIAGNOSIS — F319 Bipolar disorder, unspecified: Secondary | ICD-10-CM

## 2021-11-15 ENCOUNTER — Other Ambulatory Visit: Payer: Self-pay

## 2021-11-15 DIAGNOSIS — F319 Bipolar disorder, unspecified: Secondary | ICD-10-CM

## 2021-11-15 DIAGNOSIS — F5105 Insomnia due to other mental disorder: Secondary | ICD-10-CM

## 2021-11-15 MED ORDER — ESZOPICLONE 3 MG PO TABS: ORAL_TABLET | ORAL | 0 refills | Status: DC

## 2021-11-15 MED ORDER — LATUDA 60 MG PO TABS: ORAL_TABLET | ORAL | 1 refills | Status: DC

## 2021-11-15 MED ORDER — QUETIAPINE FUMARATE 300 MG PO TABS: ORAL_TABLET | ORAL | 1 refills | Status: DC

## 2021-11-15 NOTE — Telephone Encounter (Signed)
Spoke with pt this morning and advised him to reduce Seroquel to 300 mg q hs. He agreed and verbalized understanding,   Pt needed refills for Latuda 60 mg, Lunesta 3 mg and updated and resent Seroquel 300 mg, all medications submitted to [facility].

## 2021-11-25 ENCOUNTER — Encounter: Payer: 59 | Admitting: Psychiatry

## 2021-11-25 NOTE — Progress Notes (Signed)
Couldn't reach pt for phone or video session.  NAThis encounter was created in error - please disregard.

## 2021-11-27 ENCOUNTER — Telehealth: Payer: Self-pay | Admitting: Psychiatry

## 2021-11-27 NOTE — Telephone Encounter (Signed)
Next visit is 12/30/20. [facility] called and said that a Seroquel adjustment was made by Dr. Jennelle Human and he has been up for the past several nights without sleep. He does not think this is a good idea. He would like to go back to the Seroquel 450 mg. Can he do this? His phone number is [phone].

## 2021-11-27 NOTE — Telephone Encounter (Signed)
Unable to reach pt.Please review and if you need more info I will keep trying to reach him but it is difficult.

## 2021-11-27 NOTE — Telephone Encounter (Signed)
He can increase the quetiapine back to 450 mg at night.  This does increase the risk of side effects because of the combination with Latuda.  He missed an appointment recently.  He needs to be sure and keep the next appointment.

## 2021-11-28 NOTE — Telephone Encounter (Signed)
Pt informed

## 2021-12-13 ENCOUNTER — Other Ambulatory Visit: Payer: Self-pay

## 2021-12-13 ENCOUNTER — Telehealth: Payer: Self-pay | Admitting: Psychiatry

## 2021-12-13 DIAGNOSIS — F319 Bipolar disorder, unspecified: Secondary | ICD-10-CM

## 2021-12-13 MED ORDER — QUETIAPINE FUMARATE 300 MG PO TABS: ORAL_TABLET | ORAL | 0 refills | Status: DC

## 2021-12-13 NOTE — Telephone Encounter (Signed)
Rtc to pt and he is taking 300 mg Seroquel, he said he wasn't aware he was suppose to increase it 2 weeks ago. He's at Latuda 60 mg but he does want to stop it. I told him that's okay. He reports being manic and making some very bad decisions. He hasn't been sleeping. He said he would rather be fat then be in this state. He was not interested in increasing Latuda, feels it's not the right fit. I advised him to increase Seroquel to 450 mg. I will send in a Rx per his request.

## 2021-12-13 NOTE — Telephone Encounter (Signed)
Changes were made and looks like there was a phone message a couple weeks ago.

## 2021-12-13 NOTE — Telephone Encounter (Signed)
Pt left a message stating that that the latuda is not helping. He is not sleeping and is mor manic. He would like to go back to what he was taking before. Seroquel 450 mg. Please call him and let him know if that is ok. His next appt is in February. Phone number is  [phone]

## 2021-12-13 NOTE — Telephone Encounter (Signed)
I will take care of this message.

## 2021-12-13 NOTE — Telephone Encounter (Signed)
Please verify his current dosages of Seroquel and Latuda.  He called back on January 11 complaining of not sleeping and was told at that time that he could increase the Seroquel to 450 mg at night.  He is obviously not taking enough Latuda to prevent the mania.  There is still a chance we can transition him to Jordan to reduce the risk of weight gain if he is interested.  The maximum dose of Latuda is 120 mg daily and I do not believe he is taking that much.  If he wanted to continue to try the Latuda we can increase the dose of Latuda as always he is not having side effects with it and try to keep the Seroquel dose as low as possible as long as he can still sleep up to a max of 450 mg daily.  He has an appointment in about 2 weeks.  If he wants to give up on this Latuda and go back to the original dose of Seroquel then he can do that.  But again Jordan may work if it is given at a higher dosage.

## 2021-12-16 NOTE — Telephone Encounter (Signed)
Noted! Thank you

## 2021-12-26 ENCOUNTER — Telehealth: Payer: Self-pay | Admitting: Psychiatry

## 2021-12-26 NOTE — Telephone Encounter (Signed)
Received fax from [employer]. Completion needed for FMLA form for his employer [employer]. Placed in Traci's box.

## 2021-12-30 ENCOUNTER — Telehealth (INDEPENDENT_AMBULATORY_CARE_PROVIDER_SITE_OTHER): Payer: 59 | Admitting: Psychiatry

## 2021-12-30 ENCOUNTER — Encounter: Payer: Self-pay | Admitting: Psychiatry

## 2021-12-30 DIAGNOSIS — F411 Generalized anxiety disorder: Secondary | ICD-10-CM | POA: Diagnosis not present

## 2021-12-30 DIAGNOSIS — R635 Abnormal weight gain: Secondary | ICD-10-CM

## 2021-12-30 DIAGNOSIS — T50905A Adverse effect of unspecified drugs, medicaments and biological substances, initial encounter: Secondary | ICD-10-CM

## 2021-12-30 DIAGNOSIS — F319 Bipolar disorder, unspecified: Secondary | ICD-10-CM | POA: Diagnosis not present

## 2021-12-30 DIAGNOSIS — G251 Drug-induced tremor: Secondary | ICD-10-CM

## 2021-12-30 DIAGNOSIS — F5105 Insomnia due to other mental disorder: Secondary | ICD-10-CM

## 2021-12-30 DIAGNOSIS — Z79899 Other long term (current) drug therapy: Secondary | ICD-10-CM

## 2021-12-30 DIAGNOSIS — G4733 Obstructive sleep apnea (adult) (pediatric): Secondary | ICD-10-CM

## 2021-12-30 MED ORDER — LITHIUM CARBONATE 300 MG PO CAPS: 900.0000 mg | ORAL_CAPSULE | Freq: Every day | ORAL | 0 refills | Status: DC

## 2021-12-30 MED ORDER — QUETIAPINE FUMARATE 300 MG PO TABS: ORAL_TABLET | ORAL | 0 refills | Status: DC

## 2021-12-30 MED ORDER — METFORMIN HCL 500 MG PO TABS: ORAL_TABLET | ORAL | 1 refills | Status: DC

## 2021-12-30 MED ORDER — LAMOTRIGINE 200 MG PO TABS: 200.0000 mg | ORAL_TABLET | Freq: Two times a day (BID) | ORAL | 0 refills | Status: DC

## 2021-12-30 NOTE — Progress Notes (Signed)
Michael Mclean [id number] 1971/02/19 51 y.o.  Video Visit via My Chart  I connected with pt by My Chart and verified that I am speaking with the correct person using two identifiers.   I discussed the limitations, risks, security and privacy concerns of performing an evaluation and management service by My Chart  and the availability of in person appointments. I also discussed with the patient that there may be a patient responsible charge related to this service. The patient expressed understanding and agreed to proceed.  I discussed the assessment and treatment plan with the patient. The patient was provided an opportunity to ask questions and all were answered. The patient agreed with the plan and demonstrated an understanding of the instructions.   The patient was advised to call back or seek an in-person evaluation if the symptoms worsen or if the condition fails to improve as anticipated.  I provided 30 minutes of video time during this encounter.  The patient was located at home and the provider was located office.  Session started at 330 until 4 PM  Subjective:   Patient ID:  Michael Mclean is a 51 y.o. (DOB 1971-02-14) male.  Chief Complaint:  Chief Complaint  Patient presents with   Follow-up   Manic Behavior   Medication Problem    Medication Refill Pertinent negatives include no fatigue, nausea or weakness.  Anxiety Patient reports no confusion, decreased concentration, dizziness, nausea, nervous/anxious behavior, palpitations, shortness of breath or suicidal ideas.    Depression        Associated symptoms include no decreased concentration, no fatigue and no suicidal ideas.  Past medical history includes anxiety.   Michael Mclean is due for follow-up of bipolar disorder.  seen August 11, 2019.  Due to erectile dysfunction Seroquel XR was reduced to 600 mg daily from 800 mg daily.  Also lithium level and labs were ordered.  seen October 06, 2019.  Serum lithium  level, TSH, BMP.  Check it.  He wants to go now this afternoon.  It will not be a trough but it is hard for him to get off of work.  We will expect a lower than usual blood level. Start Vraylar 1.5 mg 1 capsule daily Reduce Seroquel XR to 1 1/2 nightly for 1 week then 1 nightly  seen November 17, 2019.  The following changes were noted: Reduce Vraylar 1.5 mg to every other daily DT restlessness change Seroquel 300mg   1 nightly to improve his sleep which should also help with mood stability.  He called January 15 complaining of insomnia.  Had taken up to 6 Lunesta and 1 night!  He was informed this was dangerous and not to do so again.  He was instructed to increase the quetiapine by an extra half tablet and to increase the Vraylar to 3 mg daily.  It was felt he was having some manic symptoms.  01/26/2020 appointment with the following noted: Admits he had manic sx in January.  Resolved with med changes.  Had to increase the Seroquel to control mania.   Mania had affected work and insomnia.  Tried Vraylar in hopes of being able to reduce eliminate Seroquel.  Was missing work, but not now.  But having problem with FMLA with new supervisor.  Says the episodes have been up to 5 days but the FMLA says leaves would be anticipated for 7-14 days.    Usual Xanax is 2 tablets every 3 days for anxiety. Pretty good.  Job is still really  stressful and only real concern. .  Lasts 3 mos.  Job is very stressful. And volume demands.  Lives in [place]. Married June 2020 and right after fell apart for 3-5 days.  Lost control for a little but normal again now.   Seroquel and lithium work well for sleep and mood.  No problems with reduction in Seroquel in mood and the sexual SE are better.  Used Viagra twice. He reduced the lithium back to 900 mg bc felt like it was all that was necessary.  A little scared to increase the lithium bc hand jerks at times.  Some tremor.  Scares him.  Little anger an impulsivity at  this time but is a little.  Notices he can't drink much with the lithiium.  When drinks he does too much and may not sleep and then has a really negative effect on him. Lithium has helped manage with less anger and impulsivity.  Pleased with the response.  Did not have this level of control before.  Wants to try to get on less meds and his fiancee' is suggesting it.  Feels guilty about being dependent on the meds.  Plan: It was felt Arman Filter was probably not having significant clinical effect and in order to reduce polypharmacy it was discontinued.  05/11/2020 appointment with the following noted: Taking Xanax prn.  Disc FMLA amendment helpful.  Tolerating meds.   Overall doing relatively well.  One period when couldn't work for 2-3 days DT anxiety and irritability.  Otherwise better with change lifestyle with marriage and better routine. No problems off the Vraylar.  The increase Seroquel in the past helped sleep and mood and only needed Lunesta 2-3 times since here.  Work function is typical.  Remains stressful job.  Expects that and handling it a little better.  Better self confidence.   Wife also started working at [employer] too.   Plan: no med changes  08/27/20 appt with following noted: Episode early July after out of lamotrigine for several weeks.  Got in huge fight with wife and missed a week of work.  Otherwise is OK since then.   Out of Armodafinil now for 4-6 weeks.  Managing with mild sleepiness.  Sleep is OK usually with prn. Minimal tremor. Patient reports stable mood and denies depressed or irritable moods usually.  Patient has a little more recent difficulty with anxiety.   Denies appetite disturbance.  Patient reports that energy and motivation have been good.  Patient denies any difficulty with concentration.  Patient denies any suicidal ideation. Plan: no med changes.  Check lithium level.  01/07/21 appt noted: DC from hospital [facility] [place] on Friday 01/05/20.  During week  drinks only a couple and more on weekend.  Plans to drink only weeknds now. Admitted 12/26/20 with emotions of anger and sorry and regret over prior decisions. What put him over was Ex and he has no say over his daughter's care.  Cut his wrists in [place].  Wife caught him in the process.   Gabapentin 300 added for anxiety.  Not sure if med changes helped.  No fatigue. SE more tremor.  Anxiety still present. Don't have sorrow and anger he had before.  I feel better without SI. Slept OK weekend overall except anxiety about the situation.  Taking Lunesta nightly.  ? Re FMLA. Plan: Increase gabapentin 600 mg TID for anxiety and tremor.  01/18/21 appt: Better off gabapentin DT sexual SE about 3 days ago.  Didn't help  anxiety or tremor. Still some anxiety and job stress but handled it OK.   Missed no sig days since here.  Work function not good the first week back. Right now not depressed and not suicidal.  No weapons in the house.  Last SI in hospital. Stress from child's mother and bills can cause him to spiral somewhat.   Taking Lunesta consistently.  Taking Xanax TID and no SE. Plan no med changes  02/13/2021 appt noted: Even keel .  No SI.  Better than last time.  Learned things to help him not get back in the way he was in [place].  Problems with lamotrigine refill. Missed 5-7 days of lamotrigine.  Learned from classes at the hospital.   RTW 2.22.22.  They made a lot of changes there so it's a challenge with fast pace.  Doing well so far.  Fredrich Birks says he's doing OK. Sleep good with Lunesta Seroquel.   Plan: Restart lamotrigine 1/2 of 200 mg tablet for 5 days, then 1 tablet daily for 5 days, then 1 and 1/2 tablets daily for 5 days then 1 twice daily.  10/2021 patient's wife called concerned because he was cutting himself.  Was not a suicide attempt. 04/01/2021 patient evaluated in the ER.  And diagnosed with vertigo  05/17/2021 appointment with the following noted: Vertigo lasted about 2 mos off and on.    Says wife got info about cutting were gone.  He had scratched himself.  He was not suicidal and did not cut himself.  He is not cutting. Lately overall more stable but is under a bit of stress and is anxious.  Seems like the Xanax is not working as well.  Wonders about switching BZ. Increased work demands.   Sometimes some manic sx with increased talking and hyperactivity including last week.  Depression managed.   Sleeep is good.  Impulsivity managed bc debt.   Plan: Get lithium level today. Continue Seroquel 450mg  nightly to improve his sleep which should also help with mood stability.  Had mania at lower dose and sedation at higher dose.  Polypharmacy in hopes lithium alone will be sufficient at preventing mania. Continue FMLA to have leave for episodic flares of condition intermittent estimated 1-5 days, frequency approximately every 2 mos.  This satisfied his work. Continue lamotrigine 200 mg BID OK trial clonazepam 0.5 mg TID  07/10/2021 phone call: Next visit is 09/16/21. [place] called and said that the Seroquel and Lithium he has been taking is causing weight gain and is making his hands swollen. He would like to discuss  the side effects and what he can do to avoid these from happening.  He was told that these are major and important mood stabilizing medications for him and we cannot make these changes without an appointment.  09/16/2021 appointment with the following noted: Takes clonazepam 0.5 and 1 mg rarely and not a lot of effectiveness so still using Xanax 0.5 mg but takes 3-4 tablets twice daily if having bad day of anxiety.  Denies SE.  Takes Xanax about once daily routinely. Average 2-4 daily. Taking lamotrigine 200 mg BID, Seroquel 450.  Ran out past few days. Doesn't sleep well without meds. Problems with child's mother and huge disagreements over caretaking and child support. Really stressful job, over scrutinized so lives anxiety 7-8/10 and better after work. Concerned  about meds and weight gain. Hungry and thinks it's Seroquel. Plan:  RE:  Seroquel 450mg  nightly Had mania at lower dose and sedation at  higher dose. Start Latuda 30 mg for 1 week then 60 mg daily. Continue FMLA to have leave for episodic flares of condition intermittent estimated 1-5 days, frequency approximately every 2 mos.  This satisfied his work. Continue lamotrigine 200 mg BID trial propranolol 40-60 mg BID prn anxiety.  OK use with Xanax.  Need try to reduce Xanax dose.  Follow-up 6 weeks  10/16/2021 phone call this patient was content fused about how he was supposed to be taking Latuda and Seroquel.  He was taking it appropriately at quetiapine 450 mg nightly and Latuda 60 mg p.m.  With a goal of weaning the Seroquel he was encouraged to reduce that to 300 mg nightly.  11/27/2021 phone call complaining of difficulty sleeping with the lower dose of Seroquel and wanting to increase it back to 450 mg nightly.  This is an increase was agreed.  12/13/2021 phone call: Complaining of poor sleep and more mania. He had not increased the Seroquel to 450 mg daily and was taking Latuda 60 mg and wanted to stop it.  12/30/2021 appointment the following noted:  Didn't like Latuda bc felt he was more manic on it with more spending and credit card debt.   Off the Latuda about 3 weeks. Rather deal with the weight than have unstable mood. Mood is more stable back on Seroquel 450 mg nightly with the lithium 900 mg nightly.  He is not manic currently.  He is needing the Lunesta to sleep.  Admits to taking 2 at a time on occasion.  No longer drinks too much.  He sees benefit.  Junie Bame says he's well.    Past Psychiatric Medication Trials:  Depakote paranoia, topiramate, Seroquel 800 sedating, lamotrigine, Abilify 20 , Vraylar 3 mg restless and couldn't sleep,  Latuda 60 mg with Seroquel 300 mg mania lithium 1200 side effect  Gabapentin NR and SE Nuvigil,  Wellbutrin, Viibryd,  trazodone 400,  Sonata,  amitriptyline, Ambien,  Xanax, Klonopin 1 mg    Review of Systems:  Review of Systems  Constitutional:  Negative for fatigue.  Respiratory:  Negative for shortness of breath.   Cardiovascular:  Negative for palpitations.       Hypertension  Gastrointestinal:  Negative for nausea.  Neurological:  Positive for tremors. Negative for dizziness and weakness.  Psychiatric/Behavioral:  Positive for depression. Negative for agitation, behavioral problems, confusion, decreased concentration, dysphoric mood, hallucinations, self-injury, sleep disturbance and suicidal ideas. The patient is not nervous/anxious and is not hyperactive.    Medications: I have reviewed the patient's current medications.  Current Outpatient Medications  Medication Sig Dispense Refill   amLODipine (NORVASC) 10 MG tablet Take 10 mg by mouth daily.     atorvastatin (LIPITOR) 40 MG tablet Take 40 mg by mouth daily.     clonazePAM (KLONOPIN) 0.5 MG tablet Take 1 tablet (0.5 mg total) by mouth 3 (three) times daily as needed for anxiety. 90 tablet 0   Eszopiclone 3 MG TABS TAKE 1 TABLET IMMEDIATELY BEFORE BEDTIME 90 tablet 0   ibuprofen (ADVIL,MOTRIN) 400 MG tablet Take 1 tablet (400 mg total) by mouth every 6 (six) hours as needed for moderate pain. 12 tablet 0   metFORMIN (GLUCOPHAGE) 500 MG tablet Take 1 tablet daily with a meal for one week, and then increase to 1 tablet twice daily with meals if tolerated 60 tablet 1   propranolol (INDERAL) 40 MG tablet TAKE 1 TO 2 TABLETS BY MOUTH TWICE A DAY AS NEEDED FOR ANXIETY 100 tablet 0  sildenafil (VIAGRA) 100 MG tablet Take by mouth.     valACYclovir (VALTREX) 500 MG tablet Take 500 mg by mouth. Takes for 5 days     lamoTRIgine (LAMICTAL) 200 MG tablet Take 1 tablet (200 mg total) by mouth 2 (two) times daily. 180 tablet 0   lithium carbonate 300 MG capsule Take 3 capsules (900 mg total) by mouth daily. 270 capsule 0   QUEtiapine (SEROQUEL) 300 MG tablet TAKE 1.5 TAB (450 MG) BY  MOUTH AT BEDTIME 135 tablet 0   No current facility-administered medications for this visit.    Medication Side Effects: None sexual SE are better  Allergies: No Known Allergies  Past Medical History:  Diagnosis Date   Anxiety disorder    Bipolar 1 disorder (HCC)    Hyperlipidemia    Hypertension     History reviewed. No pertinent family history.  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  Tobacco Use   Smoking status: Never   Smokeless tobacco: Never  Substance and Sexual Activity   Alcohol use: Yes   Drug use: No   Sexual activity: Not on file  Other Topics Concern   Not on file  Social History Narrative   Not on file   Social Determinants of Health   Financial Resource Strain: Not on file  Food Insecurity: Not on file  Transportation Needs: Not on file  Physical Activity: Not on file  Stress: Not on file  Social Connections: Not on file  Intimate Partner Violence: Not on file    Past Medical History, Surgical history, Social history, and Family history were reviewed and updated as appropriate.   Please see review of systems for further details on the patient's review from today.   Objective:   Physical Exam:  There were no vitals taken for this visit.  Physical Exam Neurological:     Mental Status: He is alert and oriented to person, place, and time.     Cranial Nerves: No dysarthria.  Psychiatric:        Attention and Perception: Attention and perception normal.        Mood and Affect: Mood is anxious. Mood is not depressed. Affect is not labile or tearful.        Speech: Speech normal. Speech is not rapid and pressured, slurred or tangential.        Behavior: Behavior is not slowed. Behavior is  cooperative.        Thought Content: Thought content normal. Thought content is not paranoid or delusional. Thought content does not include homicidal or suicidal ideation. Thought content does not include suicidal plan.        Cognition and Memory: Cognition and memory normal.        Judgment: Judgment normal.     Comments: Insight fair to good Recent mania resolved with increase Seroquel to 450 mg HS    Lab Review:     Component Value Date/Time   NA 140 10/26/2020 0833   K 4.6 10/26/2020 0833   CL 104 10/26/2020 0833   CO2 23 10/26/2020 0833   GLUCOSE 104 (H) 10/26/2020 0833   GLUCOSE 107 (H) 10/06/2019 1534   BUN 13 10/26/2020 7510  CREATININE 1.39 (H) 10/26/2020 0833   CALCIUM 9.8 10/26/2020 0833   PROT 8.7 (H) 07/20/2015 1129   ALBUMIN 4.9 07/20/2015 1129   AST 37 07/20/2015 1129   ALT 51 07/20/2015 1129   ALKPHOS 79 07/20/2015 1129   BILITOT 0.6 07/20/2015 1129   GFRNONAA 59 (L) 10/26/2020 0833   GFRAA 68 10/26/2020 0833       Component Value Date/Time   WBC 3.4 (L) 09/15/2016 0500   RBC 4.37 (L) 09/15/2016 0500   HGB 13.3 09/15/2016 0500   HCT 38.7 (L) 09/15/2016 0500   PLT 242 09/15/2016 0500   MCV 88.6 09/15/2016 0500   MCH 30.4 09/15/2016 0500   MCHC 34.3 09/15/2016 0500   RDW 13.1 09/15/2016 0500   LYMPHSABS 2.3 07/20/2015 1129   MONOABS 0.2 07/20/2015 1129   EOSABS 0.1 07/20/2015 1129   BASOSABS 0.0 07/20/2015 1129    Lithium Lvl  Date Value Ref Range Status  06/06/2021 0.5 0.5 - 1.2 mmol/L Final    Comment:    Plasma concentration of 0.5 - 0.8 mmol/L are advised for long-term use; concentrations of up to 1.2 mmol/L may be necessary during acute treatment.                                  Detection Limit = 0.1                           <0.1 indicates None Detected     Normal TSH 0.851   No results found for: PHENYTOIN, PHENOBARB, VALPROATE, CBMZ   .res Assessment: Plan:    Bipolar I disorder ([place]) - Plan: lamoTRIgine (LAMICTAL) 200 MG  tablet, lithium carbonate 300 MG capsule, QUEtiapine (SEROQUEL) 300 MG tablet  Generalized anxiety disorder  Drug-induced weight gain - Plan: metFORMIN (GLUCOPHAGE) 500 MG tablet  Insomnia due to mental condition  Lithium-induced tremor  Obstructive sleep apnea  Lithium use   Greater than 50% of 30 min video face to face time with patient was spent on counseling and coordination of care. We discussed his diagnoses and answered questions re: bipolar dx, meds, SE. he has received additional parent benefit and mood consistency with the addition of the lithium.  He is tolerating in the dosage at 900 mg a day but did not tolerate 1200 mg a day very well.  The lithium tremor has been reduced over time.  If he has further problems with depression and irritability we could increase the dose to 1050 instead of 1200.  Discussed lithium jerks and tremors and reassured him that they are not permanent and are not associated with tardive dyskinesia.  Some chronic problems with not making appointments and following up as recommended as compromised the effectiveness of his treatment.  This was discussed.  Emphasized importance of consistency.  This is a problem and puts risk mania and cycling emphasized.  Had some manic symptoms recently.  Recent mania resolved with increase Seroquel to 450 mg HS  Option Vraylar 3 though 1.5 mg caused SE. Consider retry it later at 3 mg as he might tolerate it better with 2 nd trial if don't reduce Seroquel immediately.  Switched to Latuda 60 mg failed but that could be related to the dose being too low.  This was discussed at length with the option of retrying the Latuda and increasing to 80 or 120 mg before reducing the Seroquel.  He does not want to try that now and instead would prefer a trial of metformin for antipsychotic related weight gain. Discussed potential metabolic side effects associated with atypical antipsychotics, as well as potential risk for movement side  effects. Advised pt to contact office if movement side effects occur.   We discussed the short-term risks associated with benzodiazepines including sedation and increased fall risk among others.  Discussed long-term side effect risk including dependence, potential withdrawal symptoms, and the potential eventual dose-related risk of dementia.  But recent studies from 2020 dispute this association between benzodiazepines and dementia risk. Newer studies in 2020 do not support an association with dementia.  But did discuss risk of affecting memory in daytime.  Counseled patient regarding potential benefits, risks, and side effects of Lamictal to include potential risk of Stevens-Johnson syndrome. Advised patient to stop taking Lamictal and contact office immediately if rash develops and to seek urgent medical attention if rash is severe and/or spreading quickly.  Also emphasized the need for conisistency with lamotrigine dosing otherwise there is an increased risk of rash.  Disc potential effect on B vitamin levels.   Disc increased rash risk with inconsistency.  Watch alcohol.  Disc with him.  Disc alcohol and dehydration and risk lithium toxicity .  Counseled patient regarding potential benefits, risks, and side effects of lithium to include potential risk of lithium affecting thyroid and renal function.  Discussed need for periodic lab monitoring to determine drug level and to assess for potential adverse effects.  Counseled patient regarding signs and symptoms of lithium toxicity and advised that they notify office immediately or seek urgent medical attention if experiencing these signs and symptoms.  Patient advised to contact office with any questions or concerns.  06/06/21 lithium level 0.5. Continue Lithium 900 mg daily.  continue  Seroquel 450mg  nightly Had mania at lower dose and sedation at higher dose.  For antipsychotic related weight gain we will add metformin 500 mg daily for 1 week then  500 mg twice daily if tolerated since the attempt to switch from Seroquel to [place] failed.  Disc risk relapse with change.  Continue FMLA to have leave for episodic flares of condition intermittent estimated 1-5 days, frequency approximately every 2 mos.  This satisfied his work.  Continue lamotrigine 200 mg BID  trial propranolol 40-60 mg BID prn anxiety.  OK use with Xanax.  Need try to reduce Xanax dose.  He can take Lunesta 3 mg nightly but he cannot increase the dose above that.  Discussed the risks and in his case necessity of polypharmacy.  He is agreeable to continue the current meds.  FU 6 weeks  Lynder Parents, MD, DFAPA   Please see After Visit Summary for patient specific instructions.  No future appointments.   No orders of the defined types were placed in this encounter.       -------------------------------

## 2021-12-31 DIAGNOSIS — Z0289 Encounter for other administrative examinations: Secondary | ICD-10-CM

## 2021-12-31 NOTE — Telephone Encounter (Signed)
Paper work completed. Will have Dr. Jennelle Human review and sign

## 2022-01-22 ENCOUNTER — Other Ambulatory Visit: Payer: Self-pay | Admitting: Psychiatry

## 2022-01-22 DIAGNOSIS — T50905A Adverse effect of unspecified drugs, medicaments and biological substances, initial encounter: Secondary | ICD-10-CM

## 2022-01-22 DIAGNOSIS — F319 Bipolar disorder, unspecified: Secondary | ICD-10-CM

## 2022-01-22 DIAGNOSIS — R635 Abnormal weight gain: Secondary | ICD-10-CM

## 2022-02-05 ENCOUNTER — Other Ambulatory Visit: Payer: Self-pay | Admitting: Psychiatry

## 2022-02-05 DIAGNOSIS — R635 Abnormal weight gain: Secondary | ICD-10-CM

## 2022-02-26 ENCOUNTER — Telehealth (INDEPENDENT_AMBULATORY_CARE_PROVIDER_SITE_OTHER): Payer: 59 | Admitting: Psychiatry

## 2022-02-26 ENCOUNTER — Encounter: Payer: Self-pay | Admitting: Psychiatry

## 2022-02-26 DIAGNOSIS — F5105 Insomnia due to other mental disorder: Secondary | ICD-10-CM | POA: Diagnosis not present

## 2022-02-26 DIAGNOSIS — F411 Generalized anxiety disorder: Secondary | ICD-10-CM | POA: Diagnosis not present

## 2022-02-26 DIAGNOSIS — Z79899 Other long term (current) drug therapy: Secondary | ICD-10-CM

## 2022-02-26 DIAGNOSIS — F319 Bipolar disorder, unspecified: Secondary | ICD-10-CM

## 2022-02-26 DIAGNOSIS — R635 Abnormal weight gain: Secondary | ICD-10-CM

## 2022-02-26 DIAGNOSIS — G4733 Obstructive sleep apnea (adult) (pediatric): Secondary | ICD-10-CM

## 2022-02-26 DIAGNOSIS — G251 Drug-induced tremor: Secondary | ICD-10-CM

## 2022-02-26 DIAGNOSIS — T50905A Adverse effect of unspecified drugs, medicaments and biological substances, initial encounter: Secondary | ICD-10-CM

## 2022-02-26 MED ORDER — LITHIUM CARBONATE 300 MG PO CAPS: 900.0000 mg | ORAL_CAPSULE | Freq: Every day | ORAL | 0 refills | Status: DC

## 2022-02-26 MED ORDER — QUETIAPINE FUMARATE 300 MG PO TABS: ORAL_TABLET | ORAL | 0 refills | Status: DC

## 2022-02-26 MED ORDER — METFORMIN HCL 500 MG PO TABS: 1000.0000 mg | ORAL_TABLET | Freq: Two times a day (BID) | ORAL | 1 refills | Status: DC

## 2022-02-26 MED ORDER — ESZOPICLONE 3 MG PO TABS: 3.0000 mg | ORAL_TABLET | Freq: Every day | ORAL | 1 refills | Status: DC

## 2022-02-26 MED ORDER — LAMOTRIGINE 200 MG PO TABS: 200.0000 mg | ORAL_TABLET | Freq: Two times a day (BID) | ORAL | 1 refills | Status: DC
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
## 2022-06-05 ENCOUNTER — Telehealth: Payer: 59 | Admitting: Psychiatry

## 2022-06-19 ENCOUNTER — Other Ambulatory Visit: Payer: Self-pay | Admitting: Psychiatry

## 2022-06-19 DIAGNOSIS — F319 Bipolar disorder, unspecified: Secondary | ICD-10-CM

## 2022-06-20 ENCOUNTER — Other Ambulatory Visit: Payer: Self-pay

## 2022-06-20 DIAGNOSIS — F5105 Insomnia due to other mental disorder: Secondary | ICD-10-CM

## 2022-06-23 MED ORDER — ALPRAZOLAM 0.5 MG PO TABS: 0.5000 mg | ORAL_TABLET | Freq: Three times a day (TID) | ORAL | 0 refills | Status: DC | PRN

## 2022-06-23 MED ORDER — ESZOPICLONE 3 MG PO TABS: ORAL_TABLET | ORAL | 0 refills | Status: DC

## 2022-08-26 ENCOUNTER — Telehealth: Payer: 59 | Admitting: Psychiatry

## 2022-10-16 ENCOUNTER — Other Ambulatory Visit: Payer: Self-pay | Admitting: Psychiatry

## 2022-10-16 DIAGNOSIS — F319 Bipolar disorder, unspecified: Secondary | ICD-10-CM

## 2022-10-16 DIAGNOSIS — F5105 Insomnia due to other mental disorder: Secondary | ICD-10-CM

## 2022-10-16 NOTE — Telephone Encounter (Signed)
Xanax and lunesta filled 8/8 appt 12/03/22

## 2022-12-03 ENCOUNTER — Telehealth (INDEPENDENT_AMBULATORY_CARE_PROVIDER_SITE_OTHER): Payer: 59 | Admitting: Psychiatry

## 2022-12-03 ENCOUNTER — Encounter: Payer: Self-pay | Admitting: Psychiatry

## 2022-12-03 DIAGNOSIS — G251 Drug-induced tremor: Secondary | ICD-10-CM

## 2022-12-03 DIAGNOSIS — T50905A Adverse effect of unspecified drugs, medicaments and biological substances, initial encounter: Secondary | ICD-10-CM

## 2022-12-03 DIAGNOSIS — F3110 Bipolar disorder, current episode manic without psychotic features, unspecified: Secondary | ICD-10-CM

## 2022-12-03 DIAGNOSIS — F5105 Insomnia due to other mental disorder: Secondary | ICD-10-CM

## 2022-12-03 DIAGNOSIS — F411 Generalized anxiety disorder: Secondary | ICD-10-CM

## 2022-12-03 DIAGNOSIS — G4733 Obstructive sleep apnea (adult) (pediatric): Secondary | ICD-10-CM

## 2022-12-03 DIAGNOSIS — Z79899 Other long term (current) drug therapy: Secondary | ICD-10-CM

## 2022-12-03 DIAGNOSIS — R635 Abnormal weight gain: Secondary | ICD-10-CM | POA: Diagnosis not present

## 2022-12-03 MED ORDER — LITHIUM CARBONATE 300 MG PO CAPS: 1200.0000 mg | ORAL_CAPSULE | Freq: Every day | ORAL | 0 refills | Status: DC

## 2022-12-03 MED ORDER — QUETIAPINE FUMARATE 300 MG PO TABS: ORAL_TABLET | ORAL | 0 refills | Status: DC

## 2022-12-03 MED ORDER — ALPRAZOLAM 0.5 MG PO TABS: ORAL_TABLET | ORAL | 1 refills | Status: DC

## 2022-12-03 MED ORDER — LAMOTRIGINE 200 MG PO TABS: 200.0000 mg | ORAL_TABLET | Freq: Two times a day (BID) | ORAL | 1 refills | Status: DC

## 2022-12-03 NOTE — Progress Notes (Signed)
Michael Mclean [id number] 1970-11-19 52 y.o.  Video Visit via My Chart  I connected with pt by My Chart and verified that I am speaking with the correct person using two identifiers.   I discussed the limitations, risks, security and privacy concerns of performing an evaluation and management service by My Chart  and the availability of in person appointments. I also discussed with the patient that there may be a patient responsible charge related to this service. The patient expressed understanding and agreed to proceed.  I discussed the assessment and treatment plan with the patient. The patient was provided an opportunity to ask questions and all were answered. The patient agreed with the plan and demonstrated an understanding of the instructions.   The patient was advised to call back or seek an in-person evaluation if the symptoms worsen or if the condition fails to improve as anticipated.  I provided 30 minutes of video time during this encounter.  The patient was located at home and the provider was located office.  Session started at [phone] Subjective:   Patient ID:  Michael Mclean is a 52 y.o. (DOB Mar 21, 1971) male.  Chief Complaint:  Chief Complaint  Patient presents with   Follow-up   Anxiety   Depression   Tremors    Medication Refill Pertinent negatives include no fatigue, nausea or weakness.  Anxiety Patient reports no confusion, decreased concentration, dizziness, nausea, nervous/anxious behavior, palpitations, shortness of breath or suicidal ideas.    Depression        Associated symptoms include no decreased concentration, no fatigue and no suicidal ideas.  Past medical history includes anxiety.    Michael Mclean is due for follow-up of bipolar disorder.  seen August 11, 2019.  Due to erectile dysfunction Seroquel XR was reduced to 600 mg daily from 800 mg daily.  Also lithium level and labs were ordered.  seen October 06, 2019.  Serum lithium level,  TSH, BMP.  Check it.  He wants to go now this afternoon.  It will not be a trough but it is hard for him to get off of work.  We will expect a lower than usual blood level. Start Vraylar 1.5 mg 1 capsule daily Reduce Seroquel XR to 1 1/2 nightly for 1 week then 1 nightly  seen November 17, 2019.  The following changes were noted: Reduce Vraylar 1.5 mg to every other daily DT restlessness change Seroquel 300mg   1 nightly to improve his sleep which should also help with mood stability.  He called January 15 complaining of insomnia.  Had taken up to 6 Lunesta and 1 night!  He was informed this was dangerous and not to do so again.  He was instructed to increase the quetiapine by an extra half tablet and to increase the Vraylar to 3 mg daily.  It was felt he was having some manic symptoms.  01/26/2020 appointment with the following noted: Admits he had manic sx in January.  Resolved with med changes.  Had to increase the Seroquel to control mania.   Mania had affected work and insomnia.  Tried Vraylar in hopes of being able to reduce eliminate Seroquel.  Was missing work, but not now.  But having problem with FMLA with new supervisor.  Says the episodes have been up to 5 days but the FMLA says leaves would be anticipated for 7-14 days.    Usual Xanax is 2 tablets every 3 days for anxiety. Pretty good.  Job is still really stressful and  only real concern. .  Lasts 3 mos.  Job is very stressful. And volume demands.  Lives in [place]. Married June 2020 and right after fell apart for 3-5 days.  Lost control for a little but normal again now.   Seroquel and lithium work well for sleep and mood.  No problems with reduction in Seroquel in mood and the sexual SE are better.  Used Viagra twice. He reduced the lithium back to 900 mg bc felt like it was all that was necessary.  A little scared to increase the lithium bc hand jerks at times.  Some tremor.  Scares him.  Little anger an impulsivity at this time  but is a little.  Notices he can't drink much with the lithiium.  When drinks he does too much and may not sleep and then has a really negative effect on him. Lithium has helped manage with less anger and impulsivity.  Pleased with the response.  Did not have this level of control before.  Wants to try to get on less meds and his fiancee' is suggesting it.  Feels guilty about being dependent on the meds.  Plan: It was felt Arman Filter was probably not having significant clinical effect and in order to reduce polypharmacy it was discontinued.  05/11/2020 appointment with the following noted: Taking Xanax prn.  Disc FMLA amendment helpful.  Tolerating meds.   Overall doing relatively well.  One period when couldn't work for 2-3 days DT anxiety and irritability.  Otherwise better with change lifestyle with marriage and better routine. No problems off the Vraylar.  The increase Seroquel in the past helped sleep and mood and only needed Lunesta 2-3 times since here.  Work function is typical.  Remains stressful job.  Expects that and handling it a little better.  Better self confidence.   Wife also started working at [employer] too.   Plan: no med changes  08/27/20 appt with following noted: Episode early July after out of lamotrigine for several weeks.  Got in huge fight with wife and missed a week of work.  Otherwise is OK since then.   Out of Armodafinil now for 4-6 weeks.  Managing with mild sleepiness.  Sleep is OK usually with prn. Minimal tremor. Patient reports stable mood and denies depressed or irritable moods usually.  Patient has a little more recent difficulty with anxiety.   Denies appetite disturbance.  Patient reports that energy and motivation have been good.  Patient denies any difficulty with concentration.  Patient denies any suicidal ideation. Plan: no med changes.  Check lithium level.  01/07/21 appt noted: DC from hospital Michael Mclean on Friday 01/05/20.  During week drinks only a  couple and more on weekend.  Plans to drink only weeknds now. Admitted 12/26/20 with emotions of anger and sorry and regret over prior decisions. What put him over was Ex and he has no say over his daughter's care.  Cut his wrists in [place].  Wife caught him in the process.   Gabapentin 300 added for anxiety.  Not sure if med changes helped.  No fatigue. SE more tremor.  Anxiety still present. Don't have sorrow and anger he had before.  I feel better without SI. Slept OK weekend overall except anxiety about the situation.  Taking Lunesta nightly.  ? Re FMLA. Plan: Increase gabapentin 600 mg TID for anxiety and tremor.  01/18/21 appt: Better off gabapentin DT sexual SE about 3 days ago.  Didn't help anxiety or  tremor. Still some anxiety and job stress but handled it OK.   Missed no sig days since here.  Work function not good the first week back. Right now not depressed and not suicidal.  No weapons in the house.  Last SI in hospital. Stress from child's mother and bills can cause him to spiral somewhat.   Taking Lunesta consistently.  Taking Xanax TID and no SE. Plan no med changes  02/13/2021 appt noted: Even keel .  No SI.  Better than last time.  Learned things to help him not get back in the way he was in [place].  Problems with lamotrigine refill. Missed 5-7 days of lamotrigine.  Learned from classes at the hospital.   RTW 2.22.22.  They made a lot of changes there so it's a challenge with fast pace.  Doing well so far.  Louann Sjogren says he's doing OK. Sleep good with Lunesta Seroquel.   Plan: Restart lamotrigine 1/2 of 200 mg tablet for 5 days, then 1 tablet daily for 5 days, then 1 and 1/2 tablets daily for 5 days then 1 twice daily.  10/2021 patient's wife called concerned because he was cutting himself.  Was not a suicide attempt. 04/01/2021 patient evaluated in the ER.  And diagnosed with vertigo  05/17/2021 appointment with the following noted: Vertigo lasted about 2 mos off and on.   Says wife got  info about cutting were gone.  He had scratched himself.  He was not suicidal and did not cut himself.  He is not cutting. Lately overall more stable but is under a bit of stress and is anxious.  Seems like the Xanax is not working as well.  Wonders about switching BZ. Increased work demands.   Sometimes some manic sx with increased talking and hyperactivity including last week.  Depression managed.   Sleeep is good.  Impulsivity managed bc debt.   Plan: Get lithium level today. Continue Seroquel 450mg  nightly to improve his sleep which should also help with mood stability.  Had mania at lower dose and sedation at higher dose.  Polypharmacy in hopes lithium alone will be sufficient at preventing mania. Continue FMLA to have leave for episodic flares of condition intermittent estimated 1-5 days, frequency approximately every 2 mos.  This satisfied his work. Continue lamotrigine 200 mg BID OK trial clonazepam 0.5 mg TID  07/10/2021 phone call: Next visit is 09/16/21. Nyko called and said that the Seroquel and Lithium he has been taking is causing weight gain and is making his hands swollen. He would like to discuss  the side effects and what he can do to avoid these from happening.  He was told that these are major and important mood stabilizing medications for him and we cannot make these changes without an appointment.  09/16/2021 appointment with the following noted: Takes clonazepam 0.5 and 1 mg rarely and not a lot of effectiveness so still using Xanax 0.5 mg but takes 3-4 tablets twice daily if having bad day of anxiety.  Denies SE.  Takes Xanax about once daily routinely. Average 2-4 daily. Taking lamotrigine 200 mg BID, Seroquel 450.  Ran out past few days. Doesn't sleep well without meds. Problems with child's mother and huge disagreements over caretaking and child support. Really stressful job, over scrutinized so lives anxiety 7-8/10 and better after work. Concerned about meds and  weight gain. Hungry and thinks it's Seroquel. Plan:  RE:  Seroquel 450mg  nightly Had mania at lower dose and sedation at higher dose.  Start Latuda 30 mg for 1 week then 60 mg daily. Continue FMLA to have leave for episodic flares of condition intermittent estimated 1-5 days, frequency approximately every 2 mos.  This satisfied his work. Continue lamotrigine 200 mg BID trial propranolol 40-60 mg BID prn anxiety.  OK use with Xanax.  Need try to reduce Xanax dose.  Follow-up 6 weeks  10/16/2021 phone call this patient was content fused about how he was supposed to be taking Latuda and Seroquel.  He was taking it appropriately at quetiapine 450 mg nightly and Latuda 60 mg p.m.  With a goal of weaning the Seroquel he was encouraged to reduce that to 300 mg nightly.  11/27/2021 phone call complaining of difficulty sleeping with the lower dose of Seroquel and wanting to increase it back to 450 mg nightly.  This is an increase was agreed.  12/13/2021 phone call: Complaining of poor sleep and more mania. He had not increased the Seroquel to 450 mg daily and was taking Latuda 60 mg and wanted to stop it.  12/30/2021 appointment the following noted:  Didn't like Latuda bc felt he was more manic on it with more spending and credit card debt.   Off the Latuda about 3 weeks. Rather deal with the weight than have unstable mood. Mood is more stable back on Seroquel 450 mg nightly with the lithium 900 mg nightly.  He is not manic currently.  He is needing the Lunesta to sleep.  Admits to taking 2 at a time on occasion. No longer drinks too much. He sees benefit.  [name] says he's well.   Plan: Continue Lithium 900 mg daily. continue  Seroquel 450mg  nightly Had mania at lower dose and sedation at higher dose. For antipsychotic related weight gain we will add metformin 500 mg daily for 1 week then 500 mg twice daily if tolerated since the attempt to switch from Seroquel to Latuda failed.  02/26/22 appt  noted: Asks for Lunesta bc often needs something to help him go to sleep.   Mood is better.  Handled stressors.  Patient reports stable mood and denies depressed or irritable moods.  Patient denies any recent difficulty with anxiety.  Patient denies difficulty with sleep initiation or maintenance. Denies appetite disturbance.  Patient reports that energy and motivation have been good.  Patient denies any difficulty with concentration.  Patient denies any suicidal ideation. Wife OK after pacemaker replacmement. No benefit with metformin 500 BID for weight and willing to increase. No othert SE  12/03/22 appt noted: Meds: Seroquel 450 HS, lithium 900 mg HS, lamotrigine 200 mg   , metformin 500 mg BID (SE diarrhea 1000 mg BID) Uses Xanax 0.5 mg prn anxiety. No SE.  Not using Lunesta BC $25/RX Thinks he went through manic stage with spending and credit cards in April and [place] in September.  He doesn't recognize mania at the time but looking back on it sees it.  Did chapter 25. Mood is OK at present and relieved bc not getting creditor calls. Usual seasonal depression is OK> No SE with meds. He says wife thinks he argues too much.  No excess spending and ok function at work.   Sleep ok with meds.  Past Psychiatric Medication Trials:  Depakote paranoia, topiramate, Seroquel 800 sedating, lamotrigine, Abilify 20 ,  Vraylar 3 mg restless and couldn't sleep,  Latuda 60 mg with Seroquel 300 mg mania lithium 1200 side effect  Gabapentin NR and SE Nuvigil,  Wellbutrin, Viibryd,  trazodone 400,  Sonata, amitriptyline, Ambien, Lunesta worked Xanax, Klonopin 1 mg    Review of Systems:  Review of Systems  Constitutional:  Positive for unexpected weight change. Negative for fatigue.  Eyes:  Positive for pain.  Respiratory:  Negative for shortness of breath.   Cardiovascular:  Negative for palpitations.       Hypertension  Gastrointestinal:  Negative for nausea.  Neurological:  Positive for  tremors. Negative for dizziness and weakness.  Psychiatric/Behavioral:  Positive for sleep disturbance. Negative for agitation, behavioral problems, confusion, decreased concentration, dysphoric mood, hallucinations, self-injury and suicidal ideas. The patient is not nervous/anxious and is not hyperactive.     Medications: I have reviewed the patient's current medications.  Current Outpatient Medications  Medication Sig Dispense Refill   amLODipine (NORVASC) 10 MG tablet Take 10 mg by mouth daily.     atorvastatin (LIPITOR) 40 MG tablet Take 40 mg by mouth daily.     Eszopiclone 3 MG TABS TAKE 1 TABLET IMMEDIATELY BEFORE BEDTIME 90 tablet 0   ibuprofen (ADVIL,MOTRIN) 400 MG tablet Take 1 tablet (400 mg total) by mouth every 6 (six) hours as needed for moderate pain. 12 tablet 0   metFORMIN (GLUCOPHAGE) 500 MG tablet Take 2 tablets (1,000 mg total) by mouth 2 (two) times daily with a meal. 360 tablet 1   sildenafil (VIAGRA) 100 MG tablet Take by mouth.     valACYclovir (VALTREX) 500 MG tablet Take 500 mg by mouth. Takes for 5 days     ALPRAZolam (XANAX) 0.5 MG tablet TAKE 1 TABLET THREE TIMES A DAY AS NEEDED FOR ANXIETY 90 tablet 1   lamoTRIgine (LAMICTAL) 200 MG tablet Take 1 tablet (200 mg total) by mouth 2 (two) times daily. 180 tablet 1   lithium carbonate 300 MG capsule Take 4 capsules (1,200 mg total) by mouth daily. 360 capsule 0   propranolol (INDERAL) 40 MG tablet TAKE 1 TO 2 TABLETS BY MOUTH TWICE A DAY AS NEEDED FOR ANXIETY 100 tablet 0   QUEtiapine (SEROQUEL) 300 MG tablet TAKE ONE AND ONE-HALF TABLETS AT BEDTIME 135 tablet 0   No current facility-administered medications for this visit.    Medication Side Effects: None sexual SE are better  Allergies: No Known Allergies  Past Medical History:  Diagnosis Date   Anxiety disorder    Bipolar 1 disorder ([place])    Hyperlipidemia    Hypertension     History reviewed. No pertinent family history.  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  Tobacco Use   Smoking status: Never   Smokeless tobacco: Never  Substance and Sexual Activity   Alcohol use: Yes   Drug use: No   Sexual activity: Not on file  Other Topics Concern   Not on file  Social History Narrative   Not on file   Social Determinants of Health   Financial Resource Strain: Not on file  Food Insecurity: Not on file  Transportation Needs: Not on file  Physical Activity: Not on file  Stress: Not on file  Social Connections: Not on file  Intimate Partner Violence: Not on file    Past Medical History, Surgical history, Social history, and Family history were reviewed and updated as appropriate.  Please see review of systems for further details on the patient's review from today.   Objective:   Physical Exam:  There were no vitals taken for this visit.  Physical Exam Neurological:     Mental Status: He is alert and oriented to person, place, and time.     Cranial Nerves: No dysarthria.  Psychiatric:        Attention and Perception: Attention and perception normal.        Mood and Affect: Mood is not anxious or depressed. Affect is not labile or tearful.        Speech: Speech normal. Speech is not rapid and pressured, slurred or tangential.        Behavior: Behavior is not slowed. Behavior is cooperative.        Thought Content: Thought content normal. Thought content is not paranoid or delusional. Thought content does not include homicidal or suicidal ideation. Thought content does not include suicidal plan.        Cognition and Memory: Cognition and memory normal.        Judgment: Judgment normal.     Comments: Insight fair to good Denies mania now     Lab Review:      Component Value Date/Time   NA 140 10/26/2020 0833   K 4.6 10/26/2020 0833   CL 104 10/26/2020 0833   CO2 23 10/26/2020 0833   GLUCOSE 104 (H) 10/26/2020 0833   GLUCOSE 107 (H) 10/06/2019 1534   BUN 13 10/26/2020 0833   CREATININE 1.39 (H) 10/26/2020 0833   CALCIUM 9.8 10/26/2020 0833   PROT 8.7 (H) 07/20/2015 1129   ALBUMIN 4.9 07/20/2015 1129   AST 37 07/20/2015 1129   ALT 51 07/20/2015 1129   ALKPHOS 79 07/20/2015 1129   BILITOT 0.6 07/20/2015 1129   GFRNONAA 59 (L) 10/26/2020 0833   GFRAA 68 10/26/2020 0833       Component Value Date/Time   WBC 3.4 (L) 09/15/2016 0500   RBC 4.37 (L) 09/15/2016 0500   HGB 13.3 09/15/2016 0500   HCT 38.7 (L) 09/15/2016 0500   PLT 242 09/15/2016 0500   MCV 88.6 09/15/2016 0500   MCH 30.4 09/15/2016 0500   MCHC 34.3 09/15/2016 0500   RDW 13.1 09/15/2016 0500   LYMPHSABS 2.3 07/20/2015 1129   MONOABS 0.2 07/20/2015 1129   EOSABS 0.1 07/20/2015 1129   BASOSABS 0.0 07/20/2015 1129    Lithium Lvl  Date Value Ref Range Status  06/06/2021 0.5 0.5 - 1.2 mmol/L Final    Comment:    Plasma concentration of 0.5 - 0.8 mmol/L are advised for long-term use; concentrations of up to 1.2 mmol/L may be necessary during acute treatment.                                  Detection Limit = 0.1                           <0.1 indicates None Detected      06/17/22 Cr 1.5, TSH 1.85, lipids done by PCP   No results found for: "PHENYTOIN", "PHENOBARB", "VALPROATE", "CBMZ"   .res Assessment: Plan:    Bipolar I disorder, most recent episode (or current) manic (HCC) - Plan: lithium carbonate 300 MG capsule, Lithium level, lamoTRIgine (LAMICTAL) 200 MG tablet, QUEtiapine (SEROQUEL) 300 MG tablet  Generalized anxiety disorder - Plan: ALPRAZolam (XANAX) 0.5 MG  tablet  Drug-induced weight gain  Insomnia due to mental condition  Lithium-induced tremor  Obstructive sleep apnea  Lithium use - Plan: lithium carbonate 300 MG capsule, Lithium level,  Basic metabolic panel   Greater than 69% of 30 min video face to face time with patient was spent on counseling and coordination of care. We discussed his diagnoses and answered questions re: bipolar dx, meds, SE. he had received additional parent benefit and mood consistency with the addition of the lithium.  He is tolerating in the dosage at 900 mg a day but did not tolerate 1200 mg a day very well.  But DT mania in 2023 he's willing to retry lithium 1200 mg daily. he lithium tremor has been reduced over time.    Discussed lithium jerks and tremors and reassured him that they are not permanent and are not associated with tardive dyskinesia.  Some chronic problems with not making appointments and following up as recommended as compromised the effectiveness of his treatment.  This was discussed.  Emphasized importance of consistency.  This is a problem and puts risk mania and cycling emphasized.  Discussed potential metabolic side effects associated with atypical antipsychotics, as well as potential risk for movement side effects. Advised pt to contact office if movement side effects occur.   We discussed the short-term risks associated with benzodiazepines including sedation and increased fall risk among others.  Discussed long-term side effect risk including dependence, potential withdrawal symptoms, and the potential eventual dose-related risk of dementia.  But recent studies from 2020 dispute this association between benzodiazepines and dementia risk. Newer studies in 2020 do not support an association with dementia.  But did discuss risk of affecting memory in daytime.  Counseled patient regarding potential benefits, risks, and side effects of Lamictal to include potential risk of Stevens-Johnson syndrome. Advised patient to stop taking Lamictal and contact office immediately if rash develops and to seek urgent medical attention if rash is severe and/or spreading quickly.  Also emphasized the need  for conisistency with lamotrigine dosing otherwise there is an increased risk of rash.  Disc potential effect on B vitamin levels.   Disc increased rash risk with inconsistency.  Watch alcohol.  Disc with him.  Disc alcohol and dehydration and risk lithium toxicity .  Counseled patient regarding potential benefits, risks, and side effects of lithium to include potential risk of lithium affecting thyroid and renal function.  Discussed need for periodic lab monitoring to determine drug level and to assess for potential adverse effects.  Counseled patient regarding signs and symptoms of lithium toxicity and advised that they notify office immediately or seek urgent medical attention if experiencing these signs and symptoms.  Patient advised to contact office with any questions or concerns. Has some CR creep. Will repeat BMP  06/06/21 lithium level 0.5. Retry Lithium 1200 mg daily bc mania spring 2023.  Need another level and BMP  continue  Seroquel  nightly Had mania at lower dose and sedation at higher dose.  For antipsychotic related weight gain we will add metformin 500 mg daily for 1 week then 500 mg twice daily if tolerated since the attempt to switch from Seroquel to [place] and Vraylar failed.  Disc risk relapse with change.  Continue FMLA to have leave for episodic flares of condition intermittent estimated 1-5 days, frequency approximately every 2 mos.  This satisfied his work and needs to be renewed.    Continue lamotrigine 200 mg BID  trial propranolol 40-60 mg BID prn anxiety.  OK use with Xanax.  Need try to reduce Xanax dose.  He can take Lunesta 3 mg nightly but he cannot increase the dose above that.  Disc the off-label use of N-Acetylcysteine at 1200 mg daily to help with mild cognitive problems.  It can be combined with a B-complex vitamin as the B-12 and folate have been shown to sometimes enhance the effect. This is for complaints of forgetfulness.  Discussed the risks  and in his case necessity of polypharmacy.  He is agreeable to continue the current meds.  FU 6 weeks  Meredith Staggersarey Cottle, MD, DFAPA   Please see After Visit Summary for patient specific instructions.  No future appointments.   Orders Placed This Encounter  Procedures   Lithium level   Basic metabolic panel        -------------------------------

## 2022-12-19 ENCOUNTER — Telehealth: Payer: Self-pay | Admitting: Psychiatry

## 2022-12-19 NOTE — Telephone Encounter (Signed)
Thanks he called about that

## 2022-12-19 NOTE — Telephone Encounter (Signed)
Received FMLA form re: patient. Placed in Traci's box to complete.

## 2022-12-22 NOTE — Telephone Encounter (Signed)
Pt lvm with another fax # to use [phone] if needed. Stated, there is a fax # on paperwork, but this is a second one. [place] is very pushy and wanted to give all info needed.

## 2022-12-24 NOTE — Telephone Encounter (Signed)
Pt called at 11:50a saying [employer] is going to cancel his claim if they don't get this paperwork.  I spoke to [facility] and she said she would get it sent today. He said to use the fax numbers [phone] or [phone].    No upcoming appt scheduled.

## 2022-12-25 NOTE — Telephone Encounter (Signed)
Last years paper work printed and updated with current year for Intermittent FMLA, also faxed per request.

## 2022-12-25 NOTE — Telephone Encounter (Signed)
Pt LVM 2/7 @ 6:30p.  He said Traci told him there were no blanks to fill int.  He said yes, it's ok to use the info from last year.  They need all the papers together.  There are 3 forms and they are missing the one from [place].

## 2023-03-26 ENCOUNTER — Encounter: Payer: Self-pay | Admitting: Psychiatry

## 2023-03-26 ENCOUNTER — Telehealth (INDEPENDENT_AMBULATORY_CARE_PROVIDER_SITE_OTHER): Payer: 59 | Admitting: Psychiatry

## 2023-03-26 DIAGNOSIS — F5105 Insomnia due to other mental disorder: Secondary | ICD-10-CM

## 2023-03-26 DIAGNOSIS — T50905A Adverse effect of unspecified drugs, medicaments and biological substances, initial encounter: Secondary | ICD-10-CM

## 2023-03-26 DIAGNOSIS — R635 Abnormal weight gain: Secondary | ICD-10-CM

## 2023-03-26 DIAGNOSIS — F411 Generalized anxiety disorder: Secondary | ICD-10-CM

## 2023-03-26 DIAGNOSIS — F3110 Bipolar disorder, current episode manic without psychotic features, unspecified: Secondary | ICD-10-CM | POA: Diagnosis not present

## 2023-03-26 DIAGNOSIS — G4733 Obstructive sleep apnea (adult) (pediatric): Secondary | ICD-10-CM

## 2023-03-26 DIAGNOSIS — G251 Drug-induced tremor: Secondary | ICD-10-CM

## 2023-03-26 DIAGNOSIS — Z79899 Other long term (current) drug therapy: Secondary | ICD-10-CM

## 2023-03-26 MED ORDER — PRIMIDONE 50 MG PO TABS
ORAL_TABLET | ORAL | 0 refills | Status: DC
Start: 2023-03-26 — End: 2023-05-12

## 2023-03-26 NOTE — Progress Notes (Signed)
Michael Mclean [id number] 1971-09-19 52 y.o.  Video Visit via My Chart  I connected with pt by My Chart and verified that I am speaking with the correct person using two identifiers.   I discussed the limitations, risks, security and privacy concerns of performing an evaluation and management service by My Chart  and the availability of in person appointments. I also discussed with the patient that there may be a patient responsible charge related to this service. The patient expressed understanding and agreed to proceed.  I discussed the assessment and treatment plan with the patient. The patient was provided an opportunity to ask questions and all were answered. The patient agreed with the plan and demonstrated an understanding of the instructions.   The patient was advised to call back or seek an in-person evaluation if the symptoms worsen or if the condition fails to improve as anticipated.  I provided 30 minutes of video time during this encounter.  The patient was located at home and the provider was located office.  Session started at [phone]  Subjective:   Patient ID:  Michael Mclean is a 52 y.o. (DOB 22-Dec-1970) male.  Chief Complaint:  Chief Complaint  Patient presents with   Follow-up   Depression   Anxiety    Medication Refill Pertinent negatives include no fatigue, nausea or weakness.  Anxiety Symptoms include nervous/anxious behavior. Patient reports no confusion, decreased concentration, dizziness, nausea, palpitations or suicidal ideas.    Depression        Associated symptoms include no decreased concentration, no fatigue and no suicidal ideas.  Past medical history includes anxiety.    Michael Mclean is due for follow-up of bipolar disorder.  seen August 11, 2019.  Due to erectile dysfunction Seroquel XR was reduced to 600 mg daily from 800 mg daily.  Also lithium level and labs were ordered.  seen October 06, 2019.  Serum lithium level, TSH, BMP.  Check  it.  He wants to go now this afternoon.  It will not be a trough but it is hard for him to get off of work.  We will expect a lower than usual blood level. Start Vraylar 1.5 mg 1 capsule daily Reduce Seroquel XR to 1 1/2 nightly for 1 week then 1 nightly  seen November 17, 2019.  The following changes were noted: Reduce Vraylar 1.5 mg to every other daily DT restlessness change Seroquel 300mg   1 nightly to improve his sleep which should also help with mood stability.  He called January 15 complaining of insomnia.  Had taken up to 6 Lunesta and 1 night!  He was informed this was dangerous and not to do so again.  He was instructed to increase the quetiapine by an extra half tablet and to increase the Vraylar to 3 mg daily.  It was felt he was having some manic symptoms.  01/26/2020 appointment with the following noted: Admits he had manic sx in January.  Resolved with med changes.  Had to increase the Seroquel to control mania.   Mania had affected work and insomnia.  Tried Vraylar in hopes of being able to reduce eliminate Seroquel.  Was missing work, but not now.  But having problem with FMLA with new supervisor.  Says the episodes have been up to 5 days but the FMLA says leaves would be anticipated for 7-14 days.    Usual Xanax is 2 tablets every 3 days for anxiety. Pretty good.  Job is still really stressful and only real concern. Marland Kitchen  Lasts 3 mos.  Job is very stressful. And volume demands.  Lives in [place]. Married June 2020 and right after fell apart for 3-5 days.  Lost control for a little but normal again now.   Seroquel and lithium work well for sleep and mood.  No problems with reduction in Seroquel in mood and the sexual SE are better.  Used Viagra twice. He reduced the lithium back to 900 mg bc felt like it was all that was necessary.  A little scared to increase the lithium bc hand jerks at times.  Some tremor.  Scares him.  Little anger an impulsivity at this time but is a little.   Notices he can't drink much with the lithiium.  When drinks he does too much and may not sleep and then has a really negative effect on him. Lithium has helped manage with less anger and impulsivity.  Pleased with the response.  Did not have this level of control before.  Wants to try to get on less meds and his fiancee' is suggesting it.  Feels guilty about being dependent on the meds.  Plan: It was felt [name] was probably not having significant clinical effect and in order to reduce polypharmacy it was discontinued.  05/11/2020 appointment with the following noted: Taking Xanax prn.  Disc FMLA amendment helpful.  Tolerating meds.   Overall doing relatively well.  One period when couldn't work for 2-3 days DT anxiety and irritability.  Otherwise better with change lifestyle with marriage and better routine. No problems off the Vraylar.  The increase Seroquel in the past helped sleep and mood and only needed Lunesta 2-3 times since here.  Work function is typical.  Remains stressful job.  Expects that and handling it a little better.  Better self confidence.   Wife also started working at [employer] too.   Plan: no med changes  08/27/20 appt with following noted: Episode early July after out of lamotrigine for several weeks.  Got in huge fight with wife and missed a week of work.  Otherwise is OK since then.   Out of Armodafinil now for 4-6 weeks.  Managing with mild sleepiness.  Sleep is OK usually with prn. Minimal tremor. Patient reports stable mood and denies depressed or irritable moods usually.  Patient has a little more recent difficulty with anxiety.   Denies appetite disturbance.  Patient reports that energy and motivation have been good.  Patient denies any difficulty with concentration.  Patient denies any suicidal ideation. Plan: no med changes.  Check lithium level.  01/07/21 appt noted: DC from hospital [facility] on Friday 01/05/20.  During week drinks only a couple and more on  weekend.  Plans to drink only weeknds now. Admitted 12/26/20 with emotions of anger and sorry and regret over prior decisions. What put him over was Ex and he has no say over his daughter's care.  Cut his wrists in [place].  Wife caught him in the process.   Gabapentin 300 added for anxiety.  Not sure if med changes helped.  No fatigue. SE more tremor.  Anxiety still present. Don't have sorrow and anger he had before.  I feel better without SI. Slept OK weekend overall except anxiety about the situation.  Taking Lunesta nightly.  ? Re FMLA. Plan: Increase gabapentin 600 mg TID for anxiety and tremor.  01/18/21 appt: Better off gabapentin DT sexual SE about 3 days ago.  Didn't help anxiety or tremor. Still some anxiety and  job stress but handled it OK.   Missed no sig days since here.  Work function not good the first week back. Right now not depressed and not suicidal.  No weapons in the house.  Last SI in hospital. Stress from child's mother and bills can cause him to spiral somewhat.   Taking Lunesta consistently.  Taking Xanax TID and no SE. Plan no med changes  02/13/2021 appt noted: Even keel .  No SI.  Better than last time.  Learned things to help him not get back in the way he was in [place].  Problems with lamotrigine refill. Missed 5-7 days of lamotrigine.  Learned from classes at the hospital.   RTW 2.22.22.  They made a lot of changes there so it's a challenge with fast pace.  Doing well so far.  Louann Sjogren says he's doing OK. Sleep good with Lunesta Seroquel.   Plan: Restart lamotrigine 1/2 of 200 mg tablet for 5 days, then 1 tablet daily for 5 days, then 1 and 1/2 tablets daily for 5 days then 1 twice daily.  10/2021 patient's wife called concerned because he was cutting himself.  Was not a suicide attempt. 04/01/2021 patient evaluated in the ER.  And diagnosed with vertigo  05/17/2021 appointment with the following noted: Vertigo lasted about 2 mos off and on.   Says wife got info about cutting  were gone.  He had scratched himself.  He was not suicidal and did not cut himself.  He is not cutting. Lately overall more stable but is under a bit of stress and is anxious.  Seems like the Xanax is not working as well.  Wonders about switching BZ. Increased work demands.   Sometimes some manic sx with increased talking and hyperactivity including last week.  Depression managed.   Sleeep is good.  Impulsivity managed bc debt.   Plan: Get lithium level today. Continue Seroquel 450mg  nightly to improve his sleep which should also help with mood stability.  Had mania at lower dose and sedation at higher dose.  Polypharmacy in hopes lithium alone will be sufficient at preventing mania. Continue FMLA to have leave for episodic flares of condition intermittent estimated 1-5 days, frequency approximately every 2 mos.  This satisfied his work. Continue lamotrigine 200 mg BID OK trial clonazepam 0.5 mg TID  07/10/2021 phone call: Next visit is 09/16/21. [place] called and said that the Seroquel and Lithium he has been taking is causing weight gain and is making his hands swollen. He would like to discuss  the side effects and what he can do to avoid these from happening.  He was told that these are major and important mood stabilizing medications for him and we cannot make these changes without an appointment.  09/16/2021 appointment with the following noted: Takes clonazepam 0.5 and 1 mg rarely and not a lot of effectiveness so still using Xanax 0.5 mg but takes 3-4 tablets twice daily if having bad day of anxiety.  Denies SE.  Takes Xanax about once daily routinely. Average 2-4 daily. Taking lamotrigine 200 mg BID, Seroquel 450.  Ran out past few days. Doesn't sleep well without meds. Problems with child's mother and huge disagreements over caretaking and child support. Really stressful job, over scrutinized so lives anxiety 7-8/10 and better after work. Concerned about meds and weight gain. Hungry and  thinks it's Seroquel. Plan:  RE:  Seroquel 450mg  nightly Had mania at lower dose and sedation at higher dose. Start Latuda 30 mg for  1 week then 60 mg daily. Continue FMLA to have leave for episodic flares of condition intermittent estimated 1-5 days, frequency approximately every 2 mos.  This satisfied his work. Continue lamotrigine 200 mg BID trial propranolol 40-60 mg BID prn anxiety.  OK use with Xanax.  Need try to reduce Xanax dose.  Follow-up 6 weeks  10/16/2021 phone call this patient was content fused about how he was supposed to be taking Latuda and Seroquel.  He was taking it appropriately at quetiapine 450 mg nightly and Latuda 60 mg p.m.  With a goal of weaning the Seroquel he was encouraged to reduce that to 300 mg nightly.  11/27/2021 phone call complaining of difficulty sleeping with the lower dose of Seroquel and wanting to increase it back to 450 mg nightly.  This is an increase was agreed.  12/13/2021 phone call: Complaining of poor sleep and more mania. He had not increased the Seroquel to 450 mg daily and was taking Latuda 60 mg and wanted to stop it.  12/30/2021 appointment the following noted:  Didn't like Latuda bc felt he was more manic on it with more spending and credit card debt.   Off the Latuda about 3 weeks. Rather deal with the weight than have unstable mood. Mood is more stable back on Seroquel 450 mg nightly with the lithium 900 mg nightly.  He is not manic currently.  He is needing the Lunesta to sleep.  Admits to taking 2 at a time on occasion. No longer drinks too much. He sees benefit.  [name] says he's well.   Plan: Continue Lithium 900 mg daily. continue  Seroquel 450mg  nightly Had mania at lower dose and sedation at higher dose. For antipsychotic related weight gain we will add metformin 500 mg daily for 1 week then 500 mg twice daily if tolerated since the attempt to switch from Seroquel to Latuda failed.  02/26/22 appt noted: Asks for Lunesta bc  often needs something to help him go to sleep.   Mood is better.  Handled stressors.  Patient reports stable mood and denies depressed or irritable moods.  Patient denies any recent difficulty with anxiety.  Patient denies difficulty with sleep initiation or maintenance. Denies appetite disturbance.  Patient reports that energy and motivation have been good.  Patient denies any difficulty with concentration.  Patient denies any suicidal ideation. Wife OK after pacemaker replacmement. No benefit with metformin 500 BID for weight and willing to increase. No othert SE  12/03/22 appt noted: Meds: Seroquel 450 HS, lithium 900 mg HS, lamotrigine 200 mg   , metformin 500 mg BID (SE diarrhea 1000 mg BID) Uses Xanax 0.5 mg prn anxiety. No SE.  Not using Lunesta BC $25/RX Thinks he went through manic stage with spending and credit cards in April and [place] in September.  He doesn't recognize mania at the time but looking back on it sees it.  Did chapter 51. Mood is OK at present and relieved bc not getting creditor calls. Usual seasonal depression is OK> No SE with meds. He says wife thinks he argues too much.  No excess spending and ok function at work.   Sleep ok with meds. Plan: trial propranolol for anxiety  03/26/23 appt noted: Meds: Seroquel 450 HS, lithium 1200 mg HS, lamotrigine 200 mg .   Uses Xanax 0.5 mg prn anxiety and tremro. No SE.  Lunesta 3mg  HS Alprazolam almost daily before work and at bedtime.    Doesn't feel like it helps as  much as in the past..  it also helps the tremor.  No sleepiness or SE.     Meds as above. Has a very stressful day.  Can have anxiety other times but alprazolam is helpful for tremor and propranolol didn't help tremor.  Increased lithium to 1200  wife said he's more level headed.  Probably tremor is some worse after the increase lithium.  Embarrassing.   To me I always struggle with dep but month Feb is worse with anniversary of mother's death 14-Apr-2019.  It still  hurts.   Chronic struggles with money.  Been through bankruptcy before but didn't cover everything. SI resolved after psych hosp. Sleep good with combo Lunesta and alprazolam.  Can't sleep without it.   Feels ok with work function.  More reasonable demands at work lately.  Past Psychiatric Medication Trials:  Depakote paranoia, topiramate, Seroquel 800 sedating, lamotrigine, Abilify 20 ,  Vraylar 3 mg restless and couldn't sleep,  Latuda 60 mg with Seroquel 300 mg mania lithium 1200 side effect tremor  Gabapentin NR and SE Nuvigil,  Wellbutrin, Viibryd,  trazodone 400,  Sonata, amitriptyline, Ambien, Lunesta worked  propranolol 40-60 mg BID prn anxiety NR Propranolol NR for anxiety.  Metformin 1000 BID diarrhea no benefit for wt    Review of Systems:  Review of Systems  Constitutional:  Positive for unexpected weight change. Negative for fatigue.  Eyes:  Positive for pain.  Cardiovascular:  Negative for palpitations.       Hypertension  Gastrointestinal:  Negative for nausea.  Neurological:  Positive for tremors. Negative for dizziness and weakness.  Psychiatric/Behavioral:  Negative for agitation, behavioral problems, confusion, decreased concentration, dysphoric mood, hallucinations, self-injury, sleep disturbance and suicidal ideas. The patient is nervous/anxious. The patient is not hyperactive.     Medications: I have reviewed the patient's current medications.  Current Outpatient Medications  Medication Sig Dispense Refill   ALPRAZolam (XANAX) 0.5 MG tablet TAKE 1 TABLET THREE TIMES A DAY AS NEEDED FOR ANXIETY 90 tablet 1   amLODipine (NORVASC) 10 MG tablet Take 10 mg by mouth daily.     atorvastatin (LIPITOR) 40 MG tablet Take 40 mg by mouth daily.     Eszopiclone 3 MG TABS TAKE 1 TABLET IMMEDIATELY BEFORE BEDTIME 90 tablet 0   ibuprofen (ADVIL,MOTRIN) 400 MG tablet Take 1 tablet (400 mg total) by mouth every 6 (six) hours as needed for moderate pain. 12 tablet 0    lamoTRIgine (LAMICTAL) 200 MG tablet Take 1 tablet (200 mg total) by mouth 2 (two) times daily. 180 tablet 1   lithium carbonate 300 MG capsule Take 4 capsules (1,200 mg total) by mouth daily. 360 capsule 0   primidone (MYSOLINE) 50 MG tablet 1 at night for 3 nights then 1 twice daily for 3 days then 1 in the AM and 2 at night for 3 days then 2 twice daily for tremor 120 tablet 0   QUEtiapine (SEROQUEL) 300 MG tablet TAKE ONE AND ONE-HALF TABLETS AT BEDTIME 135 tablet 0   sildenafil (VIAGRA) 100 MG tablet Take by mouth.     valACYclovir (VALTREX) 500 MG tablet Take 500 mg by mouth. Takes for 5 days     No current facility-administered medications for this visit.    Medication Side Effects: None sexual SE are better  Allergies: No Known Allergies  Past Medical History:  Diagnosis Date   Anxiety disorder    Bipolar 1 disorder (HCC)    Hyperlipidemia    Hypertension  History reviewed. No pertinent family history.  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  Tobacco Use   Smoking status: Never   Smokeless tobacco: Never  Substance and Sexual Activity   Alcohol use: Yes   Drug use: No   Sexual activity: Not on file  Other Topics Concern   Not on file  Social History Narrative   Not on file   Social Determinants of Health   Financial Resource Strain: Not on file  Food Insecurity: Not on file  Transportation Needs: Not on file  Physical Activity: Not on file  Stress: Not on file  Social Connections: Not on file  Intimate Partner Violence: Not on file    Past Medical History, Surgical history, Social history, and Family history were reviewed and updated as appropriate.   Please see review of systems for further details  on the patient's review from today.   Objective:   Physical Exam:  There were no vitals taken for this visit.  Physical Exam Neurological:     Mental Status: He is alert and oriented to person, place, and time.     Cranial Nerves: No dysarthria.  Psychiatric:        Attention and Perception: Attention and perception normal.        Mood and Affect: Mood is anxious. Mood is not depressed. Affect is not labile or tearful.        Speech: Speech normal. Speech is not rapid and pressured, slurred or tangential.        Behavior: Behavior is not slowed. Behavior is cooperative.        Thought Content: Thought content normal. Thought content is not paranoid or delusional. Thought content does not include homicidal or suicidal ideation. Thought content does not include suicidal plan.        Cognition and Memory: Cognition and memory normal.        Judgment: Judgment normal.     Comments: Insight fair to good Denies mania now     Lab Review:     Component Value Date/Time   NA 140 10/26/2020 0833   K 4.6 10/26/2020 0833   CL 104 10/26/2020 0833   CO2 23 10/26/2020 0833   GLUCOSE 104 (H) 10/26/2020 0833   GLUCOSE 107 (H) 10/06/2019 1534   BUN 13 10/26/2020 0833   CREATININE 1.39 (H) 10/26/2020 0833   CALCIUM 9.8 10/26/2020 0833   PROT 8.7 (H) 07/20/2015 1129   ALBUMIN 4.9 07/20/2015 1129   AST 37 07/20/2015 1129   ALT 51 07/20/2015 1129   ALKPHOS 79 07/20/2015 1129   BILITOT 0.6 07/20/2015 1129   GFRNONAA 59 (L) 10/26/2020 0833   GFRAA 68 10/26/2020 0833       Component Value Date/Time   WBC 3.4 (L) 09/15/2016 0500   RBC 4.37 (L) 09/15/2016 0500   HGB 13.3 09/15/2016 0500   HCT 38.7 (L) 09/15/2016 0500   PLT 242 09/15/2016 0500   MCV 88.6 09/15/2016 0500   MCH 30.4 09/15/2016 0500   MCHC 34.3 09/15/2016 0500   RDW 13.1 09/15/2016 0500   LYMPHSABS 2.3 07/20/2015 1129   MONOABS 0.2 07/20/2015 1129   EOSABS 0.1 07/20/2015 1129   BASOSABS 0.0 07/20/2015  1129    Lithium  Lvl  Date Value Ref Range Status  06/06/2021 0.5 0.5 - 1.2 mmol/L Final    Comment:    Plasma concentration of 0.5 - 0.8 mmol/L are advised for long-term use; concentrations of up to 1.2 mmol/L may be necessary during acute treatment.                                  Detection Limit = 0.1                           <0.1 indicates None Detected      06/17/22 Cr 1.5, TSH 1.85, lipids done by PCP   No results found for: "PHENYTOIN", "PHENOBARB", "VALPROATE", "CBMZ"   .res Assessment: Plan:    Bipolar I disorder, most recent episode (or current) manic (HCC) - Plan: Lithium level, Basic metabolic panel  Generalized anxiety disorder - Plan: primidone (MYSOLINE) 50 MG tablet  Drug-induced weight gain  Insomnia due to mental condition  Lithium-induced tremor - Plan: primidone (MYSOLINE) 50 MG tablet  Obstructive sleep apnea  Weight gain due to medication  Lithium use - Plan: Lithium level, Basic metabolic panel   Greater than 09% of 30 min video face to face time with patient was spent on counseling and coordination of care. We discussed his diagnoses and answered questions re: bipolar dx, meds, SE. he had received additional parent benefit and mood consistency with the addition of the lithium.   But DT mania in 2023 he's increased lithium 1200 mg daily with benefit but more tremor..  Some chronic problems with not making appointments and following up as recommended as compromised the effectiveness of his treatment.  This was discussed.  Emphasized importance of consistency.  This is a problem and puts risk mania and cycling emphasized.  Discussed potential metabolic side effects associated with atypical antipsychotics, as well as potential risk for movement side effects. Advised pt to contact office if movement side effects occur.   We discussed the short-term risks associated with benzodiazepines including sedation and increased fall risk among others.  Discussed long-term side  effect risk including dependence, potential withdrawal symptoms, and the potential eventual dose-related risk of dementia.  But recent studies from 2020 dispute this association between benzodiazepines and dementia risk. Newer studies in 2020 do not support an association with dementia.  But did discuss risk of affecting memory in daytime.  Counseled patient regarding potential benefits, risks, and side effects of Lamictal to include potential risk of Stevens-Johnson syndrome. Advised patient to stop taking Lamictal and contact office immediately if rash develops and to seek urgent medical attention if rash is severe and/or spreading quickly.  Also emphasized the need for conisistency with lamotrigine dosing otherwise there is an increased risk of rash.  Disc potential effect on B vitamin levels.   Disc increased rash risk with inconsistency. Disc risk DDI with primidone and if gets more dep or anxious call and will need to increase lamotrigine.  Watch alcohol.  Disc with him.  Disc alcohol and dehydration and risk lithium toxicity .  Counseled patient regarding potential benefits, risks, and side effects of lithium to include potential risk of lithium affecting thyroid and renal function.  Discussed need for periodic lab monitoring to determine drug level and to assess for potential adverse effects.  Counseled patient regarding signs and symptoms of lithium toxicity and advised that they  notify office immediately or seek urgent medical attention if experiencing these signs and symptoms.  Patient advised to contact office with any questions or concerns. Has some CR creep. Will repeat BMP  06/06/21 lithium level 0.5.  Continue Lithium 1200 mg daily bc mania spring 2023 and he is more level with it  Need another level and BMP  emphasized.    Option B6 500 mg BID for lithium tremor.  Option primidone he prefers to try it.   Propranolol 60 mg failed.  continue  Seroquel 450mg  nightly Had mania at lower  dose and sedation at higher dose.  Continue FMLA to have leave for episodic flares of condition intermittent estimated 1-5 days, frequency approximately every 2 mos.  This satisfied his work and needs to be renewed.    Continue lamotrigine 200 mg BID  OK use with Xanax.    He can take Lunesta 3 mg nightly but he cannot increase the dose above that.  Disc the off-label use of N-Acetylcysteine at 1200 mg daily to help with mild cognitive problems.  It can be combined with a B-complex vitamin as the B-12 and folate have been shown to sometimes enhance the effect. This is for complaints of forgetfulness.  Discussed the risks and in his case necessity of polypharmacy.  He is agreeable to continue the current meds.  FU 12 weeks  Meredith Staggers, MD, DFAPA   Please see After Visit Summary for patient specific instructions.  No future appointments.    Orders Placed This Encounter  Procedures   Lithium level   Basic metabolic panel        -------------------------------

## 2023-04-09 ENCOUNTER — Other Ambulatory Visit: Payer: Self-pay | Admitting: Psychiatry

## 2023-04-09 DIAGNOSIS — G251 Drug-induced tremor: Secondary | ICD-10-CM

## 2023-04-09 DIAGNOSIS — F411 Generalized anxiety disorder: Secondary | ICD-10-CM

## 2023-04-16 ENCOUNTER — Other Ambulatory Visit: Payer: Self-pay | Admitting: Psychiatry

## 2023-04-16 DIAGNOSIS — F3110 Bipolar disorder, current episode manic without psychotic features, unspecified: Secondary | ICD-10-CM

## 2023-04-16 DIAGNOSIS — Z79899 Other long term (current) drug therapy: Secondary | ICD-10-CM

## 2023-04-19 ENCOUNTER — Other Ambulatory Visit: Payer: Self-pay

## 2023-04-19 DIAGNOSIS — F5105 Insomnia due to other mental disorder: Secondary | ICD-10-CM

## 2023-04-20 ENCOUNTER — Telehealth: Payer: Self-pay | Admitting: Psychiatry

## 2023-04-20 ENCOUNTER — Other Ambulatory Visit: Payer: Self-pay

## 2023-04-20 DIAGNOSIS — Z79899 Other long term (current) drug therapy: Secondary | ICD-10-CM

## 2023-04-20 DIAGNOSIS — F3110 Bipolar disorder, current episode manic without psychotic features, unspecified: Secondary | ICD-10-CM

## 2023-04-20 DIAGNOSIS — F411 Generalized anxiety disorder: Secondary | ICD-10-CM

## 2023-04-20 MED ORDER — ALPRAZOLAM 0.5 MG PO TABS
ORAL_TABLET | ORAL | 0 refills | Status: DC
Start: 2023-04-20 — End: 2023-06-17

## 2023-04-20 MED ORDER — ESZOPICLONE 3 MG PO TABS: ORAL_TABLET | ORAL | 0 refills | Status: DC

## 2023-04-20 NOTE — Telephone Encounter (Signed)
Patient said he has enough lithium, just needed the alprazolam. Reported the pramipexole wasn't working, but has only taken for 4 days, said he would give it some more time.

## 2023-04-20 NOTE — Telephone Encounter (Signed)
LVM to [facility] 

## 2023-04-20 NOTE — Telephone Encounter (Signed)
Pt LVM@ 8:04a.  He said last Tuesday he fell down.  He had dizziness and vertigo.  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  It got worse and he went to the ER on Sat but they couldn't find anything.  He thought he was taking his meds correctly but then said he thinks it's the psych meds that might be the problem.  He said he is out of Xanax and would like Dr Jennelle Human to call in 5 pills of Xanax.  He also states that he has been without Lithium.  He is asking for 8 pills of that.  He said he has ordered the Lithium from [facility] a week ago, but hasn't gotten it.  He would like the Xanax and Lithium sent to [facility] [place].  He also stated that the Primidone is not helping, but maybe it needs more time.  Next appt 7/31

## 2023-05-04 ENCOUNTER — Telehealth: Payer: Self-pay | Admitting: Psychiatry

## 2023-05-04 NOTE — Telephone Encounter (Signed)
Pt Lvm @ 11:02a.  He said the tremor medicine Dr Jennelle Human started him on worked for one day.  He was able to play [place] with his wife.  But after that it, he's having the same issue and it's also "effecting his libido".  Is there another drug he can try.  Next appt

## 2023-05-04 NOTE — Telephone Encounter (Signed)
LVM to [facility] 

## 2023-05-05 NOTE — Telephone Encounter (Signed)
Mailbox is full.

## 2023-05-07 NOTE — Telephone Encounter (Signed)
Tried calling again today and his mailbox is full. Sent MyChart message asking him to call.

## 2023-05-08 ENCOUNTER — Other Ambulatory Visit: Payer: Self-pay | Admitting: Psychiatry

## 2023-05-08 DIAGNOSIS — G251 Drug-induced tremor: Secondary | ICD-10-CM

## 2023-05-08 DIAGNOSIS — F411 Generalized anxiety disorder: Secondary | ICD-10-CM

## 2023-05-13 ENCOUNTER — Telehealth: Payer: Self-pay

## 2023-05-13 NOTE — Telephone Encounter (Signed)
Pt called at 3:43p.  He said he wonders if the lithium might be the problem.  He wonders if Dr. Jennelle Human thinks it needs to be reduced. He has never had tremors to the point he can't type.      Next appt 7/31

## 2023-05-13 NOTE — Telephone Encounter (Signed)
Patient was started on primidone at 5/9 appt. He reports it worked one time. He said if he takes with Xanax it works well, but the reason for the primidone was so that he did not become dependent on the Xanax. He is also reporting decreased libido. He is asking if dose of primidone can be increased to address tremor, or another medication w/o SSE.   Prescribed:  1 at night for 3 nights then 1 twice daily for 3 days then 1 in the AM and 2 at night for 3 days then 2 twice daily for tremor

## 2023-05-13 NOTE — Telephone Encounter (Signed)
The last lithium level that he had was on April 18, 2023 and the level was nondetectable.  Apparently that is a lab error if he has been taking the medicine.  So I cannot use his last lithium level to gauge what to do.  I need him to repeat the lithium level.  Be sure he goes in the morning and do not take any lithium in the morning before the blood test. If his tremors are that bad then I do suspect a high lithium level but the last lab result did not reflect an accurate lithium level

## 2023-05-14 NOTE — Telephone Encounter (Signed)
LVM that MyChart message had been sent with Dr. Alwyn Ren response.

## 2023-05-16 LAB — BASIC METABOLIC PANEL
BUN/Creatinine Ratio: 4 — ABNORMAL LOW (ref 9–20)
BUN: 6 mg/dL (ref 6–24)
CO2: 24 mmol/L (ref 20–29)
Calcium: 9.9 mg/dL (ref 8.7–10.2)
Chloride: 102 mmol/L (ref 96–106)
Creatinine, Ser: 1.34 mg/dL — ABNORMAL HIGH (ref 0.76–1.27)
Glucose: 93 mg/dL (ref 70–99)
Potassium: 4 mmol/L (ref 3.5–5.2)
Sodium: 140 mmol/L (ref 134–144)
eGFR: 64 mL/min/{1.73_m2} (ref >59)

## 2023-05-16 LAB — LITHIUM LEVEL: Lithium Lvl: 0.4 mmol/L — ABNORMAL LOW (ref 0.5–1.2)

## 2023-05-18 ENCOUNTER — Telehealth: Payer: Self-pay

## 2023-05-18 NOTE — Telephone Encounter (Signed)
2 issues:  1: temor:  His lithium level was low in ER visit and low now and seems unlikely to cause tremor.  However the only way to know for sure is to wean it.  He may have more mood swings wthout it.   Reduce lithium by 1 capsule weekly.   Call if mood sx get worse.  If tremor is not better coming off it after a week , then it's not the cause 2. Intolerance primidone:  reduce by 1 tablet every 3 days until off of it.  Call if anything gets worse. Meredith Staggers, MD, DFAPA

## 2023-05-18 NOTE — Telephone Encounter (Signed)
Labs from 6/28: Patient's lithium level was 0.4 Serum CR 1.34 BUN/CR ratio 4   Patient said he cannot tolerate the primidone due to SSE.  He describes ED. I see that he was on sildenafil in 2020 from another provider.

## 2023-05-18 NOTE — Telephone Encounter (Signed)
Sent MyChart message with info.  We discussed last week that he is difficult to get in touch with and that I would send Dr. Alwyn Ren response via MyChart.

## 2023-05-26 ENCOUNTER — Other Ambulatory Visit: Payer: Self-pay | Admitting: Psychiatry

## 2023-05-26 DIAGNOSIS — F411 Generalized anxiety disorder: Secondary | ICD-10-CM

## 2023-05-26 DIAGNOSIS — G251 Drug-induced tremor: Secondary | ICD-10-CM

## 2023-05-27 ENCOUNTER — Telehealth: Payer: Self-pay | Admitting: Psychiatry

## 2023-05-27 NOTE — Telephone Encounter (Signed)
Sent MyChart message with recommendations.

## 2023-05-27 NOTE — Telephone Encounter (Signed)
Please see message from patient regarding sx since coming off the lithium.  Response to patient on 7/1. 2 issues:  1: Tremor:  His lithium level was low in ER visit and low now and seems unlikely to cause tremor.  However the only way to know for sure is to wean it.  He may have more mood swings wthout it.   Reduce lithium by 1 capsule weekly.   Call if mood symptoms get worse.  If tremor is not better coming off it after a week, then it's not the cause 2. Intolerance to primidone:  Reduce by 1 tablet every 3 days until off of it.  Prior to this reported SSE with primidone. He is difficult to get in touch with and I haven't talked with him. He has an appt 7/31.

## 2023-05-27 NOTE — Telephone Encounter (Signed)
He has an appt in 2 weeks.  There is no obvious alternative to lithium but a variety of things we can try.  Too complicated to decide outside of appt.  Simplest thing to help short term is to restart lihtium at half dose of 600 mg daily.  That won't cause as much tremor but should help him feel better quicker than anything else.   The only other easy option is to double the dose of  Seroquel.

## 2023-05-27 NOTE — Telephone Encounter (Signed)
Pt left message stating he stopped taking Lithium. His tremors have stopped. Having mood swings and crying. Requesting something to help with these symptoms. Stated don't want to change Seroquel works well for sleep. Contact # [phone]. Apt 7/31

## 2023-06-17 ENCOUNTER — Encounter: Payer: Self-pay | Admitting: Psychiatry

## 2023-06-17 ENCOUNTER — Telehealth (INDEPENDENT_AMBULATORY_CARE_PROVIDER_SITE_OTHER): Payer: 59 | Admitting: Psychiatry

## 2023-06-17 DIAGNOSIS — G4733 Obstructive sleep apnea (adult) (pediatric): Secondary | ICD-10-CM

## 2023-06-17 DIAGNOSIS — R635 Abnormal weight gain: Secondary | ICD-10-CM

## 2023-06-17 DIAGNOSIS — T50905A Adverse effect of unspecified drugs, medicaments and biological substances, initial encounter: Secondary | ICD-10-CM

## 2023-06-17 DIAGNOSIS — Z79899 Other long term (current) drug therapy: Secondary | ICD-10-CM

## 2023-06-17 DIAGNOSIS — F5105 Insomnia due to other mental disorder: Secondary | ICD-10-CM

## 2023-06-17 DIAGNOSIS — F411 Generalized anxiety disorder: Secondary | ICD-10-CM | POA: Diagnosis not present

## 2023-06-17 DIAGNOSIS — F3110 Bipolar disorder, current episode manic without psychotic features, unspecified: Secondary | ICD-10-CM | POA: Diagnosis not present

## 2023-06-17 DIAGNOSIS — G251 Drug-induced tremor: Secondary | ICD-10-CM

## 2023-06-17 MED ORDER — ESZOPICLONE 3 MG PO TABS: ORAL_TABLET | ORAL | 0 refills | Status: DC

## 2023-06-17 MED ORDER — LUMATEPERONE TOSYLATE 42 MG PO CAPS
42.0000 mg | ORAL_CAPSULE | Freq: Every day | ORAL | 1 refills | Status: DC
Start: 2023-06-17 — End: 2023-08-02

## 2023-06-17 MED ORDER — LAMOTRIGINE 200 MG PO TABS
200.0000 mg | ORAL_TABLET | Freq: Two times a day (BID) | ORAL | 1 refills | Status: DC
Start: 2023-06-17 — End: 2023-10-20

## 2023-06-17 MED ORDER — ALPRAZOLAM 0.5 MG PO TABS
0.5000 mg | ORAL_TABLET | Freq: Three times a day (TID) | ORAL | 1 refills | Status: DC | PRN
Start: 2023-06-17 — End: 2023-10-13

## 2023-06-17 NOTE — Progress Notes (Signed)
Ritvik Mclean [id number] 03/20/1971 52 y.o.  Video Visit via My Chart  I connected with pt by My Chart video and verified that I am speaking with the correct person using two identifiers.   I discussed the limitations, risks, security and privacy concerns of performing an evaluation and management service by My Chart  and the availability of in person appointments. I also discussed with the patient that there may be a patient responsible charge related to this service. The patient expressed understanding and agreed to proceed.  I discussed the assessment and treatment plan with the patient. The patient was provided an opportunity to ask questions and all were answered. The patient agreed with the plan and demonstrated an understanding of the instructions.   The patient was advised to call back or seek an in-person evaluation if the symptoms worsen or if the condition fails to improve as anticipated.  I provided 30 minutes of video time during this encounter.  The patient was located at home and the provider was located office.  Session started at [phone]  Subjective:   Patient ID:  Michael Mclean is a 52 y.o. (DOB 1971/04/18) male.  Chief Complaint:  Chief Complaint  Patient presents with   Follow-up   Depression   Anxiety   Manic Behavior   Sleeping Problem    Medication Refill Pertinent negatives include no fatigue, nausea or weakness.  Anxiety Symptoms include nervous/anxious behavior. Patient reports no confusion, decreased concentration, dizziness, nausea, palpitations or suicidal ideas.    Depression        Associated symptoms include no decreased concentration, no fatigue and no suicidal ideas.  Past medical history includes anxiety.    Michael Mclean is due for follow-up of bipolar disorder.  seen August 11, 2019.  Due to erectile dysfunction Seroquel XR was reduced to 600 mg daily from 800 mg daily.  Also lithium level and labs were ordered.  seen October 06, 2019.  Serum lithium level, TSH, BMP.  Check it.  He wants to go now this afternoon.  It will not be a trough but it is hard for him to get off of work.  We will expect a lower than usual blood level. Start Vraylar 1.5 mg 1 capsule daily Reduce Seroquel XR to 1 1/2 nightly for 1 week then 1 nightly  seen November 17, 2019.  The following changes were noted: Reduce Vraylar 1.5 mg to every other daily DT restlessness change Seroquel 300mg   1 nightly to improve his sleep which should also help with mood stability.  He called January 15 complaining of insomnia.  Had taken up to 6 Lunesta and 1 night!  He was informed this was dangerous and not to do so again.  He was instructed to increase the quetiapine by an extra half tablet and to increase the Vraylar to 3 mg daily.  It was felt he was having some manic symptoms.  01/26/2020 appointment with the following noted: Admits he had manic sx in January.  Resolved with med changes.  Had to increase the Seroquel to control mania.   Mania had affected work and insomnia.  Tried Vraylar in hopes of being able to reduce eliminate Seroquel.  Was missing work, but not now.  But having problem with FMLA with new supervisor.  Says the episodes have been up to 5 days but the FMLA says leaves would be anticipated for 7-14 days.    Usual Xanax is 2 tablets every 3 days for anxiety. Pretty good.  Job is still really stressful and only real concern. .  Lasts 3 mos.  Job is very stressful. And volume demands.  Lives in [place]. Married June 2020 and right after fell apart for 3-5 days.  Lost control for a little but normal again now.   Seroquel and lithium work well for sleep and mood.  No problems with reduction in Seroquel in mood and the sexual SE are better.  Used Viagra twice. He reduced the lithium back to 900 mg bc felt like it was all that was necessary.  A little scared to increase the lithium bc hand jerks at times.  Some tremor.  Scares him.  Little anger  an impulsivity at this time but is a little.  Notices he can't drink much with the lithiium.  When drinks he does too much and may not sleep and then has a really negative effect on him. Lithium has helped manage with less anger and impulsivity.  Pleased with the response.  Did not have this level of control before.  Wants to try to get on less meds and his fiancee' is suggesting it.  Feels guilty about being dependent on the meds.  Plan: It was felt [name] was probably not having significant clinical effect and in order to reduce polypharmacy it was discontinued.  05/11/2020 appointment with the following noted: Taking Xanax prn.  Disc FMLA amendment helpful.  Tolerating meds.   Overall doing relatively well.  One period when couldn't work for 2-3 days DT anxiety and irritability.  Otherwise better with change lifestyle with marriage and better routine. No problems off the Vraylar.  The increase Seroquel in the past helped sleep and mood and only needed Lunesta 2-3 times since here.  Work function is typical.  Remains stressful job.  Expects that and handling it a little better.  Better self confidence.   Wife also started working at [employer] too.   Plan: no med changes  08/27/20 appt with following noted: Episode early July after out of lamotrigine for several weeks.  Got in huge fight with wife and missed a week of work.  Otherwise is OK since then.   Out of Armodafinil now for 4-6 weeks.  Managing with mild sleepiness.  Sleep is OK usually with prn. Minimal tremor. Patient reports stable mood and denies depressed or irritable moods usually.  Patient has a little more recent difficulty with anxiety.   Denies appetite disturbance.  Patient reports that energy and motivation have been good.  Patient denies any difficulty with concentration.  Patient denies any suicidal ideation. Plan: no med changes.  Check lithium level.  01/07/21 appt noted: DC from hospital [facility] on Friday 01/05/20.   During week drinks only a couple and more on weekend.  Plans to drink only weeknds now. Admitted 12/26/20 with emotions of anger and sorry and regret over prior decisions. What put him over was Ex and he has no say over his daughter's care.  Cut his wrists in [place].  Wife caught him in the process.   Gabapentin 300 added for anxiety.  Not sure if med changes helped.  No fatigue. SE more tremor.  Anxiety still present. Don't have sorrow and anger he had before.  I feel better without SI. Slept OK weekend overall except anxiety about the situation.  Taking Lunesta nightly.  ? Re FMLA. Plan: Increase gabapentin 600 mg TID for anxiety and tremor.  01/18/21 appt: Better off gabapentin DT sexual SE about 3 days  ago.  Didn't help anxiety or tremor. Still some anxiety and job stress but handled it OK.   Missed no sig days since here.  Work function not good the first week back. Right now not depressed and not suicidal.  No weapons in the house.  Last SI in hospital. Stress from child's mother and bills can cause him to spiral somewhat.   Taking Lunesta consistently.  Taking Xanax TID and no SE. Plan no med changes  02/13/2021 appt noted: Even keel .  No SI.  Better than last time.  Learned things to help him not get back in the way he was in [place].  Problems with lamotrigine refill. Missed 5-7 days of lamotrigine.  Learned from classes at the hospital.   RTW 2.22.22.  They made a lot of changes there so it's a challenge with fast pace.  Doing well so far.  Michael Mclean says he's doing OK. Sleep good with Lunesta Seroquel.   Plan: Restart lamotrigine 1/2 of 200 mg tablet for 5 days, then 1 tablet daily for 5 days, then 1 and 1/2 tablets daily for 5 days then 1 twice daily.  10/2021 patient's wife called concerned because he was cutting himself.  Was not a suicide attempt. 04/01/2021 patient evaluated in the ER.  And diagnosed with vertigo  05/17/2021 appointment with the following noted: Vertigo lasted about 2 mos  off and on.   Says wife got info about cutting were gone.  He had scratched himself.  He was not suicidal and did not cut himself.  He is not cutting. Lately overall more stable but is under a bit of stress and is anxious.  Seems like the Xanax is not working as well.  Wonders about switching BZ. Increased work demands.   Sometimes some manic sx with increased talking and hyperactivity including last week.  Depression managed.   Sleeep is good.  Impulsivity managed bc debt.   Plan: Get lithium level today. Continue Seroquel 450mg  nightly to improve his sleep which should also help with mood stability.  Had mania at lower dose and sedation at higher dose.  Polypharmacy in hopes lithium alone will be sufficient at preventing mania. Continue FMLA to have leave for episodic flares of condition intermittent estimated 1-5 days, frequency approximately every 2 mos.  This satisfied his work. Continue lamotrigine 200 mg BID OK trial clonazepam 0.5 mg TID  07/10/2021 phone call: Next visit is 09/16/21. Michael Mclean called and said that the Seroquel and Lithium he has been taking is causing weight gain and is making his hands swollen. He would like to discuss  the side effects and what he can do to avoid these from happening.  He was told that these are major and important mood stabilizing medications for him and we cannot make these changes without an appointment.  09/16/2021 appointment with the following noted: Takes clonazepam 0.5 and 1 mg rarely and not a lot of effectiveness so still using Xanax 0.5 mg but takes 3-4 tablets twice daily if having bad day of anxiety.  Denies SE.  Takes Xanax about once daily routinely. Average 2-4 daily. Taking lamotrigine 200 mg BID, Seroquel 450.  Ran out past few days. Doesn't sleep well without meds. Problems with child's mother and huge disagreements over caretaking and child support. Really stressful job, over scrutinized so lives anxiety 7-8/10 and better after  work. Concerned about meds and weight gain. Hungry and thinks it's Seroquel. Plan:  RE:  Seroquel 450mg  nightly Had mania at lower  dose and sedation at higher dose. Start Latuda 30 mg for 1 week then 60 mg daily. Continue FMLA to have leave for episodic flares of condition intermittent estimated 1-5 days, frequency approximately every 2 mos.  This satisfied his work. Continue lamotrigine 200 mg BID trial propranolol 40-60 mg BID prn anxiety.  OK use with Xanax.  Need try to reduce Xanax dose.  Follow-up 6 weeks  10/16/2021 phone call this patient was content fused about how he was supposed to be taking Latuda and Seroquel.  He was taking it appropriately at quetiapine 450 mg nightly and Latuda 60 mg p.m.  With a goal of weaning the Seroquel he was encouraged to reduce that to 300 mg nightly.  11/27/2021 phone call complaining of difficulty sleeping with the lower dose of Seroquel and wanting to increase it back to 450 mg nightly.  This is an increase was agreed.  12/13/2021 phone call: Complaining of poor sleep and more mania. He had not increased the Seroquel to 450 mg daily and was taking Latuda 60 mg and wanted to stop it.  12/30/2021 appointment the following noted:  Didn't like Latuda bc felt he was more manic on it with more spending and credit card debt.   Off the Latuda about 3 weeks. Rather deal with the weight than have unstable mood. Mood is more stable back on Seroquel 450 mg nightly with the lithium 900 mg nightly.  He is not manic currently.  He is needing the Lunesta to sleep.  Admits to taking 2 at a time on occasion. No longer drinks too much. He sees benefit.  Michael Mclean says he's well.   Plan: Continue Lithium 900 mg daily. continue  Seroquel 450mg  nightly Had mania at lower dose and sedation at higher dose. For antipsychotic related weight gain we will add metformin 500 mg daily for 1 week then 500 mg twice daily if tolerated since the attempt to switch from Seroquel to Latuda  failed.  02/26/22 appt noted: Asks for Lunesta bc often needs something to help him go to sleep.   Mood is better.  Handled stressors.  Patient reports stable mood and denies depressed or irritable moods.  Patient denies any recent difficulty with anxiety.  Patient denies difficulty with sleep initiation or maintenance. Denies appetite disturbance.  Patient reports that energy and motivation have been good.  Patient denies any difficulty with concentration.  Patient denies any suicidal ideation. Wife OK after pacemaker replacmement. No benefit with metformin 500 BID for weight and willing to increase. No othert SE  12/03/22 appt noted: Meds: Seroquel 450 HS, lithium 900 mg HS, lamotrigine 200 mg   , metformin 500 mg BID (SE diarrhea 1000 mg BID) Uses Xanax 0.5 mg prn anxiety. No SE.  Not using Lunesta BC $25/RX Thinks he went through manic stage with spending and credit cards in April and [place] in September.  He doesn't recognize mania at the time but looking back on it sees it.  Did chapter 6. Mood is OK at present and relieved bc not getting creditor calls. Usual seasonal depression is OK> No SE with meds. He says wife thinks he argues too much.  No excess spending and ok function at work.   Sleep ok with meds. Plan: trial propranolol for anxiety  03/26/23 appt noted: Meds: Seroquel 450 HS, lithium 1200 mg HS, lamotrigine 200 mg .   Uses Xanax 0.5 mg prn anxiety and tremro. No SE.  Lunesta 3mg  HS Alprazolam almost daily before work and  at bedtime.    Doesn't feel like it helps as much as in the past..  it also helps the tremor.  No sleepiness or SE.     Meds as above. Has a very stressful day.  Can have anxiety other times but alprazolam is helpful for tremor and propranolol didn't help tremor.  Increased lithium to 1200  wife said he's more level headed.  Probably tremor is some worse after the increase lithium.  Embarrassing.   To me I always struggle with dep but month Feb is worse  with anniversary of mother's death 06/22/2019.  It still hurts.   Chronic struggles with money.  Been through bankruptcy before but didn't cover everything. SI resolved after psych hosp. Sleep good with combo Lunesta and alprazolam.  Can't sleep without it.   Feels ok with work function.  More reasonable demands at work lately. Plan: Need another level and BMP  emphasized.    06/17/23 appt noted: Stopped lithium 04/18/23. Tremor got unbearable & jerks. Tremor is still there but really mild compared to before. More mood swings off the lithium Psych med: no lithium, quetiapine 450 HS, lamotrigine 200 BID, Lunesta 3 mg HS, Xanax 0.5 mg TID Wife thinks more mood swings off lithium and he notices sad and crying uncontrollably and getting up in middle of night writing at times.  Writing texts to wife in middle of the night. Prior mania with credit card debt and bankruptcy.   Past Psychiatric Medication Trials:  Depakote paranoia delusions of parasitosis, topiramate, lamotrigine, Seroquel 800 sedating,   Abilify 20 ,  Vraylar 3 mg restless and couldn't sleep,  Latuda 60 mg with Seroquel 300 mg mania lithium 1200 side effect tremor but helped  Gabapentin NR and SE Nuvigil,  Wellbutrin, Viibryd,  trazodone 400,  Sonata, amitriptyline, Ambien, Lunesta worked  propranolol 40-60 mg BID prn anxiety NR Propranolol NR for anxiety. Primidone minimal effect for tremor  Metformin 1000 BID diarrhea no benefit for wt    Review of Systems:  Review of Systems  Constitutional:  Positive for unexpected weight change. Negative for fatigue.  Eyes:  Positive for pain.  Cardiovascular:  Negative for palpitations.       Hypertension  Gastrointestinal:  Negative for nausea.  Neurological:  Positive for tremors. Negative for dizziness and weakness.  Psychiatric/Behavioral:  Negative for agitation, behavioral problems, confusion, decreased concentration, dysphoric mood, hallucinations, self-injury, sleep disturbance  and suicidal ideas. The patient is nervous/anxious. The patient is not hyperactive.     Medications: I have reviewed the patient's current medications.  Current Outpatient Medications  Medication Sig Dispense Refill   amLODipine (NORVASC) 10 MG tablet Take 10 mg by mouth daily.     atorvastatin (LIPITOR) 40 MG tablet Take 40 mg by mouth daily.     ibuprofen (ADVIL,MOTRIN) 400 MG tablet Take 1 tablet (400 mg total) by mouth every 6 (six) hours as needed for moderate pain. 12 tablet 0   lumateperone tosylate (CAPLYTA) 42 MG capsule Take 1 capsule (42 mg total) by mouth daily. 30 capsule 1   QUEtiapine (SEROQUEL) 300 MG tablet TAKE ONE AND ONE-HALF TABLETS AT BEDTIME 135 tablet 0   sildenafil (VIAGRA) 100 MG tablet Take by mouth.     valACYclovir (VALTREX) 500 MG tablet Take 500 mg by mouth. Takes for 5 days     ALPRAZolam (XANAX) 0.5 MG tablet Take 1 tablet (0.5 mg total) by mouth 3 (three) times daily as needed for anxiety. 90 tablet 1   Eszopiclone  3 MG TABS Take immediately before bedtime 90 tablet 0   lamoTRIgine (LAMICTAL) 200 MG tablet Take 1 tablet (200 mg total) by mouth 2 (two) times daily. 180 tablet 1   No current facility-administered medications for this visit.    Medication Side Effects: None sexual SE are better  Allergies: No Known Allergies  Past Medical History:  Diagnosis Date   Anxiety disorder    Bipolar 1 disorder (HCC)    Hyperlipidemia    Hypertension     History reviewed. No pertinent family history.  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  Tobacco Use   Smoking status: Never   Smokeless tobacco: Never  Substance and Sexual Activity   Alcohol use: Yes   Drug use: No   Sexual activity: Not on file   Other Topics Concern   Not on file  Social History Narrative   Not on file   Social Determinants of Health   Financial Resource Strain: Not on file  Food Insecurity: Not on file  Transportation Needs: Not on file  Physical Activity: Not on file  Stress: Not on file  Social Connections: Not on file  Intimate Partner Violence: Not on file    Past Medical History, Surgical history, Social history, and Family history were reviewed and updated as appropriate.   Please see review of systems for further details on the patient's review from today.   Objective:   Physical Exam:  There were no vitals taken for this visit.  Physical Exam Neurological:     Mental Status: He is alert and oriented to person, place, and time.     Cranial Nerves: No dysarthria.  Psychiatric:        Attention and Perception: Attention and perception normal.        Mood and Affect: Mood is anxious and depressed. Affect is not labile or tearful.        Speech: Speech normal. Speech is not rapid and pressured, slurred or tangential.        Behavior: Behavior is not slowed. Behavior is cooperative.        Thought Content: Thought content normal. Thought content is not paranoid or delusional. Thought content does not include homicidal or suicidal ideation. Thought content does not include suicidal plan.        Cognition and Memory: Cognition and memory normal.        Judgment: Judgment normal.     Comments: Insight fair to good More mood swings andirritability off lithium     Lab Review:     Component Value Date/Time   NA 140 05/15/2023 0930   K 4.0 05/15/2023 0930   CL 102 05/15/2023 0930   CO2 24 05/15/2023 0930   GLUCOSE 93 05/15/2023 0930   GLUCOSE 107 (H) 10/06/2019 1534   BUN 6 05/15/2023 0930   CREATININE 1.34 (H) 05/15/2023 0930   CALCIUM 9.9 05/15/2023 0930   PROT 8.7 (H) 07/20/2015 1129   ALBUMIN 4.9 07/20/2015 1129   AST 37 07/20/2015 1129   ALT 51 07/20/2015 1129   ALKPHOS 79  07/20/2015 1129   BILITOT 0.6 07/20/2015 1129   GFRNONAA 59 (L) 10/26/2020 0833   GFRAA 68 10/26/2020 1610  Component Value Date/Time   WBC 3.4 (L) 09/15/2016 0500   RBC 4.37 (L) 09/15/2016 0500   HGB 13.3 09/15/2016 0500   HCT 38.7 (L) 09/15/2016 0500   PLT 242 09/15/2016 0500   MCV 88.6 09/15/2016 0500   MCH 30.4 09/15/2016 0500   MCHC 34.3 09/15/2016 0500   RDW 13.1 09/15/2016 0500   LYMPHSABS 2.3 07/20/2015 1129   MONOABS 0.2 07/20/2015 1129   EOSABS 0.1 07/20/2015 1129   BASOSABS 0.0 07/20/2015 1129    Lithium Lvl  Date Value Ref Range Status  05/15/2023 0.4 (L) 0.5 - 1.2 mmol/L Final    Comment:    A concentration of 0.5-0.8 mmol/L is advised for long-term use; concentrations of up to 1.2 mmol/L may be necessary during acute treatment.                                  Detection Limit = 0.1                           <0.1 indicates None Detected    05/15/23 lithium level 0.4 on 1200 dialy. CR 1.34 (stable 4 years), Ca 9.9   06/17/22 Cr 1.5, TSH 1.85, lipids done by PCP   No results found for: "PHENYTOIN", "PHENOBARB", "VALPROATE", "CBMZ"   .res Assessment: Plan:    Bipolar I disorder, most recent episode (or current) manic (HCC) - Plan: lamoTRIgine (LAMICTAL) 200 MG tablet, lumateperone tosylate (CAPLYTA) 42 MG capsule  Generalized anxiety disorder - Plan: ALPRAZolam (XANAX) 0.5 MG tablet  Insomnia due to mental condition - Plan: Eszopiclone 3 MG TABS  Drug-induced weight gain  Lithium-induced tremor  Obstructive sleep apnea  Lithium use   30 min video face to face time with patient was spent on counseling and coordination of care. We discussed his diagnoses and answered questions re: bipolar dx, meds, SE. he had received additional parent benefit and mood consistency with the addition of the lithium.   But DT mania in 2023 he's increased lithium 1200 mg daily with benefit but more tremor..   He's decided lithium intolerable and stopped early June 2024  resulting in more mood swing.s  Discussed potential metabolic side effects associated with atypical antipsychotics, as well as potential risk for movement side effects. Advised pt to contact office if movement side effects occur.   We discussed the short-term risks associated with benzodiazepines including sedation and increased fall risk among others.  Discussed long-term side effect risk including dependence, potential withdrawal symptoms, and the potential eventual dose-related risk of dementia.  But recent studies from 2020 dispute this association between benzodiazepines and dementia risk. Newer studies in 2020 do not support an association with dementia.  But did discuss risk of affecting memory in daytime.  Counseled patient regarding potential benefits, risks, and side effects of Lamictal to include potential risk of Stevens-Johnson syndrome. Advised patient to stop taking Lamictal and contact office immediately if rash develops and to seek urgent medical attention if rash is severe and/or spreading quickly.  Also emphasized the need for conisistency with lamotrigine dosing otherwise there is an increased risk of rash.  Disc potential effect on B vitamin levels.   Disc increased rash risk with inconsistency. Disc risk DDI with primidone and if gets more dep or anxious call and will need to increase lamotrigine.  Watch alcohol.  Hx some excess.  Denies a problem now.  06/06/21  lithium level 0.5.  Hold Lithium 1200 mg daily BC tremor.    mania spring 2023 and he was more level with it  05/15/23 lithium level 0.4 on 1200 dialy. CR 1.34 (stable 4 years), Ca 9.9  Continue FMLA to have leave for episodic flares of condition intermittent estimated 1-5 days, frequency approximately every 2 mos.  This satisfied his work and needs to be renewed.    Continue lamotrigine 200 mg BID  OK use with Xanax.    He can take Lunesta 3 mg nightly but he cannot increase the dose above that.  Options caplyta  or Tegretol best ones.  Disc SE Best option start Caplyta 42 mg HS,  reduce Seroquel to 1 tablet at night  Disc the off-label use of N-Acetylcysteine at 1200 mg daily to help with mild cognitive problems.  It can be combined with a B-complex vitamin as the B-12 and folate have been shown to sometimes enhance the effect. This is for complaints of forgetfulness.  Discussed the risks and in his case necessity of polypharmacy.  He is agreeable to continue the current meds.  Residual tremor off lithium.  Option retry propranolol or primidone for it.  FU 8 weeks  Meredith Staggers, MD, DFAPA   Please see After Visit Summary for patient specific instructions.  No future appointments.     No orders of the defined types were placed in this encounter.       -------------------------------

## 2023-07-28 ENCOUNTER — Telehealth: Payer: Self-pay | Admitting: Psychiatry

## 2023-07-28 DIAGNOSIS — F3110 Bipolar disorder, current episode manic without psychotic features, unspecified: Secondary | ICD-10-CM

## 2023-07-28 NOTE — Telephone Encounter (Signed)
Pt Lvm 9/9 @ 8:20p.  He said the Caplyta that Dr Jennelle Human has him on for the tremors has helped greatly.  He said he still has them, but they have lessened.  He said the tremors are about 90% better. For that reason, he would like to push out his follow up appt and ask if Dr Jennelle Human will just send in a refill for now to  [id number] [place], [place] - [address] AT [facility] [address] [place], [place] [address] Phone: [phone]  Fax: [phone]    Next appt 9/30

## 2023-07-30 NOTE — Telephone Encounter (Signed)
Please call patient to schedule an appt 6 weeks out and cancel his appt for 9/30.

## 2023-07-30 NOTE — Telephone Encounter (Signed)
Patient reporting tremors are about 90% better with the Caplyta. He would like to continue Caplyta and push out FU for now. He is currently scheduled for 9/30.

## 2023-07-30 NOTE — Telephone Encounter (Signed)
I am glad the Caplyta is helping.  Send in a 30-day prescription of Caplyta 42 mg 1 daily +1 refill. He can defer the appointment on September 30 I suppose but since he has recently started a new medication I still want to see him within the next 6 weeks or so.

## 2023-08-02 MED ORDER — LUMATEPERONE TOSYLATE 42 MG PO CAPS: 42.0000 mg | ORAL_CAPSULE | Freq: Every day | ORAL | 1 refills | Status: DC

## 2023-08-02 NOTE — Telephone Encounter (Signed)
Rx sent 

## 2023-08-17 ENCOUNTER — Telehealth: Payer: 59 | Admitting: Psychiatry

## 2023-08-31 ENCOUNTER — Telehealth: Payer: Self-pay | Admitting: Psychiatry

## 2023-08-31 ENCOUNTER — Other Ambulatory Visit: Payer: Self-pay

## 2023-08-31 NOTE — Telephone Encounter (Signed)
RF Caplyta 42mg  - Can we send in Caplyta for 3 month supply to [facility]. If not, [facility]:  [facility] [facility] [address] [id number] - [place], [place] - [address]

## 2023-09-04 ENCOUNTER — Other Ambulatory Visit: Payer: Self-pay

## 2023-09-04 NOTE — Telephone Encounter (Signed)
Noted thank you

## 2023-09-04 NOTE — Telephone Encounter (Signed)
Relayed msg to Michael Mclean that there is not a generic Caplyta and that he has a rf at the pharmacy. He said to continue sending rx to local pharmacy.

## 2023-09-04 NOTE — Telephone Encounter (Signed)
Spoke with pt . He asked if there was a generic form of Caplyta and if so he would like to get the 3 month supply by mail order; if not, he is fine with the 30 day supply going to local pharmacy [address] [place], [place]

## 2023-09-04 NOTE — Telephone Encounter (Signed)
Apologize for delay but yes we can do a 3 month supply, only because he isn't scheduled until December so it will be about the same amount anyway. Usually that medication also needs a prior authorization but as long as his insurance hasn't changed and we still have a PA on file he should be good.

## 2023-09-30 ENCOUNTER — Encounter: Payer: Self-pay | Admitting: Psychiatry

## 2023-10-05 ENCOUNTER — Telehealth: Payer: Self-pay | Admitting: Psychiatry

## 2023-10-05 DIAGNOSIS — F3110 Bipolar disorder, current episode manic without psychotic features, unspecified: Secondary | ICD-10-CM

## 2023-10-05 NOTE — Telephone Encounter (Addendum)
Mailbox is full. Sent MyChart message to call.

## 2023-10-05 NOTE — Telephone Encounter (Signed)
Pt lvm that he is not sleeping well. He goes to bed early and wakes up early. He is taking the lunesta and anxiety medicine. He would like to go back on seroquel to help him sleep. Please call him at [phone]

## 2023-10-06 ENCOUNTER — Other Ambulatory Visit: Payer: Self-pay | Admitting: Psychiatry

## 2023-10-06 DIAGNOSIS — F5105 Insomnia due to other mental disorder: Secondary | ICD-10-CM

## 2023-10-06 MED ORDER — ESZOPICLONE 3 MG PO TABS: ORAL_TABLET | ORAL | 0 refills | Status: DC

## 2023-10-06 MED ORDER — QUETIAPINE FUMARATE 300 MG PO TABS
ORAL_TABLET | ORAL | 0 refills | Status: DC
Start: 2023-10-06 — End: 2023-10-08

## 2023-10-06 NOTE — Telephone Encounter (Signed)
Patient returned call. He needs a RF of Lunesta and Seroquel. [facility] [place], pended Rx for Lunesta to the same pharmacy.

## 2023-10-06 NOTE — Telephone Encounter (Signed)
Lf 08/15; lv 07/31; nv 12/3

## 2023-10-06 NOTE — Addendum Note (Signed)
Addended by: Karin Lieu T on: 10/06/2023 11:18 AM   Modules accepted: Orders

## 2023-10-06 NOTE — Telephone Encounter (Signed)
Please send to [facility] in [place], [place] for [facility]

## 2023-10-06 NOTE — Telephone Encounter (Signed)
Pt lvm message last night stating that he needs a refill on his luneata sent to local pharmacy since he is out. He is off work today so call him at [phone]

## 2023-10-07 ENCOUNTER — Other Ambulatory Visit: Payer: Self-pay

## 2023-10-07 DIAGNOSIS — F5105 Insomnia due to other mental disorder: Secondary | ICD-10-CM

## 2023-10-07 MED ORDER — ESZOPICLONE 3 MG PO TABS: ORAL_TABLET | ORAL | 0 refills | Status: DC

## 2023-10-08 ENCOUNTER — Other Ambulatory Visit: Payer: Self-pay

## 2023-10-08 DIAGNOSIS — F3110 Bipolar disorder, current episode manic without psychotic features, unspecified: Secondary | ICD-10-CM

## 2023-10-08 MED ORDER — QUETIAPINE FUMARATE 300 MG PO TABS: ORAL_TABLET | ORAL | 0 refills | Status: DC

## 2023-10-13 ENCOUNTER — Other Ambulatory Visit: Payer: Self-pay | Admitting: Psychiatry

## 2023-10-13 ENCOUNTER — Other Ambulatory Visit: Payer: Self-pay

## 2023-10-13 DIAGNOSIS — F411 Generalized anxiety disorder: Secondary | ICD-10-CM

## 2023-10-20 ENCOUNTER — Encounter: Payer: Self-pay | Admitting: Psychiatry

## 2023-10-20 ENCOUNTER — Telehealth: Payer: 59 | Admitting: Psychiatry

## 2023-10-20 DIAGNOSIS — F411 Generalized anxiety disorder: Secondary | ICD-10-CM

## 2023-10-20 DIAGNOSIS — R635 Abnormal weight gain: Secondary | ICD-10-CM

## 2023-10-20 DIAGNOSIS — T50905A Adverse effect of unspecified drugs, medicaments and biological substances, initial encounter: Secondary | ICD-10-CM

## 2023-10-20 DIAGNOSIS — F5105 Insomnia due to other mental disorder: Secondary | ICD-10-CM

## 2023-10-20 DIAGNOSIS — F3110 Bipolar disorder, current episode manic without psychotic features, unspecified: Secondary | ICD-10-CM | POA: Diagnosis not present

## 2023-10-20 DIAGNOSIS — G4733 Obstructive sleep apnea (adult) (pediatric): Secondary | ICD-10-CM

## 2023-10-20 MED ORDER — ALPRAZOLAM 0.5 MG PO TABS: 0.5000 mg | ORAL_TABLET | Freq: Three times a day (TID) | ORAL | 2 refills | Status: DC | PRN

## 2023-10-20 MED ORDER — QUETIAPINE FUMARATE 200 MG PO TABS: 200.0000 mg | ORAL_TABLET | Freq: Every day | ORAL | 0 refills | Status: DC

## 2023-10-20 MED ORDER — LAMOTRIGINE 200 MG PO TABS: 200.0000 mg | ORAL_TABLET | Freq: Two times a day (BID) | ORAL | 1 refills | Status: DC

## 2023-10-20 MED ORDER — LUMATEPERONE TOSYLATE 42 MG PO CAPS
42.0000 mg | ORAL_CAPSULE | Freq: Every day | ORAL | 2 refills | Status: DC
Start: 2023-10-20 — End: 2024-02-09

## 2023-10-20 MED ORDER — ESZOPICLONE 3 MG PO TABS: ORAL_TABLET | ORAL | 2 refills | Status: DC

## 2023-10-20 NOTE — Progress Notes (Signed)
Michael Mclean [id number] [birth year] 52 y.o.  Video Visit via My Chart  I connected with pt by My Chart video and verified that I am speaking with the correct person using two identifiers.   I discussed the limitations, risks, security and privacy concerns of performing an evaluation and management service by My Chart  and the availability of in person appointments. I also discussed with the patient that there may be a patient responsible charge related to this service. The patient expressed understanding and agreed to proceed.  I discussed the assessment and treatment plan with the patient. The patient was provided an opportunity to ask questions and all were answered. The patient agreed with the plan and demonstrated an understanding of the instructions.   The patient was advised to call back or seek an in-person evaluation if the symptoms worsen or if the condition fails to improve as anticipated.  I provided 30 minutes of video time during this encounter.  The patient was located at home and the provider was located office.  Session started at [phone]  Subjective:   Patient ID:  Michael Mclean is a 52 y.o. (DOB [birth year]) male.  Chief Complaint:  No chief complaint on file.   Medication Refill Pertinent negatives include no fatigue, nausea or weakness.  Anxiety Symptoms include nervous/anxious behavior. Patient reports no confusion, decreased concentration, dizziness, nausea, palpitations or suicidal ideas.    Depression        Associated symptoms include no decreased concentration, no fatigue and no suicidal ideas.  Past medical history includes anxiety.    Michael Mclean is due for follow-up of bipolar disorder.  seen August 11, 2019.  Due to erectile dysfunction Seroquel XR was reduced to 600 mg daily from 800 mg daily.  Also lithium level and labs were ordered.  seen October 06, 2019.  Serum lithium level, TSH, BMP.  Check it.  He wants to go now this afternoon.  It  will not be a trough but it is hard for him to get off of work.  We will expect a lower than usual blood level. Start Vraylar 1.5 mg 1 capsule daily Reduce Seroquel XR to 1 1/2 nightly for 1 week then 1 nightly  seen November 17, 2019.  The following changes were noted: Reduce Vraylar 1.5 mg to every other daily DT restlessness change Seroquel 300mg   1 nightly to improve his sleep which should also help with mood stability.  He called January 15 complaining of insomnia.  Had taken up to 6 Lunesta and 1 night!  He was informed this was dangerous and not to do so again.  He was instructed to increase the quetiapine by an extra half tablet and to increase the Vraylar to 3 mg daily.  It was felt he was having some manic symptoms.  01/26/2020 appointment with the following noted: Admits he had manic sx in January.  Resolved with med changes.  Had to increase the Seroquel to control mania.   Mania had affected work and insomnia.  Tried Vraylar in hopes of being able to reduce eliminate Seroquel.  Was missing work, but not now.  But having problem with FMLA with new supervisor.  Says the episodes have been up to 5 days but the FMLA says leaves would be anticipated for 7-14 days.    Usual Xanax is 2 tablets every 3 days for anxiety. Pretty good.  Job is still really stressful and only real concern. .  Lasts 3 mos.  Job is very stressful.  And volume demands.  Lives in [place]. Married June 2020 and right after fell apart for 3-5 days.  Lost control for a little but normal again now.   Seroquel and lithium work well for sleep and mood.  No problems with reduction in Seroquel in mood and the sexual SE are better.  Used Viagra twice. He reduced the lithium back to 900 mg bc felt like it was all that was necessary.  A little scared to increase the lithium bc hand jerks at times.  Some tremor.  Scares him.  Little anger an impulsivity at this time but is a little.  Notices he can't drink much with the  lithiium.  When drinks he does too much and may not sleep and then has a really negative effect on him. Lithium has helped manage with less anger and impulsivity.  Pleased with the response.  Did not have this level of control before.  Wants to try to get on less meds and his fiancee' is suggesting it.  Feels guilty about being dependent on the meds.  Plan: It was felt Michael Mclean was probably not having significant clinical effect and in order to reduce polypharmacy it was discontinued.  05/11/2020 appointment with the following noted: Taking Xanax prn.  Disc FMLA amendment helpful.  Tolerating meds.   Overall doing relatively well.  One period when couldn't work for 2-3 days DT anxiety and irritability.  Otherwise better with change lifestyle with marriage and better routine. No problems off the Vraylar.  The increase Seroquel in the past helped sleep and mood and only needed Lunesta 2-3 times since here.  Work function is typical.  Remains stressful job.  Expects that and handling it a little better.  Better self confidence.   Wife also started working at [employer] too.   Plan: no med changes  08/27/20 appt with following noted: Episode early July after out of lamotrigine for several weeks.  Got in huge fight with wife and missed a week of work.  Otherwise is OK since then.   Out of Armodafinil now for 4-6 weeks.  Managing with mild sleepiness.  Sleep is OK usually with prn. Minimal tremor. Patient reports stable mood and denies depressed or irritable moods usually.  Patient has a little more recent difficulty with anxiety.   Denies appetite disturbance.  Patient reports that energy and motivation have been good.  Patient denies any difficulty with concentration.  Patient denies any suicidal ideation. Plan: no med changes.  Check lithium level.  01/07/21 appt noted: DC from hospital [facility] [place] on Friday 01/05/20.  During week drinks only a couple and more on weekend.  Plans to drink only weeknds  now. Admitted 12/26/20 with emotions of anger and sorry and regret over prior decisions. What put him over was Ex and he has no say over his daughter's care.  Cut his wrists in [place].  Wife caught him in the process.   Gabapentin 300 added for anxiety.  Not sure if med changes helped.  No fatigue. SE more tremor.  Anxiety still present. Don't have sorrow and anger he had before.  I feel better without SI. Slept OK weekend overall except anxiety about the situation.  Taking Lunesta nightly.  ? Re FMLA. Plan: Increase gabapentin 600 mg TID for anxiety and tremor.  01/18/21 appt: Better off gabapentin DT sexual SE about 3 days ago.  Didn't help anxiety or tremor. Still some anxiety and job stress but handled it OK.  Missed no sig days since here.  Work function not good the first week back. Right now not depressed and not suicidal.  No weapons in the house.  Last SI in hospital. Stress from child's mother and bills can cause him to spiral somewhat.   Taking Lunesta consistently.  Taking Xanax TID and no SE. Plan no med changes  02/13/2021 appt noted: Even keel .  No SI.  Better than last time.  Learned things to help him not get back in the way he was in [place].  Problems with lamotrigine refill. Missed 5-7 days of lamotrigine.  Learned from classes at the hospital.   RTW 2.22.22.  They made a lot of changes there so it's a challenge with fast pace.  Doing well so far.  Louann Sjogren says he's doing OK. Sleep good with Lunesta Seroquel.   Plan: Restart lamotrigine 1/2 of 200 mg tablet for 5 days, then 1 tablet daily for 5 days, then 1 and 1/2 tablets daily for 5 days then 1 twice daily.  10/2021 patient's wife called concerned because he was cutting himself.  Was not a suicide attempt. 04/01/2021 patient evaluated in the ER.  And diagnosed with vertigo  05/17/2021 appointment with the following noted: Vertigo lasted about 2 mos off and on.   Says wife got info about cutting were gone.  He had scratched himself.   He was not suicidal and did not cut himself.  He is not cutting. Lately overall more stable but is under a bit of stress and is anxious.  Seems like the Xanax is not working as well.  Wonders about switching BZ. Increased work demands.   Sometimes some manic sx with increased talking and hyperactivity including last week.  Depression managed.   Sleeep is good.  Impulsivity managed bc debt.   Plan: Get lithium level today. Continue Seroquel 450mg  nightly to improve his sleep which should also help with mood stability.  Had mania at lower dose and sedation at higher dose.  Polypharmacy in hopes lithium alone will be sufficient at preventing mania. Continue FMLA to have leave for episodic flares of condition intermittent estimated 1-5 days, frequency approximately every 2 mos.  This satisfied his work. Continue lamotrigine 200 mg BID OK trial clonazepam 0.5 mg TID  07/10/2021 phone call: Next visit is 09/16/21. [place] called and said that the Seroquel and Lithium he has been taking is causing weight gain and is making his hands swollen. He would like to discuss  the side effects and what he can do to avoid these from happening.  He was told that these are major and important mood stabilizing medications for him and we cannot make these changes without an appointment.  09/16/2021 appointment with the following noted: Takes clonazepam 0.5 and 1 mg rarely and not a lot of effectiveness so still using Xanax 0.5 mg but takes 3-4 tablets twice daily if having bad day of anxiety.  Denies SE.  Takes Xanax about once daily routinely. Average 2-4 daily. Taking lamotrigine 200 mg BID, Seroquel 450.  Ran out past few days. Doesn't sleep well without meds. Problems with child's mother and huge disagreements over caretaking and child support. Really stressful job, over scrutinized so lives anxiety 7-8/10 and better after work. Concerned about meds and weight gain. Hungry and thinks it's Seroquel. Plan:  RE:   Seroquel 450mg  nightly Had mania at lower dose and sedation at higher dose. Start Latuda 30 mg for 1 week then 60 mg daily. Continue FMLA  to have leave for episodic flares of condition intermittent estimated 1-5 days, frequency approximately every 2 mos.  This satisfied his work. Continue lamotrigine 200 mg BID trial propranolol 40-60 mg BID prn anxiety.  OK use with Xanax.  Need try to reduce Xanax dose.  Follow-up 6 weeks  10/16/2021 phone call this patient was content fused about how he was supposed to be taking Latuda and Seroquel.  He was taking it appropriately at quetiapine 450 mg nightly and Latuda 60 mg p.m.  With a goal of weaning the Seroquel he was encouraged to reduce that to 300 mg nightly.  11/27/2021 phone call complaining of difficulty sleeping with the lower dose of Seroquel and wanting to increase it back to 450 mg nightly.  This is an increase was agreed.  12/13/2021 phone call: Complaining of poor sleep and more mania. He had not increased the Seroquel to 450 mg daily and was taking Latuda 60 mg and wanted to stop it.  12/30/2021 appointment the following noted:  Didn't like Latuda bc felt he was more manic on it with more spending and credit card debt.   Off the Latuda about 3 weeks. Rather deal with the weight than have unstable mood. Mood is more stable back on Seroquel 450 mg nightly with the lithium 900 mg nightly.  He is not manic currently.  He is needing the Lunesta to sleep.  Admits to taking 2 at a time on occasion. No longer drinks too much. He sees benefit.  [name] says he's well.   Plan: Continue Lithium 900 mg daily. continue  Seroquel 450mg  nightly Had mania at lower dose and sedation at higher dose. For antipsychotic related weight gain we will add metformin 500 mg daily for 1 week then 500 mg twice daily if tolerated since the attempt to switch from Seroquel to Latuda failed.  02/26/22 appt noted: Asks for Lunesta bc often needs something to help him go to  sleep.   Mood is better.  Handled stressors.  Patient reports stable mood and denies depressed or irritable moods.  Patient denies any recent difficulty with anxiety.  Patient denies difficulty with sleep initiation or maintenance. Denies appetite disturbance.  Patient reports that energy and motivation have been good.  Patient denies any difficulty with concentration.  Patient denies any suicidal ideation. Wife OK after pacemaker replacmement. No benefit with metformin 500 BID for weight and willing to increase. No othert SE  12/03/22 appt noted: Meds: Seroquel 450 HS, lithium 900 mg HS, lamotrigine 200 mg   , metformin 500 mg BID (SE diarrhea 1000 mg BID) Uses Xanax 0.5 mg prn anxiety. No SE.  Not using Lunesta BC $25/RX Thinks he went through manic stage with spending and credit cards in April and [place] in September.  He doesn't recognize mania at the time but looking back on it sees it.  Did chapter 21. Mood is OK at present and relieved bc not getting creditor calls. Usual seasonal depression is OK> No SE with meds. He says wife thinks he argues too much.  No excess spending and ok function at work.   Sleep ok with meds. Plan: trial propranolol for anxiety  03/26/23 appt noted: Meds: Seroquel 450 HS, lithium 1200 mg HS, lamotrigine 200 mg .   Uses Xanax 0.5 mg prn anxiety and tremro. No SE.  Lunesta 3mg  HS Alprazolam almost daily before work and at bedtime.    Doesn't feel like it helps as much as in the past..  it also  helps the tremor.  No sleepiness or SE.     Meds as above. Has a very stressful day.  Can have anxiety other times but alprazolam is helpful for tremor and propranolol didn't help tremor.  Increased lithium to 1200  wife said he's more level headed.  Probably tremor is some worse after the increase lithium.  Embarrassing.   To me I always struggle with dep but month Feb is worse with anniversary of mother's death 2019-10-28.  It still hurts.   Chronic struggles with money.   Been through bankruptcy before but didn't cover everything. SI resolved after psych hosp. Sleep good with combo Lunesta and alprazolam.  Can't sleep without it.   Feels ok with work function.  More reasonable demands at work lately. Plan: Need another level and BMP  emphasized.    06/17/23 appt noted: Stopped lithium 04/18/23. Tremor got unbearable & jerks. Tremor is still there but really mild compared to before. More mood swings off the lithium Psych med: no lithium, quetiapine 450 HS, lamotrigine 200 BID, Lunesta 3 mg HS, Xanax 0.5 mg TID Wife thinks more mood swings off lithium and he notices sad and crying uncontrollably and getting up in middle of night writing at times.  Writing texts to wife in middle of the night. Prior mania with credit card debt and bankruptcy. Plan: Best option start Caplyta 42 mg HS,  reduce Seroquel to 1 tablet at night  10/20/23 appt noted: Meds: Caplyta 42 mg HS, quetiapine 300 mg HS, Lunesta 3 mg HS, Xanax 0.5 mg TID Caplyta made him feel like himself. Crashed when let himself  run out of meds except Caplyta. Missed 5 days of sleep.  Sleep was ok with meds.      Past Psychiatric Medication Trials:  Depakote paranoia delusions of parasitosis, topiramate, lamotrigine, Seroquel 800 sedating,   Abilify 20 ,  Vraylar 3 mg restless and couldn't sleep,  Latuda 60 mg with Seroquel 300 mg mania lithium 1200 side effect tremor but helped  Gabapentin NR and SE Nuvigil,  Wellbutrin, Viibryd,  trazodone 400,  Sonata, amitriptyline, Ambien, Lunesta worked  propranolol 40-60 mg BID prn anxiety NR Propranolol NR for anxiety. Primidone minimal effect for tremor  Metformin 1000 BID diarrhea no benefit for wt    Review of Systems:  Review of Systems  Constitutional:  Positive for unexpected weight change. Negative for fatigue.  Eyes:  Positive for pain.  Cardiovascular:  Negative for palpitations.       Hypertension  Gastrointestinal:  Negative for nausea.   Neurological:  Positive for tremors. Negative for dizziness and weakness.  Psychiatric/Behavioral:  Positive for sleep disturbance. Negative for agitation, behavioral problems, confusion, decreased concentration, dysphoric mood, hallucinations, self-injury and suicidal ideas. The patient is nervous/anxious. The patient is not hyperactive.     Medications: I have reviewed the patient's current medications.  Current Outpatient Medications  Medication Sig Dispense Refill   ALPRAZolam (XANAX) 0.5 MG tablet TAKE 1 TABLET THREE TIMES A DAY AS NEEDED FOR ANXIETY 90 tablet 0   amLODipine (NORVASC) 10 MG tablet Take 10 mg by mouth daily.     atorvastatin (LIPITOR) 40 MG tablet Take 40 mg by mouth daily.     Eszopiclone 3 MG TABS Take one tablet immediately before bedtime 30 tablet 0   ibuprofen (ADVIL,MOTRIN) 400 MG tablet Take 1 tablet (400 mg total) by mouth every 6 (six) hours as needed for moderate pain. 12 tablet 0   lamoTRIgine (LAMICTAL) 200 MG tablet Take 1  tablet (200 mg total) by mouth 2 (two) times daily. 180 tablet 1   lumateperone tosylate (CAPLYTA) 42 MG capsule Take 1 capsule (42 mg total) by mouth daily. 30 capsule 1   QUEtiapine (SEROQUEL) 300 MG tablet TAKE ONE TABLET AT BEDTIME 90 tablet 0   sildenafil (VIAGRA) 100 MG tablet Take by mouth.     valACYclovir (VALTREX) 500 MG tablet Take 500 mg by mouth. Takes for 5 days     No current facility-administered medications for this visit.    Medication Side Effects: None sexual SE are better  Allergies: No Known Allergies  Past Medical History:  Diagnosis Date   Anxiety disorder    Bipolar 1 disorder (HCC)    Hyperlipidemia    Hypertension     History reviewed. No pertinent family history.  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  Tobacco Use   Smoking status: Never   Smokeless tobacco: Never  Substance and Sexual Activity   Alcohol use: Yes   Drug use: No   Sexual activity: Not on file  Other Topics Concern   Not on file  Social History Narrative   Not on file   Social Determinants of Health   Financial Resource Strain: Low Risk  (08/06/2023)   [facility]   Overall Financial Resource Strain (CARDIA)    Difficulty of Paying Living Expenses: Not very hard  Food Insecurity: Food Insecurity Present (08/06/2023)   [facility] from [facility]   Hunger Vital Sign    Worried About Running Out of Food in the Last Year: Sometimes true    Ran Out of Food in the Last Year: Sometimes true  Transportation Needs: No Transportation Needs (08/06/2023)   Received from [facility] - Transportation    In the past 12 months, has lack of transportation kept you from medical appointments or from getting medications?: No    Lack of Transportation (Non-Medical): No  Physical Activity: Not on file  Stress: Not on file  Social Connections: Not on file  Intimate Partner Violence: Not on file    Past Medical History, Surgical history, Social history, and Family history were reviewed and updated as appropriate.   Please see review of systems for further details on the patient's review from today.   Objective:   Physical Exam:  There were no vitals taken for this visit.  Physical Exam Neurological:     Mental Status: He is alert and oriented to person, place, and time.     Cranial Nerves: No dysarthria.  Psychiatric:        Attention and Perception: Attention and perception normal.        Mood and Affect: Mood is anxious. Mood is not depressed. Affect is not labile or tearful.        Speech: Speech normal. Speech is not rapid and pressured, slurred or tangential.        Behavior:  Behavior is not slowed. Behavior is cooperative.        Thought Content: Thought content normal. Thought content is not paranoid or delusional. Thought content does not include homicidal or suicidal ideation. Thought content does not include  suicidal plan.        Cognition and Memory: Cognition and memory normal.        Judgment: Judgment normal.     Comments: Insight fair to good less mood swings with Caplyta     Lab Review:     Component Value Date/Time   NA 140 05/15/2023 0930   K 4.0 05/15/2023 0930   CL 102 05/15/2023 0930   CO2 24 05/15/2023 0930   GLUCOSE 93 05/15/2023 0930   GLUCOSE 107 (H) 10/06/2019 1534   BUN 6 05/15/2023 0930   CREATININE 1.34 (H) 05/15/2023 0930   CALCIUM 9.9 05/15/2023 0930   PROT 8.7 (H) 07/20/2015 1129   ALBUMIN 4.9 07/20/2015 1129   AST 37 07/20/2015 1129   ALT 51 07/20/2015 1129   ALKPHOS 79 07/20/2015 1129   BILITOT 0.6 07/20/2015 1129   GFRNONAA 59 (L) 10/26/2020 0833   GFRAA 68 10/26/2020 0833       Component Value Date/Time   WBC 3.4 (L) 09/15/2016 0500   RBC 4.37 (L) 09/15/2016 0500   HGB 13.3 09/15/2016 0500   HCT 38.7 (L) 09/15/2016 0500   PLT 242 09/15/2016 0500   MCV 88.6 09/15/2016 0500   MCH 30.4 09/15/2016 0500   MCHC 34.3 09/15/2016 0500   RDW 13.1 09/15/2016 0500   LYMPHSABS 2.3 07/20/2015 1129   MONOABS 0.2 07/20/2015 1129   EOSABS 0.1 07/20/2015 1129   BASOSABS 0.0 07/20/2015 1129    Lithium Lvl  Date Value Ref Range Status  05/15/2023 0.4 (L) 0.5 - 1.2 mmol/L Final    Comment:    A concentration of 0.5-0.8 mmol/L is advised for long-term use; concentrations of up to 1.2 mmol/L may be necessary during acute treatment.                                  Detection Limit = 0.1                           <0.1 indicates None Detected    05/15/23 lithium level 0.4 on 1200 dialy. CR 1.34 (stable 4 years), Ca 9.9   06/17/22 Cr 1.5, TSH 1.85, lipids done by PCP   No results found for: "PHENYTOIN", "PHENOBARB",  "VALPROATE", "CBMZ"   .res Assessment: Plan:    Bipolar I disorder, most recent episode (or current) manic (HCC)  Generalized anxiety disorder  Insomnia due to mental condition  Drug-induced weight gain  Obstructive sleep apnea   30 min video face to face time with patient was spent on counseling and coordination of care. We discussed his diagnoses and answered questions re: bipolar dx, meds, SE. he had received additional parent benefit and mood consistency with the addition of the lithium.   But DT mania in 2023 he's increased lithium 1200 mg daily with benefit but more tremor..   He's decided lithium intolerable and stopped early June 2024 resulting in more mood swing.s  Much better mood with Caplyta.   Didn't do well off lithium on just other meds.  Discussed potential metabolic side effects associated with atypical antipsychotics, as well as potential risk for movement side effects. Advised pt to contact office if movement side effects occur.   We discussed the short-term risks associated with benzodiazepines including sedation and increased fall risk among others.  Discussed long-term side effect risk including dependence, potential withdrawal symptoms, and the potential eventual dose-related  risk of dementia.  But recent studies from 2020 dispute this association between benzodiazepines and dementia risk. Newer studies in 2020 do not support an association with dementia.  But did discuss risk of affecting memory in daytime.  Counseled patient regarding potential benefits, risks, and side effects of Lamictal to include potential risk of Stevens-Johnson syndrome. Advised patient to stop taking Lamictal and contact office immediately if rash develops and to seek urgent medical attention if rash is severe and/or spreading quickly.  Also emphasized the need for conisistency with lamotrigine dosing otherwise there is an increased risk of rash.  Disc potential effect on B vitamin levels.    Disc increased rash risk with inconsistency. Disc risk DDI with primidone and if gets more dep or anxious call and will need to increase lamotrigine.  Watch alcohol.  Hx some excess.  Denies a problem now.  06/06/21 lithium level 0.5.  Hold Lithium 1200 mg daily BC tremor.    mania spring 2023 and he was more level with it  05/15/23 lithium level 0.4 on 1200 dialy. CR 1.34 (stable 4 years), Ca 9.9  Continue FMLA to have leave for episodic flares of condition intermittent estimated 1-5 days, frequency approximately every 2 mos.  This satisfied his work and needs to be renewed.    Continue lamotrigine 200 mg BID  OK use with Xanax.    He can take Lunesta 3 mg nightly but he cannot increase the dose above that.  Options caplyta or Tegretol best ones.  Disc SE Best option continue Caplyta 42 mg HS,  reduce Seroquel to 200 mg tablet at night.  Will try to reduce to lowest possible dose.  Disc the off-label use of N-Acetylcysteine at 1200 mg daily to help with mild cognitive problems.  It can be combined with a B-complex vitamin as the B-12 and folate have been shown to sometimes enhance the effect. This is for complaints of forgetfulness.  Discussed the risks and in his case necessity of polypharmacy.  He is agreeable to continue the current meds.  Residual tremor off lithium.  Option retry propranolol or primidone for it.  FU 3 mos  Meredith Staggers, MD, DFAPA   Please see After Visit Summary for patient specific instructions.  No future appointments.     No orders of the defined types were placed in this encounter.       -------------------------------

## 2023-12-15 ENCOUNTER — Telehealth: Payer: Self-pay | Admitting: Psychiatry

## 2023-12-15 DIAGNOSIS — Z0289 Encounter for other administrative examinations: Secondary | ICD-10-CM

## 2023-12-15 NOTE — Telephone Encounter (Signed)
Sun faxed over updated FMLA forms for completion from [employer] [employer] [facility]. He paid for forms and is requesting by 1/31. Given to Traci to complete.

## 2023-12-16 NOTE — Telephone Encounter (Signed)
Paperwork completed, signed and faxed per request.

## 2024-01-05 ENCOUNTER — Other Ambulatory Visit: Payer: Self-pay | Admitting: Psychiatry

## 2024-01-05 DIAGNOSIS — F3110 Bipolar disorder, current episode manic without psychotic features, unspecified: Secondary | ICD-10-CM

## 2024-01-23 ENCOUNTER — Other Ambulatory Visit: Payer: Self-pay | Admitting: Psychiatry

## 2024-01-23 DIAGNOSIS — F411 Generalized anxiety disorder: Secondary | ICD-10-CM

## 2024-01-28 ENCOUNTER — Other Ambulatory Visit: Payer: Self-pay | Admitting: Psychiatry

## 2024-01-28 DIAGNOSIS — F5105 Insomnia due to other mental disorder: Secondary | ICD-10-CM

## 2024-02-09 ENCOUNTER — Encounter: Payer: Self-pay | Admitting: Psychiatry

## 2024-02-09 ENCOUNTER — Telehealth: Payer: 59 | Admitting: Psychiatry

## 2024-02-09 DIAGNOSIS — R635 Abnormal weight gain: Secondary | ICD-10-CM

## 2024-02-09 DIAGNOSIS — G251 Drug-induced tremor: Secondary | ICD-10-CM

## 2024-02-09 DIAGNOSIS — Z79899 Other long term (current) drug therapy: Secondary | ICD-10-CM

## 2024-02-09 DIAGNOSIS — F411 Generalized anxiety disorder: Secondary | ICD-10-CM | POA: Diagnosis not present

## 2024-02-09 DIAGNOSIS — G4733 Obstructive sleep apnea (adult) (pediatric): Secondary | ICD-10-CM

## 2024-02-09 DIAGNOSIS — F5105 Insomnia due to other mental disorder: Secondary | ICD-10-CM

## 2024-02-09 DIAGNOSIS — T50905A Adverse effect of unspecified drugs, medicaments and biological substances, initial encounter: Secondary | ICD-10-CM

## 2024-02-09 DIAGNOSIS — F3110 Bipolar disorder, current episode manic without psychotic features, unspecified: Secondary | ICD-10-CM

## 2024-02-09 MED ORDER — LUMATEPERONE TOSYLATE 42 MG PO CAPS: 42.0000 mg | ORAL_CAPSULE | Freq: Every day | ORAL | 0 refills | Status: DC

## 2024-02-09 MED ORDER — ALPRAZOLAM 0.5 MG PO TABS: 0.5000 mg | ORAL_TABLET | Freq: Three times a day (TID) | ORAL | 2 refills | Status: DC

## 2024-02-09 MED ORDER — ESZOPICLONE 3 MG PO TABS: ORAL_TABLET | ORAL | 0 refills | Status: DC

## 2024-02-09 MED ORDER — LAMOTRIGINE 200 MG PO TABS: 200.0000 mg | ORAL_TABLET | Freq: Two times a day (BID) | ORAL | 1 refills | Status: DC

## 2024-02-09 NOTE — Progress Notes (Signed)
 Michael Mclean [id number] 06-21-1971 53 y.o.  Video Visit via My Chart  I connected with pt by My Chart video and verified that I am speaking with the correct person using two identifiers.   I discussed the limitations, risks, security and privacy concerns of performing an evaluation and management service by My Chart  and the availability of in person appointments. I also discussed with the patient that there may be a patient responsible charge related to this service. The patient expressed understanding and agreed to proceed.  I discussed the assessment and treatment plan with the patient. The patient was provided an opportunity to ask questions and all were answered. The patient agreed with the plan and demonstrated an understanding of the instructions.   The patient was advised to call back or seek an in-person evaluation if the symptoms worsen or if the condition fails to improve as anticipated.  I provided 30 minutes of video time during this encounter.  The patient was located at home and the provider was located office.  Session started at 200-230  Subjective:   Patient ID:  Michael Mclean is a 53 y.o. (DOB 07/04/1971) male.  Chief Complaint:  Chief Complaint  Patient presents with   Follow-up   Stress   Medication Reaction    Medication Refill Pertinent negatives include no fatigue, nausea or weakness.  Anxiety Symptoms include nervous/anxious behavior. Patient reports no confusion, decreased concentration, dizziness, nausea, palpitations or suicidal ideas.    Depression        Associated symptoms include no decreased concentration, no fatigue and no suicidal ideas.  Past medical history includes anxiety.    Michael Mclean is due for follow-up of bipolar disorder.  seen August 11, 2019.  Due to erectile dysfunction Seroquel XR was reduced to 600 mg daily from 800 mg daily.  Also lithium level and labs were ordered.  seen October 06, 2019.  Serum lithium level,  TSH, BMP.  Check it.  He wants to go now this afternoon.  It will not be a trough but it is hard for him to get off of work.  We will expect a lower than usual blood level. Start Vraylar 1.5 mg 1 capsule daily Reduce Seroquel XR to 1 1/2 nightly for 1 week then 1 nightly  seen November 17, 2019.  The following changes were noted: Reduce Vraylar 1.5 mg to every other daily DT restlessness change Seroquel 300mg   1 nightly to improve his sleep which should also help with mood stability.  He called January 15 complaining of insomnia.  Had taken up to 6 Lunesta and 1 night!  He was informed this was dangerous and not to do so again.  He was instructed to increase the quetiapine by an extra half tablet and to increase the Vraylar to 3 mg daily.  It was felt he was having some manic symptoms.  01/26/2020 appointment with the following noted: Admits he had manic sx in January.  Resolved with med changes.  Had to increase the Seroquel to control mania.   Mania had affected work and insomnia.  Tried Vraylar in hopes of being able to reduce eliminate Seroquel.  Was missing work, but not now.  But having problem with FMLA with new supervisor.  Says the episodes have been up to 5 days but the FMLA says leaves would be anticipated for 7-14 days.    Usual Xanax is 2 tablets every 3 days for anxiety. Pretty good.  Job is still really stressful and only  real concern. .  Lasts 3 mos.  Job is very stressful. And volume demands.  Lives in [place]. Married June 2020 and right after fell apart for 3-5 days.  Lost control for a little but normal again now.   Seroquel and lithium work well for sleep and mood.  No problems with reduction in Seroquel in mood and the sexual SE are better.  Used Viagra twice. He reduced the lithium back to 900 mg bc felt like it was all that was necessary.  A little scared to increase the lithium bc hand jerks at times.  Some tremor.  Scares him.  Little anger an impulsivity at this time  but is a little.  Notices he can't drink much with the lithiium.  When drinks he does too much and may not sleep and then has a really negative effect on him. Lithium has helped manage with less anger and impulsivity.  Pleased with the response.  Did not have this level of control before.  Wants to try to get on less meds and his fiancee' is suggesting it.  Feels guilty about being dependent on the meds.  Plan: It was felt [name] was probably not having significant clinical effect and in order to reduce polypharmacy it was discontinued.  05/11/2020 appointment with the following noted: Taking Xanax prn.  Disc FMLA amendment helpful.  Tolerating meds.   Overall doing relatively well.  One period when couldn't work for 2-3 days DT anxiety and irritability.  Otherwise better with change lifestyle with marriage and better routine. No problems off the Vraylar.  The increase Seroquel in the past helped sleep and mood and only needed Lunesta 2-3 times since here.  Work function is typical.  Remains stressful job.  Expects that and handling it a little better.  Better self confidence.   Wife also started working at [employer] too.   Plan: no med changes  08/27/20 appt with following noted: Episode early July after out of lamotrigine for several weeks.  Got in huge fight with wife and missed a week of work.  Otherwise is OK since then.   Out of Armodafinil now for 4-6 weeks.  Managing with mild sleepiness.  Sleep is OK usually with prn. Minimal tremor. Patient reports stable mood and denies depressed or irritable moods usually.  Patient has a little more recent difficulty with anxiety.   Denies appetite disturbance.  Patient reports that energy and motivation have been good.  Patient denies any difficulty with concentration.  Patient denies any suicidal ideation. Plan: no med changes.  Check lithium level.  01/07/21 appt noted: DC from [facility] on Friday 01/05/20.  During week drinks only a  couple and more on weekend.  Plans to drink only weeknds now. Admitted 12/26/20 with emotions of anger and sorry and regret over prior decisions. What put him over was Ex and he has no say over his daughter's care.  Cut his wrists in [place].  Wife caught him in the process.   Gabapentin 300 added for anxiety.  Not sure if med changes helped.  No fatigue. SE more tremor.  Anxiety still present. Don't have sorrow and anger he had before.  I feel better without SI. Slept OK weekend overall except anxiety about the situation.  Taking Lunesta nightly.  ? Re FMLA. Plan: Increase gabapentin 600 mg TID for anxiety and tremor.  01/18/21 appt: Better off gabapentin DT sexual SE about 3 days ago.  Didn't help anxiety or tremor.  Still some anxiety and job stress but handled it OK.   Missed no sig days since here.  Work function not good the first week back. Right now not depressed and not suicidal.  No weapons in the house.  Last SI in hospital. Stress from child's mother and bills can cause him to spiral somewhat.   Taking Lunesta consistently.  Taking Xanax TID and no SE. Plan no med changes  02/13/2021 appt noted: Even keel .  No SI.  Better than last time.  Learned things to help him not get back in the way he was in [place].  Problems with lamotrigine refill. Missed 5-7 days of lamotrigine.  Learned from classes at the hospital.   RTW 2.22.22.  They made a lot of changes there so it's a challenge with fast pace.  Doing well so far.  Louann Sjogren says he's doing OK. Sleep good with Lunesta Seroquel.   Plan: Restart lamotrigine 1/2 of 200 mg tablet for 5 days, then 1 tablet daily for 5 days, then 1 and 1/2 tablets daily for 5 days then 1 twice daily.  10/2021 patient's wife called concerned because he was cutting himself.  Was not a suicide attempt. 04/01/2021 patient evaluated in the ER.  And diagnosed with vertigo  05/17/2021 appointment with the following noted: Vertigo lasted about 2 mos off and on.   Says wife got  info about cutting were gone.  He had scratched himself.  He was not suicidal and did not cut himself.  He is not cutting. Lately overall more stable but is under a bit of stress and is anxious.  Seems like the Xanax is not working as well.  Wonders about switching BZ. Increased work demands.   Sometimes some manic sx with increased talking and hyperactivity including last week.  Depression managed.   Sleeep is good.  Impulsivity managed bc debt.   Plan: Get lithium level today. Continue Seroquel 450mg  nightly to improve his sleep which should also help with mood stability.  Had mania at lower dose and sedation at higher dose.  Polypharmacy in hopes lithium alone will be sufficient at preventing mania. Continue FMLA to have leave for episodic flares of condition intermittent estimated 1-5 days, frequency approximately every 2 mos.  This satisfied his work. Continue lamotrigine 200 mg BID OK trial clonazepam 0.5 mg TID  07/10/2021 phone call: Next visit is 09/16/21. Ean called and said that the Seroquel and Lithium he has been taking is causing weight gain and is making his hands swollen. He would like to discuss  the side effects and what he can do to avoid these from happening.  He was told that these are major and important mood stabilizing medications for him and we cannot make these changes without an appointment.  09/16/2021 appointment with the following noted: Takes clonazepam 0.5 and 1 mg rarely and not a lot of effectiveness so still using Xanax 0.5 mg but takes 3-4 tablets twice daily if having bad day of anxiety.  Denies SE.  Takes Xanax about once daily routinely. Average 2-4 daily. Taking lamotrigine 200 mg BID, Seroquel 450.  Ran out past few days. Doesn't sleep well without meds. Problems with child's mother and huge disagreements over caretaking and child support. Really stressful job, over scrutinized so lives anxiety 7-8/10 and better after work. Concerned about meds and  weight gain. Hungry and thinks it's Seroquel. Plan:  RE:  Seroquel 450mg  nightly Had mania at lower dose and sedation at higher dose. Start  Latuda 30 mg for 1 week then 60 mg daily. Continue FMLA to have leave for episodic flares of condition intermittent estimated 1-5 days, frequency approximately every 2 mos.  This satisfied his work. Continue lamotrigine 200 mg BID trial propranolol 40-60 mg BID prn anxiety.  OK use with Xanax.  Need try to reduce Xanax dose.  Follow-up 6 weeks  10/16/2021 phone call this patient was content fused about how he was supposed to be taking Latuda and Seroquel.  He was taking it appropriately at quetiapine 450 mg nightly and Latuda 60 mg p.m.  With a goal of weaning the Seroquel he was encouraged to reduce that to 300 mg nightly.  11/27/2021 phone call complaining of difficulty sleeping with the lower dose of Seroquel and wanting to increase it back to 450 mg nightly.  This is an increase was agreed.  12/13/2021 phone call: Complaining of poor sleep and more mania. He had not increased the Seroquel to 450 mg daily and was taking Latuda 60 mg and wanted to stop it.  12/30/2021 appointment the following noted:  Didn't like Latuda bc felt he was more manic on it with more spending and credit card debt.   Off the Latuda about 3 weeks. Rather deal with the weight than have unstable mood. Mood is more stable back on Seroquel 450 mg nightly with the lithium 900 mg nightly.  He is not manic currently.  He is needing the Lunesta to sleep.  Admits to taking 2 at a time on occasion. No longer drinks too much. He sees benefit.  [name] says he's well.   Plan: Continue Lithium 900 mg daily. continue  Seroquel 450mg  nightly Had mania at lower dose and sedation at higher dose. For antipsychotic related weight gain we will add metformin 500 mg daily for 1 week then 500 mg twice daily if tolerated since the attempt to switch from Seroquel to Latuda failed.  02/26/22 appt  noted: Asks for Lunesta bc often needs something to help him go to sleep.   Mood is better.  Handled stressors.  Patient reports stable mood and denies depressed or irritable moods.  Patient denies any recent difficulty with anxiety.  Patient denies difficulty with sleep initiation or maintenance. Denies appetite disturbance.  Patient reports that energy and motivation have been good.  Patient denies any difficulty with concentration.  Patient denies any suicidal ideation. Wife OK after pacemaker replacmement. No benefit with metformin 500 BID for weight and willing to increase. No othert SE  12/03/22 appt noted: Meds: Seroquel 450 HS, lithium 900 mg HS, lamotrigine 200 mg   , metformin 500 mg BID (SE diarrhea 1000 mg BID) Uses Xanax 0.5 mg prn anxiety. No SE.  Not using Lunesta BC $25/RX Thinks he went through manic stage with spending and credit cards in April and [place] in September.  He doesn't recognize mania at the time but looking back on it sees it.  Did chapter 39. Mood is OK at present and relieved bc not getting creditor calls. Usual seasonal depression is OK> No SE with meds. He says wife thinks he argues too much.  No excess spending and ok function at work.   Sleep ok with meds. Plan: trial propranolol for anxiety  03/26/23 appt noted: Meds: Seroquel 450 HS, lithium 1200 mg HS, lamotrigine 200 mg .   Uses Xanax 0.5 mg prn anxiety and tremro. No SE.  Lunesta 3mg  HS Alprazolam almost daily before work and at bedtime.    Doesn't feel  like it helps as much as in the past..  it also helps the tremor.  No sleepiness or SE.     Meds as above. Has a very stressful day.  Can have anxiety other times but alprazolam is helpful for tremor and propranolol didn't help tremor.  Increased lithium to 1200  wife said he's more level headed.  Probably tremor is some worse after the increase lithium.  Embarrassing.   To me I always struggle with dep but month Feb is worse with anniversary of  mother's death February 19, 2019.  It still hurts.   Chronic struggles with money.  Been through bankruptcy before but didn't cover everything. SI resolved after psych hosp. Sleep good with combo Lunesta and alprazolam.  Can't sleep without it.   Feels ok with work function.  More reasonable demands at work lately. Plan: Need another level and BMP  emphasized.    06/17/23 appt noted: Stopped lithium 04/18/23. Tremor got unbearable & jerks. Tremor is still there but really mild compared to before. More mood swings off the lithium Psych med: no lithium, quetiapine 450 HS, lamotrigine 200 BID, Lunesta 3 mg HS, Xanax 0.5 mg TID Wife thinks more mood swings off lithium and he notices sad and crying uncontrollably and getting up in middle of night writing at times.  Writing texts to wife in middle of the night. Prior mania with credit card debt and bankruptcy. Plan: Best option start Caplyta 42 mg HS,  reduce Seroquel to 1 tablet at night  10/20/23 appt noted: Meds: Caplyta 42 mg HS, quetiapine 300 mg HS, Lunesta 3 mg HS, Xanax 0.5 mg TID Caplyta made him feel like himself. Crashed when let himself  run out of meds except Caplyta. Missed 5 days of sleep.  Sleep was ok with meds.    Plan: reduce quetiapine 200 HS  02/09/24 appt noted: Med: Caplyta 42 mg HS, quetiapine 200 mg HS, Lunesta 3 mg HS, Xanax 0.5 mg TID Separating and divorcing affecting sleep.  Was out of Lunesta.   Separated yesterday.  Planning on it for a couple of weeks.  Had been coming on for couple mos.   Still very pleased with Caplyta and feels better than with other meds.  Thinks handling sep better than he would have otherwise.   No SI.  No mania except tried to cut himself a month ago.  Not in suicide attempt.  "Will not do that again." Whenever I stick to the program it's ok.   She claims she sees mood swings.     Past Psychiatric Medication Trials:  Depakote paranoia delusions of parasitosis, topiramate, lamotrigine, Seroquel 800  sedating,   Abilify 20 ,  Vraylar 3 mg restless and couldn't sleep,  Latuda 60 mg with Seroquel 300 mg mania lithium 1200 side effect tremor but helped  Gabapentin NR and SE Nuvigil,  Wellbutrin, Viibryd,  trazodone 400,  Sonata, amitriptyline, Ambien, Lunesta worked  propranolol 40-60 mg BID prn anxiety NR Propranolol NR for anxiety. Primidone minimal effect for tremor  Metformin 1000 BID diarrhea no benefit for wt    Review of Systems:  Review of Systems  Constitutional:  Positive for unexpected weight change. Negative for fatigue.  Eyes:  Positive for pain.  Cardiovascular:  Negative for palpitations.       Hypertension  Gastrointestinal:  Negative for nausea.  Neurological:  Positive for tremors. Negative for dizziness and weakness.  Psychiatric/Behavioral:  Positive for sleep disturbance. Negative for agitation, behavioral problems, confusion, decreased concentration, dysphoric mood,  hallucinations, self-injury and suicidal ideas. The patient is nervous/anxious. The patient is not hyperactive.     Medications: I have reviewed the patient's current medications.  Current Outpatient Medications  Medication Sig Dispense Refill   amLODipine (NORVASC) 10 MG tablet Take 10 mg by mouth daily.     atorvastatin (LIPITOR) 40 MG tablet Take 40 mg by mouth daily.     ibuprofen (ADVIL,MOTRIN) 400 MG tablet Take 1 tablet (400 mg total) by mouth every 6 (six) hours as needed for moderate pain. 12 tablet 0   QUEtiapine (SEROQUEL) 200 MG tablet TAKE 1 TABLET AT BEDTIME 90 tablet 1   sildenafil (VIAGRA) 100 MG tablet Take by mouth.     valACYclovir (VALTREX) 500 MG tablet Take 500 mg by mouth. Takes for 5 days     ALPRAZolam (XANAX) 0.5 MG tablet Take 1 tablet (0.5 mg total) by mouth 3 (three) times daily. 90 tablet 2   Eszopiclone 3 MG TABS TAKE 1 TABLET IMMEDIATELY BEFORE BEDTIME 90 tablet 0   lamoTRIgine (LAMICTAL) 200 MG tablet Take 1 tablet (200 mg total) by mouth 2 (two) times daily.  180 tablet 1   lumateperone tosylate (CAPLYTA) 42 MG capsule Take 1 capsule (42 mg total) by mouth daily. 90 capsule 0   No current facility-administered medications for this visit.    Medication Side Effects: None sexual SE are better  Allergies: No Known Allergies  Past Medical History:  Diagnosis Date   Anxiety disorder    Bipolar 1 disorder (HCC)    Hyperlipidemia    Hypertension     History reviewed. No pertinent family history.  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  Tobacco Use   Smoking status: Never   Smokeless tobacco: Never  Substance and Sexual Activity   Alcohol use: Yes   Drug use: No   Sexual activity: Not on file  Other Topics Concern   Not on file  Social History Narrative   Not on file   Social Drivers of Health   Financial Resource Strain: Low Risk  (08/06/2023)   Received from [facility]   Overall Financial Resource Strain (CARDIA)    Difficulty of Paying Living Expenses: Not very hard  Food Insecurity: Food Insecurity Present (08/06/2023)   Received from [facility]   Hunger Vital Sign    Worried About Running Out of Food in the Last Year: Sometimes true    Ran Out of Food in the Last Year: Sometimes true  Transportation Needs: No Transportation Needs (08/06/2023)   Received from [facility] - Transportation    In the past 12 months, has lack of transportation kept you from medical appointments or from getting medications?: No    Lack of Transportation (Non-Medical): No  Physical Activity: Not on file  Stress: Not on file  Social Connections: Not on file  Intimate Partner Violence: Not on file    Past Medical History,  Surgical history, Social history, and Family history were reviewed and updated as appropriate.   Please see review of systems for further details on the patient's review from today.   Objective:   Physical Exam:  There were no vitals  taken for this visit.  Physical Exam Neurological:     Mental Status: He is alert and oriented to person, place, and time.     Cranial Nerves: No dysarthria.  Psychiatric:        Attention and Perception: Attention and perception normal.        Mood and Affect: Mood is anxious. Mood is not depressed. Affect is not labile or tearful.        Speech: Speech normal. Speech is not rapid and pressured, slurred or tangential.        Behavior: Behavior is not slowed. Behavior is cooperative.        Thought Content: Thought content normal. Thought content is not paranoid or delusional. Thought content does not include homicidal or suicidal ideation. Thought content does not include suicidal plan.        Cognition and Memory: Cognition and memory normal.        Judgment: Judgment normal.     Comments: Insight fair to good less mood swings with Caplyta     Lab Review:     Component Value Date/Time   NA 140 05/15/2023 0930   K 4.0 05/15/2023 0930   CL 102 05/15/2023 0930   CO2 24 05/15/2023 0930   GLUCOSE 93 05/15/2023 0930   GLUCOSE 107 (H) 10/06/2019 1534   BUN 6 05/15/2023 0930   CREATININE 1.34 (H) 05/15/2023 0930   CALCIUM 9.9 05/15/2023 0930   PROT 8.7 (H) 07/20/2015 1129   ALBUMIN 4.9 07/20/2015 1129   AST 37 07/20/2015 1129   ALT 51 07/20/2015 1129   ALKPHOS 79 07/20/2015 1129   BILITOT 0.6 07/20/2015 1129   GFRNONAA 59 (L) 10/26/2020 0833   GFRAA 68 10/26/2020 0833       Component Value Date/Time   WBC 3.4 (L) 09/15/2016 0500   RBC 4.37 (L) 09/15/2016 0500   HGB 13.3 09/15/2016 0500   HCT 38.7 (L) 09/15/2016 0500   PLT 242 09/15/2016 0500   MCV 88.6 09/15/2016 0500   MCH 30.4 09/15/2016 0500   MCHC 34.3 09/15/2016 0500   RDW 13.1  09/15/2016 0500   LYMPHSABS 2.3 07/20/2015 1129   MONOABS 0.2 07/20/2015 1129   EOSABS 0.1 07/20/2015 1129   BASOSABS 0.0 07/20/2015 1129    Lithium Lvl  Date Value Ref Range Status  05/15/2023 0.4 (L) 0.5 - 1.2 mmol/L Final    Comment:    A concentration of 0.5-0.8 mmol/L is advised for long-term use; concentrations of up to 1.2 mmol/L may be necessary during acute treatment.                                  Detection Limit = 0.1                           <0.1 indicates None Detected    05/15/23 lithium level 0.4 on 1200 dialy. CR 1.34 (stable 4 years), Ca 9.9   06/17/22 Cr 1.5, TSH 1.85, lipids done by PCP   No results found for: "PHENYTOIN", "PHENOBARB", "VALPROATE", "CBMZ"   .res Assessment: Plan:    Bipolar I disorder, most recent episode (or current) manic (HCC) - Plan: lamoTRIgine (LAMICTAL) 200 MG tablet, lumateperone tosylate (CAPLYTA) 42 MG capsule  Generalized anxiety disorder - Plan: ALPRAZolam (XANAX) 0.5 MG tablet  Insomnia due to mental condition - Plan: Eszopiclone 3 MG TABS  Drug-induced weight gain  Obstructive sleep apnea  Lithium-induced tremor  Lithium use   30 min video face to face time with patient was spent on counseling and coordination of care. We discussed his diagnoses and answered questions re: bipolar dx, meds, SE. he had received additional parent benefit and mood consistency with the addition of the lithium.   But DT mania in 2023 he's increased lithium 1200 mg daily with benefit but more tremor..   He's decided lithium intolerable and stopped early June 2024 resulting in more mood swing.s  Much better mood with Caplyta.   Didn't do well off lithium on just other meds.  Discussed potential metabolic side effects associated with atypical antipsychotics, as well as potential risk for movement side effects. Advised pt to contact office if movement side effects occur.   We discussed the short-term risks associated with benzodiazepines  including sedation and increased fall risk among others.  Discussed long-term side effect risk including dependence, potential withdrawal symptoms, and the potential eventual dose-related risk of dementia.  But recent studies from 2020 dispute this association between benzodiazepines and dementia risk. Newer studies in 2020 do not support an association with dementia.  But did discuss risk of affecting memory in daytime.  Counseled patient regarding potential benefits, risks, and side effects of Lamictal to include potential risk of Stevens-Johnson syndrome. Advised patient to stop taking Lamictal and contact office immediately if rash develops and to seek urgent medical attention if rash is severe and/or spreading quickly.  Also emphasized the need for conisistency with lamotrigine dosing otherwise there is an increased risk of rash.  Disc potential effect on B vitamin levels.   Disc increased rash risk with inconsistency. Disc risk DDI with primidone and if gets more dep or anxious call and will need to increase lamotrigine.  Watch alcohol.  Hx some excess.  Denies a problem now.  Continue FMLA to have leave for episodic flares of condition intermittent estimated 1-5 days, frequency approximately every 2 mos.  This satisfied his work and needs to be renewed.    Continue lamotrigine 200 mg BID  OK use with Xanax.    He can take Lunesta 3 mg nightly but he cannot increase the dose above that.  Options caplyta or Tegretol best ones.  Disc SE continue Caplyta 42 mg HS, very good response    Defer reduce Seroquel to 200 mg tablet at night.  Will try to reduce to lowest possible dose after settled with divorce.  Disc the off-label use of N-Acetylcysteine at 1200 mg daily to help with mild cognitive problems.  It can be combined with a B-complex vitamin as the B-12 and folate have been shown to sometimes enhance the effect. This is for complaints of forgetfulness.  Discussed the risks and in his  case necessity of polypharmacy.  He is agreeable to continue the current meds.  We are gradually improving this.  Residual tremor off lithium.  Option retry propranolol or primidone for it.  FU 3 mos  Meredith Staggers, MD, DFAPA   Please see After Visit Summary for patient specific instructions.  No future appointments.     No orders of the defined types were placed in this encounter.       -------------------------------

## 2024-02-19 ENCOUNTER — Other Ambulatory Visit: Payer: Self-pay | Admitting: Psychiatry

## 2024-02-19 DIAGNOSIS — F3110 Bipolar disorder, current episode manic without psychotic features, unspecified: Secondary | ICD-10-CM

## 2024-02-19 NOTE — Telephone Encounter (Signed)
 Call and RS next available.

## 2024-02-24 NOTE — Telephone Encounter (Signed)
 Apt 6/16. Requesting Caplyta 42 mg #30 to [facility] [address] [place]  Do not send to [facility] due to cost

## 2024-03-04 ENCOUNTER — Other Ambulatory Visit: Payer: Self-pay | Admitting: Psychiatry

## 2024-03-04 DIAGNOSIS — F3110 Bipolar disorder, current episode manic without psychotic features, unspecified: Secondary | ICD-10-CM

## 2024-04-08 ENCOUNTER — Other Ambulatory Visit: Payer: Self-pay | Admitting: Psychiatry

## 2024-04-08 DIAGNOSIS — F3110 Bipolar disorder, current episode manic without psychotic features, unspecified: Secondary | ICD-10-CM

## 2024-05-03 ENCOUNTER — Telehealth: Admitting: Psychiatry

## 2024-05-03 ENCOUNTER — Encounter: Payer: Self-pay | Admitting: Psychiatry

## 2024-05-03 DIAGNOSIS — F411 Generalized anxiety disorder: Secondary | ICD-10-CM

## 2024-05-03 DIAGNOSIS — R635 Abnormal weight gain: Secondary | ICD-10-CM

## 2024-05-03 DIAGNOSIS — F3110 Bipolar disorder, current episode manic without psychotic features, unspecified: Secondary | ICD-10-CM

## 2024-05-03 DIAGNOSIS — G4733 Obstructive sleep apnea (adult) (pediatric): Secondary | ICD-10-CM

## 2024-05-03 DIAGNOSIS — G251 Drug-induced tremor: Secondary | ICD-10-CM

## 2024-05-03 DIAGNOSIS — F5105 Insomnia due to other mental disorder: Secondary | ICD-10-CM

## 2024-05-03 DIAGNOSIS — Z79899 Other long term (current) drug therapy: Secondary | ICD-10-CM

## 2024-05-03 DIAGNOSIS — T50905A Adverse effect of unspecified drugs, medicaments and biological substances, initial encounter: Secondary | ICD-10-CM

## 2024-05-03 MED ORDER — ESZOPICLONE 3 MG PO TABS: ORAL_TABLET | ORAL | 1 refills | Status: DC

## 2024-05-03 MED ORDER — ALPRAZOLAM 0.5 MG PO TABS: 0.5000 mg | ORAL_TABLET | Freq: Three times a day (TID) | ORAL | 5 refills | Status: DC

## 2024-05-03 MED ORDER — LUMATEPERONE TOSYLATE 42 MG PO CAPS: 42.0000 mg | ORAL_CAPSULE | Freq: Every day | ORAL | 5 refills | Status: DC

## 2024-05-03 MED ORDER — LAMOTRIGINE 200 MG PO TABS: 200.0000 mg | ORAL_TABLET | Freq: Two times a day (BID) | ORAL | 1 refills | Status: AC

## 2024-05-03 NOTE — Progress Notes (Signed)
 Michael Mclean [id number] 10/20/1971 53 y.o.  Video Visit via My Chart  I connected with pt by My Chart video and verified that I am speaking with the correct person using two identifiers.   I discussed the limitations, risks, security and privacy concerns of performing an evaluation and management service by My Chart  and the availability of in person appointments. I also discussed with the patient that there may be a patient responsible charge related to this service. The patient expressed understanding and agreed to proceed.  I discussed the assessment and treatment plan with the patient. The patient was provided an opportunity to ask questions and all were answered. The patient agreed with the plan and demonstrated an understanding of the instructions.   The patient was advised to call back or seek an in-person evaluation if the symptoms worsen or if the condition fails to improve as anticipated.  I provided 30 minutes of video time during this encounter.  The patient was located at home and the provider was located office.  Session started at 130-200  Subjective:   Patient ID:  Michael Mclean is a 53 y.o. (DOB Oct 03, 1971) male.  Chief Complaint:  Chief Complaint  Patient presents with   Follow-up    Mood anxiety  and sleep    Medication Refill Pertinent negatives include no fatigue, nausea or weakness.  Anxiety Symptoms include nervous/anxious behavior. Patient reports no confusion, decreased concentration, dizziness, nausea, palpitations or suicidal ideas.    Depression        Associated symptoms include no decreased concentration, no fatigue and no suicidal ideas.  Past medical history includes anxiety.    Michael Mclean is due for follow-up of bipolar disorder.  seen August 11, 2019.  Due to erectile dysfunction Seroquel  XR was reduced to 600 mg daily from 800 mg daily.  Also lithium  level and labs were ordered.  seen October 06, 2019.  Serum lithium  level, TSH,  BMP.  Check it.  He wants to go now this afternoon.  It will not be a trough but it is hard for him to get off of work.  We will expect a lower than usual blood level. Start Vraylar  1.5 mg 1 capsule daily Reduce Seroquel  XR to 1 1/2 nightly for 1 week then 1 nightly  seen November 17, 2019.  The following changes were noted: Reduce Vraylar  1.5 mg to every other daily DT restlessness change Seroquel  300mg   1 nightly to improve his sleep which should also help with mood stability.  He called January 15 complaining of insomnia.  Had taken up to 6 Lunesta  and 1 night!  He was informed this was dangerous and not to do so again.  He was instructed to increase the quetiapine  by an extra half tablet and to increase the Vraylar  to 3 mg daily.  It was felt he was having some manic symptoms.  01/26/2020 appointment with the following noted: Admits he had manic sx in January.  Resolved with med changes.  Had to increase the Seroquel  to control mania.   Mania had affected work and insomnia.  Tried Vraylar  in hopes of being able to reduce eliminate Seroquel .  Was missing work, but not now.  But having problem with FMLA with new supervisor.  Says the episodes have been up to 5 days but the FMLA says leaves would be anticipated for 7-14 days.    Usual Xanax  is 2 tablets every 3 days for anxiety. Pretty good.  Job is still really stressful and  only real concern. .  Lasts 3 mos.  Job is very stressful. And volume demands.  Lives in [place]. Married June 2020 and right after fell apart for 3-5 days.  Lost control for a little but normal again now.   Seroquel  and lithium  work well for sleep and mood.  No problems with reduction in Seroquel  in mood and the sexual SE are better.  Used Viagra twice. He reduced the lithium  back to 900 mg bc felt like it was all that was necessary.  A little scared to increase the lithium  bc hand jerks at times.  Some tremor.  Scares him.  Little anger an impulsivity at this time but  is a little.  Notices he can't drink much with the lithiium.  When drinks he does too much and may not sleep and then has a really negative effect on him. Lithium  has helped manage with less anger and impulsivity.  Pleased with the response.  Did not have this level of control before.  Wants to try to get on less meds and his fiancee' is suggesting it.  Feels guilty about being dependent on the meds.  Plan: It was felt Vraylar  was probably not having significant clinical effect and in order to reduce polypharmacy it was discontinued.  05/11/2020 appointment with the following noted: Taking Xanax  prn.  Disc FMLA amendment helpful.  Tolerating meds.   Overall doing relatively well.  One period when couldn't work for 2-3 days DT anxiety and irritability.  Otherwise better with change lifestyle with marriage and better routine. No problems off the Vraylar .  The increase Seroquel  in the past helped sleep and mood and only needed Lunesta  2-3 times since here.  Work function is typical.  Remains stressful job.  Expects that and handling it a little better.  Better self confidence.   Wife also started working at [employer] too.   Plan: no med changes  08/27/20 appt with following noted: Episode early July after out of lamotrigine  for several weeks.  Got in huge fight with wife and missed a week of work.  Otherwise is OK since then.   Out of Armodafinil  now for 4-6 weeks.  Managing with mild sleepiness.  Sleep is OK usually with prn. Minimal tremor. Patient reports stable mood and denies depressed or irritable moods usually.  Patient has a little more recent difficulty with anxiety.   Denies appetite disturbance.  Patient reports that energy and motivation have been good.  Patient denies any difficulty with concentration.  Patient denies any suicidal ideation. Plan: no med changes.  Check lithium  level.  01/07/21 appt noted: DC from hospital [facility] on Friday 01/05/20.  During week drinks only a  couple and more on weekend.  Plans to drink only weeknds now. Admitted 12/26/20 with emotions of anger and sorry and regret over prior decisions. What put him over was Ex and he has no say over his daughter's care.  Cut his wrists in SA.  Wife caught him in the process.   Gabapentin 300 added for anxiety.  Not sure if med changes helped.  No fatigue. SE more tremor.  Anxiety still present. Don't have sorrow and anger he had before.  I feel better without SI. Slept OK weekend overall except anxiety about the situation.  Taking Lunesta  nightly.  ? Re FMLA. Plan: Increase gabapentin 600 mg TID for anxiety and tremor.  01/18/21 appt: Better off gabapentin DT sexual SE about 3 days ago.  Didn't help anxiety or  tremor. Still some anxiety and job stress but handled it OK.   Missed no sig days since here.  Work function not good the first week back. Right now not depressed and not suicidal.  No weapons in the house.  Last SI in hospital. Stress from child's mother and bills can cause him to spiral somewhat.   Taking Lunesta  consistently.  Taking Xanax  TID and no SE. Plan no med changes  02/13/2021 appt noted: Even keel .  No SI.  Better than last time.  Learned things to help him not get back in the way he was in [place].  Problems with lamotrigine  refill. Missed 5-7 days of lamotrigine .  Learned from classes at the hospital.   RTW 2.22.22.  They made a lot of changes there so it's a challenge with fast pace.  Doing well so far.  Willene Harper says he's doing OK. Sleep good with Lunesta  Seroquel .   Plan: Restart lamotrigine  1/2 of 200 mg tablet for 5 days, then 1 tablet daily for 5 days, then 1 and 1/2 tablets daily for 5 days then 1 twice daily.  10/2021 patient's wife called concerned because he was cutting himself.  Was not a suicide attempt. 04/01/2021 patient evaluated in the ER.  And diagnosed with vertigo  05/17/2021 appointment with the following noted: Vertigo lasted about 2 mos off and on.   Says wife got  info about cutting were gone.  He had scratched himself.  He was not suicidal and did not cut himself.  He is not cutting. Lately overall more stable but is under a bit of stress and is anxious.  Seems like the Xanax  is not working as well.  Wonders about switching BZ. Increased work demands.   Sometimes some manic sx with increased talking and hyperactivity including last week.  Depression managed.   Sleeep is good.  Impulsivity managed bc debt.   Plan: Get lithium  level today. Continue Seroquel  450mg  nightly to improve his sleep which should also help with mood stability.  Had mania at lower dose and sedation at higher dose.  Polypharmacy in hopes lithium  alone will be sufficient at preventing mania. Continue FMLA to have leave for episodic flares of condition intermittent estimated 1-5 days, frequency approximately every 2 mos.  This satisfied his work. Continue lamotrigine  200 mg BID OK trial clonazepam  0.5 mg TID  07/10/2021 phone call: Next visit is 09/16/21. Horris called and said that the Seroquel  and Lithium  he has been taking is causing weight gain and is making his hands swollen. He would like to discuss  the side effects and what he can do to avoid these from happening.  He was told that these are major and important mood stabilizing medications for him and we cannot make these changes without an appointment.  09/16/2021 appointment with the following noted: Takes clonazepam  0.5 and 1 mg rarely and not a lot of effectiveness so still using Xanax  0.5 mg but takes 3-4 tablets twice daily if having bad day of anxiety.  Denies SE.  Takes Xanax  about once daily routinely. Average 2-4 daily. Taking lamotrigine  200 mg BID, Seroquel  450.  Ran out past few days. Doesn't sleep well without meds. Problems with child's mother and huge disagreements over caretaking and child support. Really stressful job, over scrutinized so lives anxiety 7-8/10 and better after work. Concerned about meds and  weight gain. Hungry and thinks it's Seroquel . Plan:  RE:  Seroquel  450mg  nightly Had mania at lower dose and sedation at higher dose.  Start Latuda  30 mg for 1 week then 60 mg daily. Continue FMLA to have leave for episodic flares of condition intermittent estimated 1-5 days, frequency approximately every 2 mos.  This satisfied his work. Continue lamotrigine  200 mg BID trial propranolol  40-60 mg BID prn anxiety.  OK use with Xanax .  Need try to reduce Xanax  dose.  Follow-up 6 weeks  10/16/2021 phone call this patient was content fused about how he was supposed to be taking Latuda  and Seroquel .  He was taking it appropriately at quetiapine  450 mg nightly and Latuda  60 mg p.m.  With a goal of weaning the Seroquel  he was encouraged to reduce that to 300 mg nightly.  11/27/2021 phone call complaining of difficulty sleeping with the lower dose of Seroquel  and wanting to increase it back to 450 mg nightly.  This is an increase was agreed.  12/13/2021 phone call: Complaining of poor sleep and more mania. He had not increased the Seroquel  to 450 mg daily and was taking Latuda  60 mg and wanted to stop it.  12/30/2021 appointment the following noted:  Didn't like Latuda  bc felt he was more manic on it with more spending and credit card debt.   Off the Latuda  about 3 weeks. Rather deal with the weight than have unstable mood. Mood is more stable back on Seroquel  450 mg nightly with the lithium  900 mg nightly.  He is not manic currently.  He is needing the Lunesta  to sleep.  Admits to taking 2 at a time on occasion. No longer drinks too much. He sees benefit.  [name] says he's well.   Plan: Continue Lithium  900 mg daily. continue  Seroquel  450mg  nightly Had mania at lower dose and sedation at higher dose. For antipsychotic related weight gain we will add metformin  500 mg daily for 1 week then 500 mg twice daily if tolerated since the attempt to switch from Seroquel  to Latuda  failed.  02/26/22 appt  noted: Asks for Lunesta  bc often needs something to help him go to sleep.   Mood is better.  Handled stressors.  Patient reports stable mood and denies depressed or irritable moods.  Patient denies any recent difficulty with anxiety.  Patient denies difficulty with sleep initiation or maintenance. Denies appetite disturbance.  Patient reports that energy and motivation have been good.  Patient denies any difficulty with concentration.  Patient denies any suicidal ideation. Wife OK after pacemaker replacmement. No benefit with metformin  500 BID for weight and willing to increase. No othert SE  12/03/22 appt noted: Meds: Seroquel  450 HS, lithium  900 mg HS, lamotrigine  200 mg   , metformin  500 mg BID (SE diarrhea 1000 mg BID) Uses Xanax  0.5 mg prn anxiety. No SE.  Not using Lunesta  BC $25/RX Thinks he went through manic stage with spending and credit cards in April and [place] in September.  He doesn't recognize mania at the time but looking back on it sees it.  Did chapter 25. Mood is OK at present and relieved bc not getting creditor calls. Usual seasonal depression is OK> No SE with meds. He says wife thinks he argues too much.  No excess spending and ok function at work.   Sleep ok with meds. Plan: trial propranolol  for anxiety  03/26/23 appt noted: Meds: Seroquel  450 HS, lithium  1200 mg HS, lamotrigine  200 mg .   Uses Xanax  0.5 mg prn anxiety and tremro. No SE.  Lunesta  3mg  HS Alprazolam  almost daily before work and at bedtime.    Doesn't  feel like it helps as much as in the past..  it also helps the tremor.  No sleepiness or SE.     Meds as above. Has a very stressful day.  Can have anxiety other times but alprazolam  is helpful for tremor and propranolol  didn't help tremor.  Increased lithium  to 1200  wife said he's more level headed.  Probably tremor is some worse after the increase lithium .  Embarrassing.   To me I always struggle with dep but month Feb is worse with anniversary of  mother's death May 19, 2019.  It still hurts.   Chronic struggles with money.  Been through bankruptcy before but didn't cover everything. SI resolved after psych hosp. Sleep good with combo Lunesta  and alprazolam .  Can't sleep without it.   Feels ok with work function.  More reasonable demands at work lately. Plan: Need another level and BMP  emphasized.    06/17/23 appt noted: Stopped lithium  04/18/23. Tremor got unbearable & jerks. Tremor is still there but really mild compared to before. More mood swings off the lithium  Psych med: no lithium , quetiapine  450 HS, lamotrigine  200 BID, Lunesta  3 mg HS, Xanax  0.5 mg TID Wife thinks more mood swings off lithium  and he notices sad and crying uncontrollably and getting up in middle of night writing at times.  Writing texts to wife in middle of the night. Prior mania with credit card debt and bankruptcy. Plan: Best option start Caplyta  42 mg HS,  reduce Seroquel  to 1 tablet at night  10/20/23 appt noted: Meds: Caplyta  42 mg HS, quetiapine  300 mg HS, Lunesta  3 mg HS, Xanax  0.5 mg TID Caplyta  made him feel like himself. Crashed when let himself  run out of meds except Caplyta . Missed 5 days of sleep.  Sleep was ok with meds.    Plan: reduce quetiapine  200 HS  02/09/24 appt noted: Med: Caplyta  42 mg HS, quetiapine  200 mg HS, Lunesta  3 mg HS, Xanax  0.5 mg TID Separating and divorcing affecting sleep.  Was out of Lunesta .   Separated yesterday.  Planning on it for a couple of weeks.  Had been coming on for couple mos.   Still very pleased with Caplyta  and feels better than with other meds.  Thinks handling sep better than he would have otherwise.   No SI.  No mania except tried to cut himself a month ago.  Not in suicide attempt.  Will not do that again. Whenever I stick to the program it's ok.   She claims she sees mood swings.    05/03/24 appt noted:  Med: Caplyta  42 mg HS, quetiapine  200 mg HS, Lunesta  3 mg HS, Xanax  0.5 mg TID Reconciled with wife  and back together with her 3 weeks.   Overall not markedly depressed.  Not getting as sad as in the past.  Not getting into despair like before.  Anxiety still there with good reason.   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 53 yo.   Praying as a family more.   Since back on Lunesta  after out 3 weeks.  With it is better.      Past Psychiatric Medication Trials:  Depakote paranoia delusions of parasitosis, topiramate , lamotrigine , Seroquel  800 sedating,   Abilify 20 ,  Vraylar  3 mg restless and couldn't sleep,  Latuda  60 mg with Seroquel  300 mg mania lithium  1200 side effect tremor but helped  Gabapentin NR and SE Nuvigil ,  Wellbutrin , Viibryd,  trazodone 400,  Sonata, amitriptyline,  Ambien , Lunesta  worked  propranolol  40-60 mg BID prn anxiety NR Propranolol  NR for anxiety. Primidone  minimal effect for tremor  Metformin  1000 BID diarrhea no benefit for wt    Review of Systems:  Review of Systems  Constitutional:  Positive for unexpected weight change. Negative for fatigue.  Cardiovascular:  Negative for palpitations.       Hypertension  Gastrointestinal:  Negative for nausea.  Neurological:  Positive for tremors. Negative for dizziness and weakness.  Psychiatric/Behavioral:  Positive for sleep disturbance. Negative for agitation, behavioral problems, confusion, decreased concentration, dysphoric mood, hallucinations, self-injury and suicidal ideas. The patient is nervous/anxious. The patient is not hyperactive.     Medications: I have reviewed the patient's current medications.  Current Outpatient Medications  Medication Sig Dispense Refill   amLODipine (NORVASC) 10 MG tablet Take 10 mg by mouth daily.     atorvastatin  (LIPITOR) 40 MG tablet Take 40 mg by mouth daily.     ibuprofen  (ADVIL ,MOTRIN ) 400 MG tablet Take 1 tablet (400 mg total) by mouth every 6 (six) hours as needed for moderate pain. 12 tablet 0   QUEtiapine  (SEROQUEL ) 200 MG tablet TAKE 1  TABLET AT BEDTIME 90 tablet 1   sildenafil (VIAGRA) 100 MG tablet Take by mouth.     valACYclovir (VALTREX) 500 MG tablet Take 500 mg by mouth. Takes for 5 days     ALPRAZolam  (XANAX ) 0.5 MG tablet Take 1 tablet (0.5 mg total) by mouth 3 (three) times daily. 90 tablet 5   Eszopiclone  3 MG TABS TAKE 1 TABLET IMMEDIATELY BEFORE BEDTIME 90 tablet 1   lamoTRIgine  (LAMICTAL ) 200 MG tablet Take 1 tablet (200 mg total) by mouth 2 (two) times daily. 180 tablet 1   lumateperone  tosylate (CAPLYTA ) 42 MG capsule Take 1 capsule (42 mg total) by mouth daily. 30 capsule 5   No current facility-administered medications for this visit.    Medication Side Effects: None sexual SE are better  Allergies: No Known Allergies  Past Medical History:  Diagnosis Date   Anxiety disorder    Bipolar 1 disorder (HCC)    Hyperlipidemia    Hypertension     History reviewed. No pertinent family history.  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  Tobacco Use   Smoking status: Never   Smokeless tobacco: Never  Substance and Sexual Activity   Alcohol use: Yes   Drug use: No   Sexual activity: Not on file  Other Topics Concern   Not on file  Social History Narrative   Not on file   Social Drivers of Health   Financial Resource Strain: Low Risk  (08/06/2023)   Received from [facility]   Overall Financial Resource Strain (CARDIA)    Difficulty of Paying Living Expenses: Not very hard  Food Insecurity: Food Insecurity Present (08/06/2023)   Received from [facility]   Hunger Vital Sign    Within the past 12 months, you worried that your food would run out before you got the money to buy more.: Sometimes true    Within  the past 12 months, the food you bought just didn't last and you didn't have money to get more.: Sometimes true  Transportation Needs: No Transportation Needs (08/06/2023)   Received from [facility] - Transportation    In the past 12 months, has lack of transportation kept you from medical appointments or from getting medications?: No    Lack of Transportation (Non-Medical): No  Physical Activity: Not on file  Stress: Not on file  Social Connections: Not on file  Intimate Partner Violence: Not on file    Past Medical History, Surgical history, Social history, and Family history were reviewed and updated as appropriate.   Please see review of systems for further details on the patient's review from today.   Objective:   Physical Exam:  There were no vitals taken for this visit.  Physical Exam  Neurological:     Mental Status: He is alert and oriented to person, place, and time.     Cranial Nerves: No dysarthria.   Psychiatric:        Attention and Perception: Attention and perception normal.        Mood and Affect: Mood is anxious. Mood is not depressed. Affect is not labile or tearful.        Speech: Speech normal. Speech is not rapid and pressured, slurred or tangential.        Behavior: Behavior is not slowed. Behavior is cooperative.        Thought Content: Thought content normal. Thought content is not paranoid or delusional. Thought content does not include homicidal or suicidal ideation. Thought content does not include suicidal plan.        Cognition and Memory: Cognition and memory normal.        Judgment: Judgment normal.     Comments: Insight fair to good less mood swings with Caplyta  and less dep     Lab Review:     Component Value Date/Time   NA 140 05/15/2023 0930   K 4.0 05/15/2023 0930   CL 102 05/15/2023 0930   CO2 24 05/15/2023 0930   GLUCOSE 93 05/15/2023 0930   GLUCOSE 107 (H) 10/06/2019 1534   BUN 6 05/15/2023 0930    CREATININE 1.34 (H) 05/15/2023 0930   CALCIUM  9.9 05/15/2023 0930   PROT 8.7 (H) 07/20/2015 1129   ALBUMIN 4.9 07/20/2015 1129   AST 37 07/20/2015 1129   ALT 51 07/20/2015 1129   ALKPHOS 79 07/20/2015 1129   BILITOT 0.6 07/20/2015 1129   GFRNONAA 59 (L) 10/26/2020 0833   GFRAA 68 10/26/2020 0833       Component Value Date/Time   WBC 3.4 (L) 09/15/2016 0500   RBC 4.37 (L) 09/15/2016 0500   HGB 13.3 09/15/2016 0500   HCT 38.7 (L) 09/15/2016 0500   PLT 242 09/15/2016 0500   MCV 88.6 09/15/2016 0500   MCH 30.4 09/15/2016 0500   MCHC 34.3 09/15/2016 0500   RDW 13.1 09/15/2016 0500   LYMPHSABS 2.3 07/20/2015 1129   MONOABS 0.2 07/20/2015 1129   EOSABS 0.1 07/20/2015 1129   BASOSABS 0.0 07/20/2015 1129    Lithium  Lvl  Date Value Ref Range Status  05/15/2023 0.4 (L) 0.5 - 1.2 mmol/L Final    Comment:    A concentration of 0.5-0.8 mmol/L is advised for long-term use; concentrations of up to 1.2 mmol/L may be necessary during acute treatment.                                  Detection Limit = 0.1                           <  0.1 indicates None Detected    05/15/23 lithium  level 0.4 on 1200 dialy. CR 1.34 (stable 4 years), Ca 9.9   06/17/22 Cr 1.5, TSH 1.85, lipids done by PCP   No results found for: PHENYTOIN, PHENOBARB, VALPROATE, CBMZ   .res Assessment: Plan:    Bipolar I disorder, most recent episode (or current) manic (HCC) - Plan: lumateperone  tosylate (CAPLYTA ) 42 MG capsule, lamoTRIgine  (LAMICTAL ) 200 MG tablet  Generalized anxiety disorder - Plan: ALPRAZolam  (XANAX ) 0.5 MG tablet  Insomnia due to mental condition - Plan: Eszopiclone  3 MG TABS  Drug-induced weight gain  Obstructive sleep apnea  Lithium -induced tremor  Lithium  use   30 min video face to face time with patient was spent on counseling and coordination of care. We discussed his diagnoses and answered questions re: bipolar dx, meds, SE. he had received additional parent benefit and mood  consistency with the addition of the lithium .   But DT mania in 2023 he's increased lithium  1200 mg daily with benefit but more tremor..   He's decided lithium  intolerable and stopped early June 2024 resulting in more mood swing.s  Much better mood with Caplyta .   Didn't do well off lithium  on just other meds.  Discussed potential metabolic side effects associated with atypical antipsychotics, as well as potential risk for movement side effects. Advised pt to contact office if movement side effects occur.   We discussed the short-term risks associated with benzodiazepines including sedation and increased fall risk among others.  Discussed long-term side effect risk including dependence, potential withdrawal symptoms, and the potential eventual dose-related risk of dementia.  But recent studies from 2020 dispute this association between benzodiazepines and dementia risk. Newer studies in 2020 do not support an association with dementia.  But did discuss risk of affecting memory in daytime.  Counseled patient regarding potential benefits, risks, and side effects of Lamictal  to include potential risk of Stevens-Johnson syndrome. Advised patient to stop taking Lamictal  and contact office immediately if rash develops and to seek urgent medical attention if rash is severe and/or spreading quickly.  Also emphasized the need for conisistency with lamotrigine  dosing otherwise there is an increased risk of rash.  Disc potential effect on B vitamin levels.   Disc increased rash risk with inconsistency. Disc risk DDI with primidone  and if gets more dep or anxious call and will need to increase lamotrigine .  Watch alcohol.  Hx some excess.  Denies a problem now.  Continue FMLA to have leave for episodic flares of condition intermittent estimated 1-5 days, frequency approximately every 2 mos.  This satisfied his work and needs to be renewed.    Continue lamotrigine  200 mg BID  OK use with Xanax .    He can  take Lunesta  3 mg nightly but he cannot increase the dose above that.  Options caplyta  or Tegretol best ones.  Disc SE continue Caplyta  42 mg HS, very good response    Try to reduce and stop Seroquel  wean over a vouple of weeks. .    Disc the off-label use of N-Acetylcysteine at 1200 mg daily to help with mild cognitive problems.  It can be combined with a B-complex vitamin as the B-12 and folate have been shown to sometimes enhance the effect. This is for complaints of forgetfulness.  Discussed the risks and in his case necessity of polypharmacy.  He is agreeable to continue the current meds.  We are gradually improving this.  Residual tremor off lithium .  Option retry propranolol  or primidone  for  it.  FU 3 mos  Nori Beat, MD, DFAPA   Please see After Visit Summary for patient specific instructions.  No future appointments.     No orders of the defined types were placed in this encounter.       -------------------------------

## 2024-05-16 ENCOUNTER — Telehealth: Payer: Self-pay | Admitting: Psychiatry

## 2024-05-16 DIAGNOSIS — F3110 Bipolar disorder, current episode manic without psychotic features, unspecified: Secondary | ICD-10-CM

## 2024-05-16 NOTE — Telephone Encounter (Signed)
 LVM to [facility]

## 2024-05-16 NOTE — Telephone Encounter (Signed)
 Pt called in at 1:10p to say he threw away all of the Seroquel  he had and now he needs it because he can't sleep, hadnt slept in 3 days.[facility]   [facility] [address] [place]

## 2024-05-16 NOTE — Telephone Encounter (Signed)
 Next visit is 05/03/24. Dellis left a message that he has stopped taking the Seroquel  and said he went cold [place]. Now he is not sleeping with with the Lunesta  and decreasing the dosage of Seroquel . His phone number [phone].

## 2024-05-16 NOTE — Telephone Encounter (Signed)
 From 6/17 visit:  Try to reduce and stop Seroquel , wean over a couple of weeks.

## 2024-05-17 MED ORDER — QUETIAPINE FUMARATE 200 MG PO TABS: 200.0000 mg | ORAL_TABLET | Freq: Every day | ORAL | 0 refills | Status: DC

## 2024-05-17 NOTE — Telephone Encounter (Signed)
 RF [facility]eroquel  [facility] [place]. Patient was supposed to wean and he stopped cold [place].

## 2024-05-17 NOTE — Addendum Note (Signed)
 Addended by: CON BRISTLE T on: 05/17/2024 08:44 AM   Modules accepted: Orders

## 2024-06-20 ENCOUNTER — Telehealth: Payer: Self-pay

## 2024-06-20 ENCOUNTER — Other Ambulatory Visit: Payer: Self-pay | Admitting: Psychiatry

## 2024-06-20 DIAGNOSIS — F3110 Bipolar disorder, current episode manic without psychotic features, unspecified: Secondary | ICD-10-CM

## 2024-06-20 MED ORDER — QUETIAPINE FUMARATE 300 MG PO TABS: 300.0000 mg | ORAL_TABLET | Freq: Every day | ORAL | 0 refills | Status: DC

## 2024-06-20 NOTE — Telephone Encounter (Addendum)
 From 6/17 visit:  Try to reduce and stop Seroquel  wean over a vouple of weeks.   Received a pharmacy interface RF request for Seroquel  300 mg. Called patient to see if he has weaned and he has not. He reports taking Caplyta  42, which he feels is working well.  Takes Lunesta  3 mg, Xanax  0.5, and Seroquel  200 mg at bedtime, but he doesn't stay asleep. He feels like the 300 mg Seroquel  was helpful. Reviewed sleep hygiene with him. He reports being so sleepy during the day from not sleeping all night and he will drink coffee. Told him to cut caffeine off at 2:00. He does watch TV before bed, but he said it is usually something he doesn't really engage in.   Pt called back to report he didn't have Caplyta  or Seroquel . Called pharmacy to see when he last got fills on both medications. Last filled Caplyta  on 6/5 for 30 day supply, Last filled Seroquel  7/1 for 30-day supply. He has RF available on both. He is still asking to increase Seroquel  dose back to 300 mg.  Is asking to send Rx to [facility].

## 2024-06-20 NOTE — Telephone Encounter (Signed)
 Try to reduce and stop Seroquel  wean over a couple of weeks.   Is he still taking?

## 2024-06-20 NOTE — Telephone Encounter (Signed)
 Pt has RF of Caplyta . Rx for Seroquel  300 mg sent.

## 2024-06-20 NOTE — Telephone Encounter (Signed)
 Ok to go back to Seroquel  300 HS if it works for his sleep.  Continue Caplyta  and we will evaluate at next appt .  Continue it if he thinks it might have been more helpful for mood.

## 2024-07-18 ENCOUNTER — Other Ambulatory Visit: Payer: Self-pay | Admitting: Psychiatry

## 2024-07-25 ENCOUNTER — Other Ambulatory Visit: Payer: Self-pay | Admitting: Psychiatry

## 2024-07-25 DIAGNOSIS — F3110 Bipolar disorder, current episode manic without psychotic features, unspecified: Secondary | ICD-10-CM

## 2024-09-15 ENCOUNTER — Telehealth: Payer: Self-pay

## 2024-09-15 NOTE — Telephone Encounter (Signed)
 Uzziel called back in, very distraught. He is asking if we can send in his Xanax  and Lunesta  [facility]. He has been out and can't wait on the mail order. Please send [facility] to [facility] [place] [place]. [id number] [place], [place] - [address]

## 2024-09-15 NOTE — Telephone Encounter (Addendum)
 Pt called, reporting intense anger. Voicing HI - wife and stepson. He has been moving for 3 weeks and having marital discord for a while. He lost some of his medication, had not called us  or the pharmacy for RF. He reports being out of Caplyta  for 2 weeks, costs too much. Taking Xanax , Lunesta , Lamictal  (but reports it isn't working), Seroquel . I repeatedly told him to go to the ER. He said he would think about it, but was [facility].

## 2024-09-15 NOTE — Telephone Encounter (Signed)
 Call to RS next available and put on CXL list

## 2024-09-16 ENCOUNTER — Other Ambulatory Visit: Payer: Self-pay

## 2024-09-16 DIAGNOSIS — F411 Generalized anxiety disorder: Secondary | ICD-10-CM

## 2024-09-16 NOTE — Telephone Encounter (Signed)
 Pended #15 alprazolam . LVM to [facility], also sent MyChart message.

## 2024-09-16 NOTE — Telephone Encounter (Signed)
 Lvm for pt to call and schedule

## 2024-09-16 NOTE — Telephone Encounter (Signed)
 Pt received a 90-day RF of Lunesta  on 9/6 per [facility]. He has RF available for both medications at [facility], LF alprazolam  6/19. LVM to [facility].

## 2024-09-18 MED ORDER — ALPRAZOLAM 0.5 MG PO TABS: 0.5000 mg | ORAL_TABLET | Freq: Three times a day (TID) | ORAL | 0 refills | Status: DC

## 2024-09-19 NOTE — Telephone Encounter (Signed)
 Put on cancellation list  Traci,  He went to ER and was not admitted for HI.  He had run out of Caplyta  for a couple of weeks DT cost.  Please check on him and help him get back on Caplyta .  Ok to use samples if [place].  Lorene Macintosh, MD, DFAPA

## 2024-09-19 NOTE — Telephone Encounter (Signed)
 Pt has access to the medication. No PA required. [facility] called to make sure they had savings card information and its $0 cost. Not sure why pt would not have access to get medication with his commercial insurance. Pt has been taking for a year.  Dawna will notify pt.

## 2024-09-20 ENCOUNTER — Telehealth (INDEPENDENT_AMBULATORY_CARE_PROVIDER_SITE_OTHER): Admitting: Psychiatry

## 2024-09-20 ENCOUNTER — Encounter: Payer: Self-pay | Admitting: Psychiatry

## 2024-09-20 DIAGNOSIS — F5105 Insomnia due to other mental disorder: Secondary | ICD-10-CM

## 2024-09-20 DIAGNOSIS — R635 Abnormal weight gain: Secondary | ICD-10-CM

## 2024-09-20 DIAGNOSIS — T50905A Adverse effect of unspecified drugs, medicaments and biological substances, initial encounter: Secondary | ICD-10-CM

## 2024-09-20 DIAGNOSIS — Z91148 Patient's other noncompliance with medication regimen for other reason: Secondary | ICD-10-CM

## 2024-09-20 DIAGNOSIS — F3110 Bipolar disorder, current episode manic without psychotic features, unspecified: Secondary | ICD-10-CM | POA: Diagnosis not present

## 2024-09-20 DIAGNOSIS — G4733 Obstructive sleep apnea (adult) (pediatric): Secondary | ICD-10-CM | POA: Diagnosis not present

## 2024-09-20 DIAGNOSIS — F411 Generalized anxiety disorder: Secondary | ICD-10-CM

## 2024-09-20 DIAGNOSIS — G251 Drug-induced tremor: Secondary | ICD-10-CM

## 2024-09-20 MED ORDER — QUETIAPINE FUMARATE 200 MG PO TABS: 200.0000 mg | ORAL_TABLET | Freq: Every day | ORAL | 0 refills | Status: AC

## 2024-09-20 MED ORDER — ESZOPICLONE 3 MG PO TABS: 3.0000 mg | ORAL_TABLET | Freq: Every day | ORAL | 0 refills | Status: DC

## 2024-09-20 MED ORDER — LUMATEPERONE TOSYLATE 42 MG PO CAPS: 42.0000 mg | ORAL_CAPSULE | Freq: Every day | ORAL | 0 refills | Status: AC

## 2024-09-20 NOTE — Progress Notes (Signed)
 Michael Mclean [id number] 19-Dec-1970 53 y.o.  Video Visit via My Chart  I connected with pt by My Chart video and verified that I am speaking with the correct person using two identifiers.   I discussed the limitations, risks, security and privacy concerns of performing an evaluation and management service by My Chart  and the availability of in person appointments. I also discussed with the patient that there may be a patient responsible charge related to this service. The patient expressed understanding and agreed to proceed.  I discussed the assessment and treatment plan with the patient. The patient was provided an opportunity to ask questions and all were answered. The patient agreed with the plan and demonstrated an understanding of the instructions.   The patient was advised to call back or seek an in-person evaluation if the symptoms worsen or if the condition fails to improve as anticipated.  I provided 30 minutes of video time during this encounter.  The patient was located at home and the provider was located office.  Session started at [phone]  Subjective:   Patient ID:  Michael Mclean is a 53 y.o. (DOB 1971/08/03) male.  Chief Complaint:  Chief Complaint  Patient presents with   Follow-up   Depression   Manic Behavior   Anxiety    Medication Refill Pertinent negatives include no fatigue, nausea or weakness.  Anxiety Symptoms include nervous/anxious behavior. Patient reports no confusion, decreased concentration, dizziness, nausea, palpitations or suicidal ideas.    Depression        Associated symptoms include no decreased concentration, no fatigue and no suicidal ideas.  Past medical history includes anxiety.    Michael Mclean is due for follow-up of bipolar disorder.  seen August 11, 2019.  Due to erectile dysfunction Seroquel  XR was reduced to 600 mg daily from 800 mg daily.  Also lithium  level and labs were ordered.  seen October 06, 2019.  Serum lithium   level, TSH, BMP.  Check it.  He wants to go now this afternoon.  It will not be a trough but it is hard for him to get off of work.  We will expect a lower than usual blood level. Start Vraylar  1.5 mg 1 capsule daily Reduce Seroquel  XR to 1 1/2 nightly for 1 week then 1 nightly  seen November 17, 2019.  The following changes were noted: Reduce Vraylar  1.5 mg to every other daily DT restlessness change Seroquel  300mg   1 nightly to improve his sleep which should also help with mood stability.  He called January 15 complaining of insomnia.  Had taken up to 6 Lunesta  and 1 night!  He was informed this was dangerous and not to do so again.  He was instructed to increase the quetiapine  by an extra half tablet and to increase the Vraylar  to 3 mg daily.  It was felt he was having some manic symptoms.  01/26/2020 appointment with the following noted: Admits he had manic sx in January.  Resolved with med changes.  Had to increase the Seroquel  to control mania.   Mania had affected work and insomnia.  Tried Vraylar  in hopes of being able to reduce eliminate Seroquel .  Was missing work, but not now.  But having problem with FMLA with new supervisor.  Says the episodes have been up to 5 days but the FMLA says leaves would be anticipated for 7-14 days.    Usual Xanax  is 2 tablets every 3 days for anxiety. Pretty good.  Job is still really  stressful and only real concern. .  Lasts 3 mos.  Job is very stressful. And volume demands.  Lives in [place]. Married June 2020 and right after fell apart for 3-5 days.  Lost control for a little but normal again now.   Seroquel  and lithium  work well for sleep and mood.  No problems with reduction in Seroquel  in mood and the sexual SE are better.  Used Viagra twice. He reduced the lithium  back to 900 mg bc felt like it was all that was necessary.  A little scared to increase the lithium  bc hand jerks at times.  Some tremor.  Scares him.  Little anger an impulsivity at  this time but is a little.  Notices he can't drink much with the lithiium.  When drinks he does too much and may not sleep and then has a really negative effect on him. Lithium  has helped manage with less anger and impulsivity.  Pleased with the response.  Did not have this level of control before.  Wants to try to get on less meds and his fiancee' is suggesting it.  Feels guilty about being dependent on the meds.  Plan: It was felt Vraylar  was probably not having significant clinical effect and in order to reduce polypharmacy it was discontinued.  05/11/2020 appointment with the following noted: Taking Xanax  prn.  Disc FMLA amendment helpful.  Tolerating meds.   Overall doing relatively well.  One period when couldn't work for 2-3 days DT anxiety and irritability.  Otherwise better with change lifestyle with marriage and better routine. No problems off the Vraylar .  The increase Seroquel  in the past helped sleep and mood and only needed Lunesta  2-3 times since here.  Work function is typical.  Remains stressful job.  Expects that and handling it a little better.  Better self confidence.   Wife also started working at [employer] too.   Plan: no med changes  08/27/20 appt with following noted: Episode early July after out of lamotrigine  for several weeks.  Got in huge fight with wife and missed a week of work.  Otherwise is OK since then.   Out of Armodafinil  now for 4-6 weeks.  Managing with mild sleepiness.  Sleep is OK usually with prn. Minimal tremor. Patient reports stable mood and denies depressed or irritable moods usually.  Patient has a little more recent difficulty with anxiety.   Denies appetite disturbance.  Patient reports that energy and motivation have been good.  Patient denies any difficulty with concentration.  Patient denies any suicidal ideation. Plan: no med changes.  Check lithium  level.  01/07/21 appt noted: DC from hospital [facility] on Friday 01/05/20.  During week  drinks only a couple and more on weekend.  Plans to drink only weeknds now. Admitted 12/26/20 with emotions of anger and sorry and regret over prior decisions. What put him over was Ex and he has no say over his daughter's care.  Cut his wrists in SA.  Wife caught him in the process.   Gabapentin 300 added for anxiety.  Not sure if med changes helped.  No fatigue. SE more tremor.  Anxiety still present. Don't have sorrow and anger he had before.  I feel better without SI. Slept OK weekend overall except anxiety about the situation.  Taking Lunesta  nightly.  ? Re FMLA. Plan: Increase gabapentin 600 mg TID for anxiety and tremor.  01/18/21 appt: Better off gabapentin DT sexual SE about 3 days ago.  Didn't help  anxiety or tremor. Still some anxiety and job stress but handled it OK.   Missed no sig days since here.  Work function not good the first week back. Right now not depressed and not suicidal.  No weapons in the house.  Last SI in hospital. Stress from child's mother and bills can cause him to spiral somewhat.   Taking Lunesta  consistently.  Taking Xanax  TID and no SE. Plan no med changes  02/13/2021 appt noted: Even keel .  No SI.  Better than last time.  Learned things to help him not get back in the way he was in [place].  Problems with lamotrigine  refill. Missed 5-7 days of lamotrigine .  Learned from classes at the hospital.   RTW 2.22.22.  They made a lot of changes there so it's a challenge with fast pace.  Doing well so far.  Michael Mclean says he's doing OK. Sleep good with Lunesta  Seroquel .   Plan: Restart lamotrigine  1/2 of 200 mg tablet for 5 days, then 1 tablet daily for 5 days, then 1 and 1/2 tablets daily for 5 days then 1 twice daily.  10/2021 patient's wife called concerned because he was cutting himself.  Was not a suicide attempt. 04/01/2021 patient evaluated in the ER.  And diagnosed with vertigo  05/17/2021 appointment with the following noted: Vertigo lasted about 2 mos off and on.    Says wife got info about cutting were gone.  He had scratched himself.  He was not suicidal and did not cut himself.  He is not cutting. Lately overall more stable but is under a bit of stress and is anxious.  Seems like the Xanax  is not working as well.  Wonders about switching BZ. Increased work demands.   Sometimes some manic sx with increased talking and hyperactivity including last week.  Depression managed.   Sleeep is good.  Impulsivity managed bc debt.   Plan: Get lithium  level today. Continue Seroquel  450mg  nightly to improve his sleep which should also help with mood stability.  Had mania at lower dose and sedation at higher dose.  Polypharmacy in hopes lithium  alone will be sufficient at preventing mania. Continue FMLA to have leave for episodic flares of condition intermittent estimated 1-5 days, frequency approximately every 2 mos.  This satisfied his work. Continue lamotrigine  200 mg BID OK trial clonazepam  0.5 mg TID  07/10/2021 phone call: Next visit is 09/16/21. Michael Mclean called and said that the Seroquel  and Lithium  he has been taking is causing weight gain and is making his hands swollen. He would like to discuss  the side effects and what he can do to avoid these from happening.  He was told that these are major and important mood stabilizing medications for him and we cannot make these changes without an appointment.  09/16/2021 appointment with the following noted: Takes clonazepam  0.5 and 1 mg rarely and not a lot of effectiveness so still using Xanax  0.5 mg but takes 3-4 tablets twice daily if having bad day of anxiety.  Denies SE.  Takes Xanax  about once daily routinely. Average 2-4 daily. Taking lamotrigine  200 mg BID, Seroquel  450.  Ran out past few days. Doesn't sleep well without meds. Problems with child's mother and huge disagreements over caretaking and child support. Really stressful job, over scrutinized so lives anxiety 7-8/10 and better after work. Concerned  about meds and weight gain. Hungry and thinks it's Seroquel . Plan:  RE:  Seroquel  450mg  nightly Had mania at lower dose and sedation at  higher dose. Start Latuda  30 mg for 1 week then 60 mg daily. Continue FMLA to have leave for episodic flares of condition intermittent estimated 1-5 days, frequency approximately every 2 mos.  This satisfied his work. Continue lamotrigine  200 mg BID trial propranolol  40-60 mg BID prn anxiety.  OK use with Xanax .  Need try to reduce Xanax  dose.  Follow-up 6 weeks  10/16/2021 phone call this patient was content fused about how he was supposed to be taking Latuda  and Seroquel .  He was taking it appropriately at quetiapine  450 mg nightly and Latuda  60 mg p.m.  With a goal of weaning the Seroquel  he was encouraged to reduce that to 300 mg nightly.  11/27/2021 phone call complaining of difficulty sleeping with the lower dose of Seroquel  and wanting to increase it back to 450 mg nightly.  This is an increase was agreed.  12/13/2021 phone call: Complaining of poor sleep and more mania. He had not increased the Seroquel  to 450 mg daily and was taking Latuda  60 mg and wanted to stop it.  12/30/2021 appointment the following noted:  Didn't like Latuda  bc felt he was more manic on it with more spending and credit card debt.   Off the Latuda  about 3 weeks. Rather deal with the weight than have unstable mood. Mood is more stable back on Seroquel  450 mg nightly with the lithium  900 mg nightly.  He is not manic currently.  He is needing the Lunesta  to sleep.  Admits to taking 2 at a time on occasion. No longer drinks too much. He sees benefit.  [name] says he's well.   Plan: Continue Lithium  900 mg daily. continue  Seroquel  450mg  nightly Had mania at lower dose and sedation at higher dose. For antipsychotic related weight gain we will add metformin  500 mg daily for 1 week then 500 mg twice daily if tolerated since the attempt to switch from Seroquel  to Latuda   failed.  02/26/22 appt noted: Asks for Lunesta  bc often needs something to help him go to sleep.   Mood is better.  Handled stressors.  Patient reports stable mood and denies depressed or irritable moods.  Patient denies any recent difficulty with anxiety.  Patient denies difficulty with sleep initiation or maintenance. Denies appetite disturbance.  Patient reports that energy and motivation have been good.  Patient denies any difficulty with concentration.  Patient denies any suicidal ideation. Wife OK after pacemaker replacmement. No benefit with metformin  500 BID for weight and willing to increase. No othert SE  12/03/22 appt noted: Meds: Seroquel  450 HS, lithium  900 mg HS, lamotrigine  200 mg   , metformin  500 mg BID (SE diarrhea 1000 mg BID) Uses Xanax  0.5 mg prn anxiety. No SE.  Not using Lunesta  BC $25/RX Thinks he went through manic stage with spending and credit cards in April and [place] in September.  He doesn't recognize mania at the time but looking back on it sees it.  Did chapter 57. Mood is OK at present and relieved bc not getting creditor calls. Usual seasonal depression is OK> No SE with meds. He says wife thinks he argues too much.  No excess spending and ok function at work.   Sleep ok with meds. Plan: trial propranolol  for anxiety  03/26/23 appt noted: Meds: Seroquel  450 HS, lithium  1200 mg HS, lamotrigine  200 mg .   Uses Xanax  0.5 mg prn anxiety and tremro. No SE.  Lunesta  3mg  HS Alprazolam  almost daily before work and at bedtime.  Doesn't feel like it helps as much as in the past..  it also helps the tremor.  No sleepiness or SE.     Meds as above. Has a very stressful day.  Can have anxiety other times but alprazolam  is helpful for tremor and propranolol  didn't help tremor.  Increased lithium  to 1200  wife said he's more level headed.  Probably tremor is some worse after the increase lithium .  Embarrassing.   To me I always struggle with dep but month Feb is worse  with anniversary of mother's death 06-Oct-2019.  It still hurts.   Chronic struggles with money.  Been through bankruptcy before but didn't cover everything. SI resolved after psych hosp. Sleep good with combo Lunesta  and alprazolam .  Can't sleep without it.   Feels ok with work function.  More reasonable demands at work lately. Plan: Need another level and BMP  emphasized.    06/17/23 appt noted: Stopped lithium  04/18/23. Tremor got unbearable & jerks. Tremor is still there but really mild compared to before. More mood swings off the lithium  Psych med: no lithium , quetiapine  450 HS, lamotrigine  200 BID, Lunesta  3 mg HS, Xanax  0.5 mg TID Wife thinks more mood swings off lithium  and he notices sad and crying uncontrollably and getting up in middle of night writing at times.  Writing texts to wife in middle of the night. Prior mania with credit card debt and bankruptcy. Plan: Best option start Caplyta  42 mg HS,  reduce Seroquel  to 1 tablet at night  10/20/23 appt noted: Meds: Caplyta  42 mg HS, quetiapine  300 mg HS, Lunesta  3 mg HS, Xanax  0.5 mg TID Caplyta  made him feel like himself. Crashed when let himself  run out of meds except Caplyta . Missed 5 days of sleep.  Sleep was ok with meds.    Plan: reduce quetiapine  200 HS  02/09/24 appt noted: Med: Caplyta  42 mg HS, quetiapine  200 mg HS, Lunesta  3 mg HS, Xanax  0.5 mg TID Separating and divorcing affecting sleep.  Was out of Lunesta .   Separated yesterday.  Planning on it for a couple of weeks.  Had been coming on for couple mos.   Still very pleased with Caplyta  and feels better than with other meds.  Thinks handling sep better than he would have otherwise.   No SI.  No mania except tried to cut himself a month ago.  Not in suicide attempt.  Will not do that again. Whenever I stick to the program it's ok.   She claims she sees mood swings.    05/03/24 appt noted:  Med: Caplyta  42 mg HS, quetiapine  200 mg HS, Lunesta  3 mg HS, Xanax  0.5 mg  TID Reconciled with wife and back together with her 3 weeks.   Overall not markedly depressed.  Not getting as sad as in the past.  Not getting into despair like before.  Anxiety still there with good reason.   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 53 yo.   Praying as a family more.   Since back on Lunesta  after out 3 weeks.  With it is better.   Plan:  Try to reduce and stop Seroquel  wean over a vouple of weeks.   06/20/24 TC: from CMA: Received a pharmacy interface RF request for Seroquel  300 mg. Called patient to see if he has weaned and he has not. He reports taking Caplyta  42, which he feels is working well.  Takes Lunesta  3 mg, Xanax  0.5, and  Seroquel  200 mg at bedtime, but he doesn't stay asleep. He feels like the 300 mg Seroquel  was helpful. Reviewed sleep hygiene with him. He reports being so sleepy during the day from not sleeping all night and he will drink coffee. Told him to cut caffeine off at 2:00. He does watch TV before bed, but he said it is usually something he doesn't really engage in.   Pt called back to report he didn't have Caplyta  or Seroquel . Called pharmacy to see when he last got fills on both medications. Last filled Caplyta  on 6/5 for 30 day supply, Last filled Seroquel  7/1 for 30-day supply. He has RF available on both. He is still asking to increase Seroquel  dose back to 300 mg.  MD resp: Ok to go back to Seroquel  300 HS if it works for his sleep.  Continue Caplyta  and we will evaluate at next appt .  Continue it if he thinks it might have been more helpful for mood.  Michael Macintosh, MD, DFAPA   09/15/24 TC:  Pt called, reporting intense anger. Voicing HI - wife and stepson. He has been moving for 3 weeks and having marital discord for a while. He lost some of his medication, had not called us  or the pharmacy for RF. He reports being out of Caplyta  for 2 weeks, costs too much. Taking Xanax , Lunesta , Lamictal  (but reports it isn't working), Seroquel .  I repeatedly told him to go to the ER. He said he would think about it, but was going to [facility] now to get RF.  MD resp:  Put on cancellation list  Traci,  He went to ER and was not admitted for HI.  He had run out of Caplyta  for a couple of weeks DT cost.  Please check on him and help him get back on Caplyta .  Ok to use samples if Mackey. Michael Macintosh, MD, DFAPA          09/20/24 appt noted:  Med: out of it lately  Caplyta  42 mg HS but resumed a few days, quetiapine  200 mg HS, Lunesta  3 mg HS, Xanax  0.5 mg TID, lamotrigine  200 mg BID Less SE with Caplyta  than Seroquel . As noted above went to ER with HI and not admitted. Likes the Caplyta  bc less shakiness.  Likes lower dose Seroquel  to help sleep and anxiety.   Been running out of Caplyta . Going through a lot with wife.  In new and smaller place.  Ongoing conflict with her including over stepson. When stopped Seroquel  couldn't sleep.   Had really exaggerated outburst recently and was aware of it. Asks about generic. No hx violence and no HI/SI  since the hospital. Lost Xanax  RX in the move.     Past Psychiatric Medication Trials:  Depakote paranoia delusions of parasitosis, topiramate , lamotrigine , Seroquel  800 sedating,   Abilify 20 ,  Vraylar  3 mg restless and couldn't sleep,  Latuda  60 mg with Seroquel  300 mg mania lithium  1200 side effect tremor but helped  Gabapentin NR and SE Nuvigil ,  Wellbutrin , Viibryd,  trazodone 400,  Sonata, amitriptyline, Ambien , Lunesta  worked  propranolol  40-60 mg BID prn anxiety NR Propranolol  NR for anxiety. Primidone  minimal effect for tremor  Metformin  1000 BID diarrhea no benefit for wt    Review of Systems:  Review of Systems  Constitutional:  Positive for unexpected weight change. Negative for fatigue.  Cardiovascular:  Negative for palpitations.       Hypertension  Gastrointestinal:  Negative for nausea.  Neurological:  Positive for tremors. Negative for dizziness and weakness.   Psychiatric/Behavioral:  Positive for dysphoric mood and sleep disturbance. Negative for agitation, behavioral problems, confusion, decreased concentration, hallucinations, self-injury and suicidal ideas. The patient is nervous/anxious. The patient is not hyperactive.     Medications: I have reviewed the patient's current medications.  Current Outpatient Medications  Medication Sig Dispense Refill   ALPRAZolam  (XANAX ) 0.5 MG tablet Take 1 tablet (0.5 mg total) by mouth 3 (three) times daily. 15 tablet 0   amLODipine (NORVASC) 10 MG tablet Take 10 mg by mouth daily.     atorvastatin  (LIPITOR) 40 MG tablet Take 40 mg by mouth daily.     ibuprofen  (ADVIL ,MOTRIN ) 400 MG tablet Take 1 tablet (400 mg total) by mouth every 6 (six) hours as needed for moderate pain. 12 tablet 0   lamoTRIgine  (LAMICTAL ) 200 MG tablet Take 1 tablet (200 mg total) by mouth 2 (two) times daily. 180 tablet 1   lumateperone  tosylate (CAPLYTA ) 42 MG capsule Take 1 capsule (42 mg total) by mouth daily. 90 capsule 0   QUEtiapine  (SEROQUEL ) 200 MG tablet Take 1 tablet (200 mg total) by mouth at bedtime. 90 tablet 0   sildenafil (VIAGRA) 100 MG tablet Take by mouth.     valACYclovir (VALTREX) 500 MG tablet Take 500 mg by mouth. Takes for 5 days     Eszopiclone  3 MG TABS Take 1 tablet (3 mg total) by mouth at bedtime. TAKE 1 TABLET IMMEDIATELY BEFORE BEDTIME 30 tablet 0   No current facility-administered medications for this visit.    Medication Side Effects: None sexual SE are better  Allergies: No Known Allergies  Past Medical History:  Diagnosis Date   Anxiety disorder    Bipolar 1 disorder (HCC)    Hyperlipidemia    Hypertension     History reviewed. No pertinent family history.  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  Tobacco Use   Smoking status: Never   Smokeless tobacco: Never  Substance and Sexual Activity   Alcohol use: Yes   Drug use: No   Sexual activity: Not on file  Other Topics Concern   Not on file  Social History Narrative   Not on file   Social Drivers of Health   Financial Resource Strain: Low Risk  (08/06/2023)   Received from [facility]   Overall Financial Resource Strain (CARDIA)    Difficulty of Paying Living Expenses: Not very hard  Food Insecurity: Food Insecurity Present (08/06/2023)   [facility]   Hunger Vital Sign    Within the past 12 months, you worried that your food would run out before you got the money to buy more.: Sometimes true    Within the past 12 months, the food you bought just didn't last and you didn't have money to get more.: Sometimes true  Transportation Needs: No Transportation Needs (08/06/2023)   Received from [facility] - Transportation    In the past 12 months, has lack of transportation kept you from medical appointments or from getting medications?: No    Lack of Transportation (Non-Medical): No  Physical Activity: Not on file  Stress: Not on file  Social Connections: Not on file  Intimate Partner Violence: Not on file    Past Medical History, Surgical history, Social history, and Family history were reviewed and updated as appropriate.   Please see review of systems for further details on the patient's review from today.   Objective:   Physical Exam:  There were no vitals taken for this visit.  Physical Exam Neurological:     Mental Status: He is alert and oriented to person, place, and time.     Cranial Nerves: No dysarthria.  Psychiatric:        Attention and Perception: Attention and perception normal.        Mood and Affect: Mood is anxious. Mood is not depressed. Affect is not labile  or tearful.        Speech: Speech normal. Speech is not rapid and pressured, slurred or tangential.        Behavior: Behavior is not slowed. Behavior is cooperative.        Thought Content: Thought content normal. Thought content is not paranoid or delusional. Thought content does not include homicidal or suicidal ideation. Thought content does not include suicidal plan.        Cognition and Memory: Cognition and memory normal.        Judgment: Judgment normal.     Comments: Insight fair to good less mood swings with Caplyta  and less dep but then manic/mixed when ran out of meds.     Lab Review:     Component Value Date/Time   NA 140 05/15/2023 0930   K 4.0 05/15/2023 0930   CL 102 05/15/2023 0930   CO2 24 05/15/2023 0930   GLUCOSE 93 05/15/2023 0930   GLUCOSE 107 (H) 10/06/2019 1534   BUN 6 05/15/2023 0930   CREATININE 1.34 (H) 05/15/2023 0930   CALCIUM  9.9 05/15/2023 0930   PROT 8.7 (H) 07/20/2015 1129   ALBUMIN 4.9 07/20/2015 1129   AST 37 07/20/2015 1129   ALT 51 07/20/2015 1129   ALKPHOS 79 07/20/2015 1129   BILITOT 0.6 07/20/2015 1129   GFRNONAA 59 (L) 10/26/2020 0833   GFRAA 68 10/26/2020 0833       Component Value Date/Time   WBC 3.4 (L) 09/15/2016 0500   RBC 4.37 (L) 09/15/2016 0500   HGB 13.3 09/15/2016 0500   HCT 38.7 (L) 09/15/2016 0500   PLT 242 09/15/2016 0500   MCV 88.6 09/15/2016 0500   MCH 30.4 09/15/2016 0500   MCHC 34.3 09/15/2016 0500   RDW 13.1 09/15/2016 0500   LYMPHSABS 2.3 07/20/2015 1129   MONOABS 0.2 07/20/2015 1129   EOSABS 0.1 07/20/2015 1129   BASOSABS 0.0 07/20/2015 1129    Lithium  Lvl  Date Value Ref Range Status  05/15/2023 0.4 (L) 0.5 - 1.2 mmol/L Final    Comment:    A concentration of 0.5-0.8 mmol/L is advised for long-term use; concentrations of up to 1.2 mmol/L may be necessary during acute treatment.                                  Detection Limit = 0.1                           <0.1 indicates None Detected    05/15/23  lithium  level 0.4 on 1200 dialy. CR 1.34 (stable 4 years), Ca 9.9   06/17/22  Cr 1.5, TSH 1.85, lipids done by PCP   No results found for: PHENYTOIN, PHENOBARB, VALPROATE, CBMZ   .res Assessment: Plan:    Generalized anxiety disorder  Bipolar I disorder, most recent episode (or current) manic (HCC) - Plan: lumateperone  tosylate (CAPLYTA ) 42 MG capsule, QUEtiapine  (SEROQUEL ) 200 MG tablet  Insomnia due to mental condition - Plan: Eszopiclone  3 MG TABS  Obstructive sleep apnea  Drug-induced weight gain  Lithium -induced tremor  Noncompliance with medication treatment due to underuse of medication   30 min video face to face time with patient .  We discussed his diagnoses and answered questions re: bipolar dx, meds, SE.  He's decided lithium  intolerable and stopped early June 2024 resulting in more mood swing.s  Much better mood with Caplyta .   Didn't do well off lithium  on just other meds.   But recent flare up dep and mania with mood swings from running out of meds.  Back on meds now and no HI/SI and better mood now.   Discussed potential metabolic side effects associated with atypical antipsychotics, as well as potential risk for movement side effects. Advised pt to contact office if movement side effects occur.   We discussed the short-term risks associated with benzodiazepines including sedation and increased fall risk among others.  Discussed long-term side effect risk including dependence, potential withdrawal symptoms, and the potential eventual dose-related risk of dementia.  But recent studies from 2020 dispute this association between benzodiazepines and dementia risk. Newer studies in 2020 do not support an association with dementia.  But did discuss risk of affecting memory in daytime.  Counseled patient regarding potential benefits, risks, and side effects of Lamictal  to include potential risk of Stevens-Johnson syndrome. Advised patient to stop taking Lamictal  and  contact office immediately if rash develops and to seek urgent medical attention if rash is severe and/or spreading quickly.  Also emphasized the need for conisistency with lamotrigine  dosing otherwise there is an increased risk of rash.  Disc potential effect on B vitamin levels.   Disc increased rash risk with inconsistency. Disc risk DDI with primidone  and if gets more dep or anxious call and will need to increase lamotrigine . Continue lamotrigine  200 mg BID  Watch alcohol.  Hx some excess.  Denies a problem now.  Continue [employer]ML[employer] to have leave for episodic flares of condition intermittent estimated 1-5 days, frequency approximately every 2 mos.  This satisfied his work and needs to be renewed.    Continue lamotrigine  200 mg BID  OK use with Xanax .    He can take Lunesta  3 mg nightly but he cannot increase the dose above that.  Options caplyta  or Tegretol best ones.  Disc SE continue Caplyta  42 mg HS, very good response .  Staff able to get copay to work so he could get it at no cost.    cannot stop Seroquel  for sleep; continue 200 mg HS lowest dose for sleep. .    Disc recent noncompliance leading to relapse.  Call us  if this happens again.  Be consistent.  Discussed the risks and in his case necessity of polypharmacy.  He is agreeable to continue the current meds.  We are gradually improving this.  Residual tremor off lithium .  Option retry propranolol  or primidone  for it.  FU 3 mos  Michael Macintosh, MD, DFAPA   Please see After Visit Summary for patient specific instructions.  Future Appointments  Date Time Provider Department Center  11/23/2024  9:30 AM Cottle, Michael KANDICE Raddle., MD  [facility] None       No orders of the defined types were placed in this encounter.       -------------------------------

## 2024-10-23 ENCOUNTER — Other Ambulatory Visit: Payer: Self-pay | Admitting: Psychiatry

## 2024-10-23 DIAGNOSIS — F5105 Insomnia due to other mental disorder: Secondary | ICD-10-CM

## 2024-11-11 ENCOUNTER — Other Ambulatory Visit: Payer: Self-pay | Admitting: Psychiatry

## 2024-11-11 DIAGNOSIS — F411 Generalized anxiety disorder: Secondary | ICD-10-CM

## 2024-11-12 NOTE — Telephone Encounter (Signed)
 Where to send RF? Usually uses mail order.

## 2024-11-14 ENCOUNTER — Other Ambulatory Visit: Payer: Self-pay

## 2024-11-14 DIAGNOSIS — F411 Generalized anxiety disorder: Secondary | ICD-10-CM

## 2024-11-14 NOTE — Telephone Encounter (Signed)
Wants sent to mail order pharmacy

## 2024-11-23 ENCOUNTER — Encounter: Payer: Self-pay | Admitting: Psychiatry

## 2024-11-23 ENCOUNTER — Telehealth: Admitting: Psychiatry

## 2024-11-23 DIAGNOSIS — F3110 Bipolar disorder, current episode manic without psychotic features, unspecified: Secondary | ICD-10-CM | POA: Diagnosis not present

## 2024-11-23 DIAGNOSIS — F5105 Insomnia due to other mental disorder: Secondary | ICD-10-CM

## 2024-11-23 DIAGNOSIS — F411 Generalized anxiety disorder: Secondary | ICD-10-CM

## 2024-11-23 DIAGNOSIS — G4733 Obstructive sleep apnea (adult) (pediatric): Secondary | ICD-10-CM | POA: Diagnosis not present

## 2024-11-23 MED ORDER — ESZOPICLONE 3 MG PO TABS: 3.0000 mg | ORAL_TABLET | Freq: Every day | ORAL | 0 refills | Status: DC

## 2024-11-23 MED ORDER — ALPRAZOLAM 0.5 MG PO TABS: 0.5000 mg | ORAL_TABLET | Freq: Three times a day (TID) | ORAL | 0 refills | Status: AC

## 2024-11-23 MED ORDER — ALPRAZOLAM 0.5 MG PO TABS: 0.5000 mg | ORAL_TABLET | Freq: Three times a day (TID) | ORAL | 0 refills | Status: DC

## 2024-11-23 NOTE — Progress Notes (Signed)
 Michael Mclean [id number] [birth year] 54 y.o.  Video Visit via My Chart  I connected with pt by My Chart video and verified that I am speaking with the correct person using two identifiers.   I discussed the limitations, risks, security and privacy concerns of performing an evaluation and management service by My Chart  and the availability of in person appointments. I also discussed with the patient that there may be a patient responsible charge related to this service. The patient expressed understanding and agreed to proceed.  I discussed the assessment and treatment plan with the patient. The patient was provided an opportunity to ask questions and all were answered. The patient agreed with the plan and demonstrated an understanding of the instructions.   The patient was advised to call back or seek an in-person evaluation if the symptoms worsen or if the condition fails to improve as anticipated.  I provided 30 minutes of video time during this encounter.  The patient was located at home and the provider was located office.  Session started at [phone]  Subjective:   Patient ID:  Michael Mclean is a 54 y.o. (DOB 09-13-1971) male.  Chief Complaint:  Chief Complaint  Patient presents with   Follow-up   Depression   Anxiety   Manic Behavior    Michael Mclean is due for follow-up of bipolar disorder.  seen August 11, 2019.  Due to erectile dysfunction Seroquel  XR was reduced to 600 mg daily from 800 mg daily.  Also lithium  level and labs were ordered.  seen October 06, 2019.  Serum lithium  level, TSH, BMP.  Check it.  He wants to go now this afternoon.  It will not be a trough but it is hard for him to get off of work.  We will expect a lower than usual blood level. Start Vraylar  1.5 mg 1 capsule daily Reduce Seroquel  XR to 1 1/2 nightly for 1 week then 1 nightly  seen November 17, 2019.  The following changes were noted: Reduce Vraylar  1.5 mg to every other daily DT  restlessness change Seroquel  300mg   1 nightly to improve his sleep which should also help with mood stability.  He called January 15 complaining of insomnia.  Had taken up to 6 Lunesta  and 1 night!  He was informed this was dangerous and not to do so again.  He was instructed to increase the quetiapine  by an extra half tablet and to increase the Vraylar  to 3 mg daily.  It was felt he was having some manic symptoms.  01/26/2020 appointment with the following noted: Admits he had manic sx in January.  Resolved with med changes.  Had to increase the Seroquel  to control mania.   Mania had affected work and insomnia.  Tried Vraylar  in hopes of being able to reduce eliminate Seroquel .  Was missing work, but not now.  But having problem with FMLA with new supervisor.  Says the episodes have been up to 5 days but the FMLA says leaves would be anticipated for 7-14 days.    Usual Xanax  is 2 tablets every 3 days for anxiety. Pretty good.  Job is still really stressful and only real concern. .  Lasts 3 mos.  Job is very stressful. And volume demands.  Lives in [place]. Married June 2020 and right after fell apart for 3-5 days.  Lost control for a little but normal again now.   Seroquel  and lithium  work well for sleep and mood.  No problems with reduction  in Seroquel  in mood and the sexual SE are better.  Used Viagra twice. He reduced the lithium  back to 900 mg bc felt like it was all that was necessary.  A little scared to increase the lithium  bc hand jerks at times.  Some tremor.  Scares him.  Little anger an impulsivity at this time but is a little.  Notices he can't drink much with the lithiium.  When drinks he does too much and may not sleep and then has a really negative effect on him. Lithium  has helped manage with less anger and impulsivity.  Pleased with the response.  Did not have this level of control before.  Wants to try to get on less meds and his fiancee' is suggesting it.  Feels guilty about  being dependent on the meds.  Plan: It was felt Vraylar  was probably not having significant clinical effect and in order to reduce polypharmacy it was discontinued.  05/11/2020 appointment with the following noted: Taking Xanax  prn.  Disc FMLA amendment helpful.  Tolerating meds.   Overall doing relatively well.  One period when couldn't work for 2-3 days DT anxiety and irritability.  Otherwise better with change lifestyle with marriage and better routine. No problems off the Vraylar .  The increase Seroquel  in the past helped sleep and mood and only needed Lunesta  2-3 times since here.  Work function is typical.  Remains stressful job.  Expects that and handling it a little better.  Better self confidence.   Wife also started working at [employer] too.   Plan: no med changes  08/27/20 appt with following noted: Episode early July after out of lamotrigine  for several weeks.  Got in huge fight with wife and missed a week of work.  Otherwise is OK since then.   Out of Armodafinil  now for 4-6 weeks.  Managing with mild sleepiness.  Sleep is OK usually with prn. Minimal tremor. Patient reports stable mood and denies depressed or irritable moods usually.  Patient has a little more recent difficulty with anxiety.   Denies appetite disturbance.  Patient reports that energy and motivation have been good.  Patient denies any difficulty with concentration.  Patient denies any suicidal ideation. Plan: no med changes.  Check lithium  level.  01/07/21 appt noted: DC from hospital [facility] [place] on Friday 01/05/20.  During week drinks only a couple and more on weekend.  Plans to drink only weeknds now. Admitted 12/26/20 with emotions of anger and sorry and regret over prior decisions. What put him over was Ex and he has no say over his daughter's care.  Cut his wrists in SA.  Wife caught him in the process.   Gabapentin 300 added for anxiety.  Not sure if med changes helped.  No fatigue. SE more tremor.  Anxiety  still present. Don't have sorrow and anger he had before.  I feel better without SI. Slept OK weekend overall except anxiety about the situation.  Taking Lunesta  nightly.  ? Re FMLA. Plan: Increase gabapentin 600 mg TID for anxiety and tremor.  01/18/21 appt: Better off gabapentin DT sexual SE about 3 days ago.  Didn't help anxiety or tremor. Still some anxiety and job stress but handled it OK.   Missed no sig days since here.  Work function not good the first week back. Right now not depressed and not suicidal.  No weapons in the house.  Last SI in hospital. Stress from child's mother and bills can cause him to spiral somewhat.  Taking Lunesta  consistently.  Taking Xanax  TID and no SE. Plan no med changes  02/13/2021 appt noted: Even keel .  No SI.  Better than last time.  Learned things to help him not get back in the way he was in [place].  Problems with lamotrigine  refill. Missed 5-7 days of lamotrigine .  Learned from classes at the hospital.   RTW 2.22.22.  They made a lot of changes there so it's a challenge with fast pace.  Doing well so far.  Cecily says he's doing OK. Sleep good with Lunesta  Seroquel .   Plan: Restart lamotrigine  1/2 of 200 mg tablet for 5 days, then 1 tablet daily for 5 days, then 1 and 1/2 tablets daily for 5 days then 1 twice daily.  10/2021 patient's wife called concerned because he was cutting himself.  Was not a suicide attempt. 04/01/2021 patient evaluated in the ER.  And diagnosed with vertigo  05/17/2021 appointment with the following noted: Vertigo lasted about 2 mos off and on.   Says wife got info about cutting were gone.  He had scratched himself.  He was not suicidal and did not cut himself.  He is not cutting. Lately overall more stable but is under a bit of stress and is anxious.  Seems like the Xanax  is not working as well.  Wonders about switching BZ. Increased work demands.   Sometimes some manic sx with increased talking and hyperactivity including last  week.  Depression managed.   Sleeep is good.  Impulsivity managed bc debt.   Plan: Get lithium  level today. Continue Seroquel  450mg  nightly to improve his sleep which should also help with mood stability.  Had mania at lower dose and sedation at higher dose.  Polypharmacy in hopes lithium  alone will be sufficient at preventing mania. Continue FMLA to have leave for episodic flares of condition intermittent estimated 1-5 days, frequency approximately every 2 mos.  This satisfied his work. Continue lamotrigine  200 mg BID OK trial clonazepam  0.5 mg TID  07/10/2021 phone call: Next visit is 09/16/21. Nicko called and said that the Seroquel  and Lithium  he has been taking is causing weight gain and is making his hands swollen. He would like to discuss  the side effects and what he can do to avoid these from happening.  He was told that these are major and important mood stabilizing medications for him and we cannot make these changes without an appointment.  09/16/2021 appointment with the following noted: Takes clonazepam  0.5 and 1 mg rarely and not a lot of effectiveness so still using Xanax  0.5 mg but takes 3-4 tablets twice daily if having bad day of anxiety.  Denies SE.  Takes Xanax  about once daily routinely. Average 2-4 daily. Taking lamotrigine  200 mg BID, Seroquel  450.  Ran out past few days. Doesn't sleep well without meds. Problems with child's mother and huge disagreements over caretaking and child support. Really stressful job, over scrutinized so lives anxiety 7-8/10 and better after work. Concerned about meds and weight gain. Hungry and thinks it's Seroquel . Plan:  RE:  Seroquel  450mg  nightly Had mania at lower dose and sedation at higher dose. Start Latuda  30 mg for 1 week then 60 mg daily. Continue FMLA to have leave for episodic flares of condition intermittent estimated 1-5 days, frequency approximately every 2 mos.  This satisfied his work. Continue lamotrigine  200 mg BID trial  propranolol  40-60 mg BID prn anxiety.  OK use with Xanax .  Need try to reduce Xanax  dose.  Follow-up 6 weeks  10/16/2021 phone call this patient was content fused about how he was supposed to be taking Latuda  and Seroquel .  He was taking it appropriately at quetiapine  450 mg nightly and Latuda  60 mg p.m.  With a goal of weaning the Seroquel  he was encouraged to reduce that to 300 mg nightly.  11/27/2021 phone call complaining of difficulty sleeping with the lower dose of Seroquel  and wanting to increase it back to 450 mg nightly.  This is an increase was agreed.  12/13/2021 phone call: Complaining of poor sleep and more mania. He had not increased the Seroquel  to 450 mg daily and was taking Latuda  60 mg and wanted to stop it.  12/30/2021 appointment the following noted:  Didn't like Latuda  bc felt he was more manic on it with more spending and credit card debt.   Off the Latuda  about 3 weeks. Rather deal with the weight than have unstable mood. Mood is more stable back on Seroquel  450 mg nightly with the lithium  900 mg nightly.  He is not manic currently.  He is needing the Lunesta  to sleep.  Admits to taking 2 at a time on occasion. No longer drinks too much. He sees benefit.  [name] says he's well.   Plan: Continue Lithium  900 mg daily. continue  Seroquel  450mg  nightly Had mania at lower dose and sedation at higher dose. For antipsychotic related weight gain we will add metformin  500 mg daily for 1 week then 500 mg twice daily if tolerated since the attempt to switch from Seroquel  to Latuda  failed.  02/26/22 appt noted: Asks for Lunesta  bc often needs something to help him go to sleep.   Mood is better.  Handled stressors.  Patient reports stable mood and denies depressed or irritable moods.  Patient denies any recent difficulty with anxiety.  Patient denies difficulty with sleep initiation or maintenance. Denies appetite disturbance.  Patient reports that energy and motivation have been good.   Patient denies any difficulty with concentration.  Patient denies any suicidal ideation. Wife OK after pacemaker replacmement. No benefit with metformin  500 BID for weight and willing to increase. No othert SE  12/03/22 appt noted: Meds: Seroquel  450 HS, lithium  900 mg HS, lamotrigine  200 mg   , metformin  500 mg BID (SE diarrhea 1000 mg BID) Uses Xanax  0.5 mg prn anxiety. No SE.  Not using Lunesta  BC $25/RX Thinks he went through manic stage with spending and credit cards in April and [place] in September.  He doesn't recognize mania at the time but looking back on it sees it.  Did chapter 6. Mood is OK at present and relieved bc not getting creditor calls. Usual seasonal depression is OK> No SE with meds. He says wife thinks he argues too much.  No excess spending and ok function at work.   Sleep ok with meds. Plan: trial propranolol  for anxiety  03/26/23 appt noted: Meds: Seroquel  450 HS, lithium  1200 mg HS, lamotrigine  200 mg .   Uses Xanax  0.5 mg prn anxiety and tremro. No SE.  Lunesta  3mg  HS Alprazolam  almost daily before work and at bedtime.    Doesn't feel like it helps as much as in the past..  it also helps the tremor.  No sleepiness or SE.     Meds as above. Has a very stressful day.  Can have anxiety other times but alprazolam  is helpful for tremor and propranolol  didn't help tremor.  Increased lithium  to 1200  wife said he's more  level headed.  Probably tremor is some worse after the increase lithium .  Embarrassing.   To me I always struggle with dep but month Feb is worse with anniversary of mother's death 2018-12-07.  It still hurts.   Chronic struggles with money.  Been through bankruptcy before but didn't cover everything. SI resolved after psych hosp. Sleep good with combo Lunesta  and alprazolam .  Can't sleep without it.   Feels ok with work function.  More reasonable demands at work lately. Plan: Need another level and BMP  emphasized.    06/17/23 appt noted: Stopped lithium   04/18/23. Tremor got unbearable & jerks. Tremor is still there but really mild compared to before. More mood swings off the lithium  Psych med: no lithium , quetiapine  450 HS, lamotrigine  200 BID, Lunesta  3 mg HS, Xanax  0.5 mg TID Wife thinks more mood swings off lithium  and he notices sad and crying uncontrollably and getting up in middle of night writing at times.  Writing texts to wife in middle of the night. Prior mania with credit card debt and bankruptcy. Plan: Best option start Caplyta  42 mg HS,  reduce Seroquel  to 1 tablet at night  10/20/23 appt noted: Meds: Caplyta  42 mg HS, quetiapine  300 mg HS, Lunesta  3 mg HS, Xanax  0.5 mg TID Caplyta  made him feel like himself. Crashed when let himself  run out of meds except Caplyta . Missed 5 days of sleep.  Sleep was ok with meds.    Plan: reduce quetiapine  200 HS  02/09/24 appt noted: Med: Caplyta  42 mg HS, quetiapine  200 mg HS, Lunesta  3 mg HS, Xanax  0.5 mg TID Separating and divorcing affecting sleep.  Was out of Lunesta .   Separated yesterday.  Planning on it for a couple of weeks.  Had been coming on for couple mos.   Still very pleased with Caplyta  and feels better than with other meds.  Thinks handling sep better than he would have otherwise.   No SI.  No mania except tried to cut himself a month ago.  Not in suicide attempt.  Will not do that again. Whenever I stick to the program it's ok.   She claims she sees mood swings.    05/03/24 appt noted:  Med: Caplyta  42 mg HS, quetiapine  200 mg HS, Lunesta  3 mg HS, Xanax  0.5 mg TID Reconciled with wife and back together with her 3 weeks.   Overall not markedly depressed.  Not getting as sad as in the past.  Not getting into despair like before.  Anxiety still there with good reason.   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 54 yo.   Praying as a family more.   Since back on Lunesta  after out 3 weeks.  With it is better.   Plan:  Try to reduce and stop Seroquel  wean  over a vouple of weeks.   06/20/24 TC: from CMA: Received a pharmacy interface RF request for Seroquel  300 mg. Called patient to see if he has weaned and he has not. He reports taking Caplyta  42, which he feels is working well.  Takes Lunesta  3 mg, Xanax  0.5, and Seroquel  200 mg at bedtime, but he doesn't stay asleep. He feels like the 300 mg Seroquel  was helpful. Reviewed sleep hygiene with him. He reports being so sleepy during the day from not sleeping all night and he will drink coffee. Told him to cut caffeine off at 2:00. He does watch TV before bed, but he said it is  usually something he doesn't really engage in.   Pt called back to report he didn't have Caplyta  or Seroquel . Called pharmacy to see when he last got fills on both medications. Last filled Caplyta  on 6/5 for 30 day supply, Last filled Seroquel  7/1 for 30-day supply. He has RF available on both. He is still asking to increase Seroquel  dose back to 300 mg.  MD resp: Ok to go back to Seroquel  300 HS if it works for his sleep.  Continue Caplyta  and we will evaluate at next appt .  Continue it if he thinks it might have been more helpful for mood.  Lorene Macintosh, MD, DFAPA   09/15/24 TC:  Pt called, reporting intense anger. Voicing HI - wife and stepson. He has been moving for 3 weeks and having marital discord for a while. He lost some of his medication, had not called us  or the pharmacy for RF. He reports being out of Caplyta  for 2 weeks, costs too much. Taking Xanax , Lunesta , Lamictal  (but reports it isn't working), Seroquel . I repeatedly told him to go to the ER. He said he would think about it, but was going to [facility] now to get RF.  MD resp:  Put on cancellation list  Traci,  He went to ER and was not admitted for HI.  He had run out of Caplyta  for a couple of weeks DT cost.  Please check on him and help him get back on Caplyta .  Ok to use samples if [place]. Lorene Macintosh, MD, DFAPA          09/20/24 appt noted:  Med: out of it lately   Caplyta  42 mg HS but resumed a few days, quetiapine  200 mg HS, Lunesta  3 mg HS, Xanax  0.5 mg TID, lamotrigine  200 mg BID Less SE with Caplyta  than Seroquel . As noted above went to ER with HI and not admitted. Likes the Caplyta  bc less shakiness.  Likes lower dose Seroquel  to help sleep and anxiety.   Been running out of Caplyta . Going through a lot with wife.  In new and smaller place.  Ongoing conflict with her including over stepson. When stopped Seroquel  couldn't sleep.   Had really exaggerated outburst recently and was aware of it. Asks about generic. No hx violence and no HI/SI  since the hospital. Lost Xanax  RX in the move.    11/24/23 appt noted:  Med: Caplyta  42 mg HS, quetiapine  200 mg HS, Lunesta  3 mg HS prn, Xanax  0.5 mg TID, lamotrigine  200 mg BID More consistent with continuing Caplyta .  Last refill was free. No new meds.   Ran out of Xanax  for a few days.  Wants to get it from [facility]. Doing pretty well with mood overall but with New Year stayed up all night and didn't recover well with it.  Through off sleep regiment and just getting back to normal today. Continued conflict with wife at times.  It affected his sleep pattern.  Today is first time getting up.   Still pleased with med regimen and no changes desired.     Past Psychiatric Medication Trials:  Depakote paranoia delusions of parasitosis, topiramate , lamotrigine , Seroquel  800 sedating,   Abilify 20 ,  Vraylar  3 mg restless and couldn't sleep,  Latuda  60 mg with Seroquel  300 mg mania lithium  1200 side effect tremor but helped  Gabapentin NR and SE Nuvigil ,  Wellbutrin , Viibryd,  trazodone 400,  Sonata, amitriptyline, Ambien , Lunesta  worked  propranolol  40-60 mg BID prn anxiety NR  Propranolol  NR for anxiety. Primidone  minimal effect for tremor  Metformin  1000 BID diarrhea no benefit for wt    Review of Systems:  Review of Systems  Constitutional:  Positive for unexpected weight change. Negative for  fatigue.  Cardiovascular:  Negative for palpitations.       Hypertension  Gastrointestinal:  Negative for nausea.  Neurological:  Positive for tremors. Negative for dizziness and weakness.  Psychiatric/Behavioral:  Positive for dysphoric mood and sleep disturbance. Negative for agitation, behavioral problems, confusion, decreased concentration, hallucinations, self-injury and suicidal ideas. The patient is nervous/anxious. The patient is not hyperactive.     Medications: I have reviewed the patient's current medications.  Current Outpatient Medications  Medication Sig Dispense Refill   ALPRAZolam  (XANAX ) 0.5 MG tablet Take 1 tablet (0.5 mg total) by mouth 3 (three) times daily. 15 tablet 0   amLODipine (NORVASC) 10 MG tablet Take 10 mg by mouth daily.     atorvastatin  (LIPITOR) 40 MG tablet Take 40 mg by mouth daily.     Eszopiclone  3 MG TABS TAKE 1 TABLET (3 MG TOTAL) BY MOUTH AT BEDTIME. TAKE 1 TABLET IMMEDIATELY BEFORE BEDTIME 30 tablet 0   ibuprofen  (ADVIL ,MOTRIN ) 400 MG tablet Take 1 tablet (400 mg total) by mouth every 6 (six) hours as needed for moderate pain. 12 tablet 0   lamoTRIgine  (LAMICTAL ) 200 MG tablet Take 1 tablet (200 mg total) by mouth 2 (two) times daily. 180 tablet 1   lumateperone  tosylate (CAPLYTA ) 42 MG capsule Take 1 capsule (42 mg total) by mouth daily. 90 capsule 0   QUEtiapine  (SEROQUEL ) 200 MG tablet Take 1 tablet (200 mg total) by mouth at bedtime. 90 tablet 0   sildenafil (VIAGRA) 100 MG tablet Take by mouth.     valACYclovir (VALTREX) 500 MG tablet Take 500 mg by mouth. Takes for 5 days     No current facility-administered medications for this visit.    Medication Side Effects: None sexual SE are better  Allergies: No Known Allergies  Past Medical History:  Diagnosis Date   Anxiety disorder    Bipolar 1 disorder (HCC)    Hyperlipidemia    Hypertension     History reviewed. No pertinent family history.  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  Tobacco Use   Smoking status: Never   Smokeless tobacco: Never  Substance and Sexual Activity   Alcohol use: Yes   Drug use: No   Sexual activity: Not on file  Other Topics Concern   Not on file  Social History Narrative   Not on file   Social Drivers of Health   Tobacco Use: Low Risk (11/23/2024)   Patient History    Smoking Tobacco Use: Never    Smokeless Tobacco Use: Never    Passive Exposure: Not on file  Financial Resource Strain: Low Risk  (08/06/2023)   Received from [facility]   Overall Financial Resource Strain (CARDIA)    Difficulty of Paying Living Expenses: Not very hard  Food Insecurity: Food Insecurity Present (08/06/2023)   Received from [facility]   Epic    Within the past 12 months, you worried that your food would run out before  you got the money to buy more.: Sometimes true    Within the past 12 months, the food you bought just didn't last and you didn't have money to get more.: Sometimes true  Transportation Needs: No Transportation Needs (08/06/2023)   Received from [facility] - Transportation    In the past 12 months, has lack of transportation kept you from medical appointments or from getting medications?: No    Lack of Transportation (Non-Medical): No  Physical Activity: Not on file  Stress: Not on file  Social Connections: Not on file  Intimate Partner Violence: Not on file  Depression (EYV7-0): Not on file  Alcohol Screen: Not on file  Housing: High Risk (08/11/2024)   Received from [facility]   Epic    In the last 12 months, was there a time when you were not able to pay the mortgage or rent on time?: Yes    In the  past 12 months, how many times have you moved where you were living?: 0    At any time in the past 12 months, were you homeless or living in a shelter (including now)?: Patient unable to answer  Utilities: Not on file  Health Literacy: Not on file    Past Medical History, Surgical history, Social history, and Family history were reviewed and updated as appropriate.   Please see review of systems for further details on the patient's review from today.   Objective:   Physical Exam:  There were no vitals taken for this visit.  Physical Exam Neurological:     Mental Status: He is alert and oriented to person, place, and time.     Cranial Nerves: No dysarthria.  Psychiatric:        Attention and Perception: Attention and perception normal.        Mood and Affect: Mood is anxious. Mood is not depressed. Affect is not labile or tearful.        Speech: Speech normal. Speech is not rapid and pressured, slurred or tangential.        Behavior: Behavior is not slowed. Behavior is cooperative.        Thought Content: Thought content normal. Thought content is not paranoid or delusional. Thought content does not include homicidal or suicidal ideation. Thought content does not include suicidal plan.        Cognition and Memory: Cognition and memory normal.        Judgment: Judgment normal.     Comments: Insight fair to good less mood swings with Caplyta  and less dep but then manic/mixed when ran out of meds.     Lab Review:     Component Value Date/Time   NA 140 05/15/2023 0930   K 4.0 05/15/2023 0930   CL 102 05/15/2023 0930   CO2 24 05/15/2023 0930   GLUCOSE 93 05/15/2023 0930   GLUCOSE 107 (H) 10/06/2019 1534   BUN 6 05/15/2023 0930   CREATININE 1.34 (H) 05/15/2023 0930   CALCIUM  9.9 05/15/2023 0930   PROT 8.7 (H) 07/20/2015 1129   ALBUMIN 4.9 07/20/2015 1129   AST 37 07/20/2015 1129   ALT 51 07/20/2015 1129   ALKPHOS 79 07/20/2015 1129   BILITOT 0.6 07/20/2015 1129   GFRNONAA  59 (L) 10/26/2020 0833   GFRAA 68 10/26/2020 0833       Component Value Date/Time   WBC 3.4 (L) 09/15/2016 0500   RBC 4.37 (L) 09/15/2016 0500   HGB  13.3 09/15/2016 0500   HCT 38.7 (L) 09/15/2016 0500   PLT 242 09/15/2016 0500   MCV 88.6 09/15/2016 0500   MCH 30.4 09/15/2016 0500   MCHC 34.3 09/15/2016 0500   RDW 13.1 09/15/2016 0500   LYMPHSABS 2.3 07/20/2015 1129   MONOABS 0.2 07/20/2015 1129   EOSABS 0.1 07/20/2015 1129   BASOSABS 0.0 07/20/2015 1129    Lithium  Lvl  Date Value Ref Range Status  05/15/2023 0.4 (L) 0.5 - 1.2 mmol/L Final    Comment:    A concentration of 0.5-0.8 mmol/L is advised for long-term use; concentrations of up to 1.2 mmol/L may be necessary during acute treatment.                                  Detection Limit = 0.1                           <0.1 indicates None Detected    05/15/23 lithium  level 0.4 on 1200 dialy. CR 1.34 (stable 4 years), Ca 9.9   06/17/22 Cr 1.5, TSH 1.85, lipids done by PCP   No results found for: PHENYTOIN, PHENOBARB, VALPROATE, CBMZ   .res Assessment: Plan:    Bipolar I disorder, most recent episode (or current) manic (HCC)  Generalized anxiety disorder  Insomnia due to mental condition  Obstructive sleep apnea   30 min video face to face time with patient .  We discussed his diagnoses and answered questions re: bipolar dx, meds, SE.  He's decided lithium  intolerable and stopped early June 2024 resulting in more mood swing.s  Much better mood with Caplyta .    Hx flare up dep and mania with mood swings from running out of meds.  Back on meds now and no HI/SI and better mood now.   Discussed potential metabolic side effects associated with atypical antipsychotics, as well as potential risk for movement side effects. Advised pt to contact office if movement side effects occur.   We discussed the short-term risks associated with benzodiazepines including sedation and increased fall risk among others.   Discussed long-term side effect risk including dependence, potential withdrawal symptoms, and the potential eventual dose-related risk of dementia.  But recent studies from 2020 dispute this association between benzodiazepines and dementia risk. Newer studies in 2020 do not support an association with dementia.  But did discuss risk of affecting memory in daytime. Do not overtake meds nor lose them.  He sometimes gets off .    Counseled patient regarding potential benefits, risks, and side effects of Lamictal  to include potential risk of Stevens-Johnson syndrome. Advised patient to stop taking Lamictal  and contact office immediately if rash develops and to seek urgent medical attention if rash is severe and/or spreading quickly.  Also emphasized the need for conisistency with lamotrigine  dosing otherwise there is an increased risk of rash.  Disc potential effect on B vitamin levels.   Disc increased rash risk with inconsistency. Disc risk DDI with primidone  and if gets more dep or anxious call and will need to increase lamotrigine . Continue lamotrigine  200 mg BID  Watch alcohol.  Hx some excess.  Denies a problem now.  Continue FMLA to have leave for episodic flares of condition intermittent estimated 1-5 days, frequency approximately every 2 mos.  This satisfied his work and needs to be renewed.    Continue lamotrigine  200 mg BID  OK use with  Xanax .    He can take Lunesta  3 mg nightly but he cannot increase the dose above that.  Options caplyta  or Tegretol best ones.  Disc SE continue Caplyta  42 mg HS, very good response .  Staff able to get copay to work so he could get it at no cost.    cannot stop Seroquel  for sleep; continue 200 mg HS lowest dose for sleep. .    Disc recent noncompliance leading to relapse.  Call us  if this happens again.  Be consistent.  Emphasized is   Discussed the risks and in his case necessity of polypharmacy.  He is agreeable to continue the current meds.  We are  gradually improving this.  Residual tremor off lithium .  Option retry propranolol  or primidone  for it.  Plan : no changes Med: Caplyta  42 mg HS, quetiapine  200 mg HS, Lunesta  3 mg HS, Xanax  0.5 mg TID, lamotrigine  200 mg BID  FU 3 mos  Lorene Macintosh, MD, DFAPA   Please see After Visit Summary for patient specific instructions.  No future appointments.      No orders of the defined types were placed in this encounter.       -------------------------------

## 2024-11-24 ENCOUNTER — Telehealth: Payer: Self-pay | Admitting: Psychiatry

## 2024-11-24 DIAGNOSIS — Z0289 Encounter for other administrative examinations: Secondary | ICD-10-CM

## 2024-11-24 NOTE — Telephone Encounter (Signed)
 Pt called and paid $45 for FMLA forms. He will fax them over today

## 2024-11-24 NOTE — Telephone Encounter (Signed)
 FMLA fax received. Given to [facility]

## 2024-11-27 NOTE — Telephone Encounter (Signed)
 Paperwork completed for [employer] renewal-intermittent

## 2024-11-28 ENCOUNTER — Telehealth: Payer: Self-pay | Admitting: Psychiatry

## 2024-11-28 NOTE — Telephone Encounter (Addendum)
 LF 12/14, should not be out yet. Called [facility] and was told that he isn't due for RF yet, but can send Rx to local pharmacy and have them do an override. Pharmacy needs to call [phone] to request an override. LVM to [facility], but also sent MyChart message since he can be difficult to get in touch with.

## 2024-11-28 NOTE — Telephone Encounter (Signed)
 Pt LVM 1/10 @ 4:01p.  He is trying to get a refill of Lunesta  locally from   [id number] [address] [place], [place] - [address] AT [facility] [address] [place], [place] [address] Phone: [phone]  Fax: [phone]     To bridge him over until he can get a script sent in to get it from [facility].  He said he has been out of medication for a few days.  Next appt 4/9

## 2024-11-28 NOTE — Telephone Encounter (Signed)
 Pt left message that he had been out of Lunesta  for a few days. Last filled 12/14 and should not have been out of medication. He has been cautioned not to run out of medication. Per [facility] he isn't due for a RF until 1/22. He had moved and lost medication and Rx sent in. He doesn't have a clear cut explanation for being out now, said the lid comes off his bottle and he loses it. Discussed with him his responsibility to have a better system in place for accountability of meds. I reviewed with him 3 times that Lunesta  is a controlled substance and that we, as well as the pharmacy, are held accountable, and that we couldn't just send in a RF any time requested.  He is asking for a bridge to his local pharmacy. He said if he doesn't get a RF he will just be sick puppy. I did not pend a RF at this time.   11/23/2024 11/23/2024 1  Alprazolam  0.5 Mg Tablet 90.00 30 Ca Cot [id number] Nor (5896) 0/0 3.00 LME Comm Ins [place] 10/30/2024 10/24/2024 1  Eszopiclone  3 Mg Tablet 30.00 30 Ca Cot [id number] Nor (5896) 0/0 1.00 LME Comm Ins [place] 10/05/2024 05/03/2024 2  Alprazolam  0.5 Mg Tablet 90.00 30 Ca Cot [id number] Exp (4317) 1/5 3.00 LME Comm Ins [place] 09/26/2024 09/20/2024 1  Eszopiclone  3 Mg Tablet 30.00 30 Ca Cot [id number] Nor (5896) 0/0 1.00 LME Comm Ins [place] 09/18/2024 09/18/2024 1  Alprazolam  0.5 Mg Tablet 15.00 5 Ca Cot [id number] Nor (5896) 0/0 3.00 LME Comm Ins  07/21/2024 05/03/2024 1  Eszopiclone  3 Mg Tablet 90.00 90

## 2024-11-28 NOTE — Telephone Encounter (Signed)
 FMLA faxed.

## 2024-11-29 ENCOUNTER — Other Ambulatory Visit: Payer: Self-pay | Admitting: Psychiatry

## 2024-11-29 DIAGNOSIS — F5105 Insomnia due to other mental disorder: Secondary | ICD-10-CM

## 2024-11-29 MED ORDER — ESZOPICLONE 3 MG PO TABS: 3.0000 mg | ORAL_TABLET | Freq: Every day | ORAL | 0 refills | Status: AC

## 2024-11-29 NOTE — Telephone Encounter (Signed)
 According to PDMP, rx due tomorrow.  Sent to local pharmacy

## 2024-12-02 ENCOUNTER — Telehealth: Payer: Self-pay | Admitting: Psychiatry

## 2024-12-02 NOTE — Telephone Encounter (Signed)
 Received FMLA forms to confirm continuous leave of 1/5-1/9. Pt states he was out of meds at the time. Saw Dr. Geoffry 1/7 and it still took till the 9th for him to get back regulated. States he thinks he told Dr. Geoffry about being out but is not sure. Can you confirm that Dr. Geoffry will write him out for that time frame?

## 2024-12-02 NOTE — Telephone Encounter (Signed)
 Pt called back stating company sent wrong form so please disregard prev msg. Will give update when new form is received.

## 2024-12-17 ENCOUNTER — Other Ambulatory Visit: Payer: Self-pay | Admitting: Psychiatry

## 2024-12-19 ENCOUNTER — Telehealth: Payer: Self-pay | Admitting: Psychiatry

## 2024-12-19 NOTE — Telephone Encounter (Signed)
 Noted will update and fax when able to get back to office.

## 2024-12-19 NOTE — Telephone Encounter (Signed)
 Spoke to [place] and he would like his med sent to [place] [place] [facility]

## 2024-12-21 NOTE — Telephone Encounter (Signed)
 Updated per Andree with admin staff

## 2025-02-23 ENCOUNTER — Telehealth: Admitting: Psychiatry
